# Patient Record
Sex: Female | Born: 1935 | Race: White | Hispanic: No | Smoking: Current every day smoker
Health system: Southern US, Community
[De-identification: ages and names within clinical notes are randomized; demographics above are authoritative.]

## PROBLEM LIST (undated history)

## (undated) DIAGNOSIS — H269 Unspecified cataract: Secondary | ICD-10-CM

## (undated) DIAGNOSIS — D649 Anemia, unspecified: Secondary | ICD-10-CM

## (undated) DIAGNOSIS — R768 Other specified abnormal immunological findings in serum: Secondary | ICD-10-CM

## (undated) DIAGNOSIS — K635 Polyp of colon: Secondary | ICD-10-CM

## (undated) DIAGNOSIS — M75101 Unspecified rotator cuff tear or rupture of right shoulder, not specified as traumatic: Secondary | ICD-10-CM

## (undated) DIAGNOSIS — J449 Chronic obstructive pulmonary disease, unspecified: Secondary | ICD-10-CM

## (undated) DIAGNOSIS — J479 Bronchiectasis, uncomplicated: Secondary | ICD-10-CM

## (undated) DIAGNOSIS — M4850XA Collapsed vertebra, not elsewhere classified, site unspecified, initial encounter for fracture: Secondary | ICD-10-CM

## (undated) DIAGNOSIS — Z8679 Personal history of other diseases of the circulatory system: Secondary | ICD-10-CM

## (undated) DIAGNOSIS — Z9889 Other specified postprocedural states: Secondary | ICD-10-CM

## (undated) DIAGNOSIS — R0902 Hypoxemia: Secondary | ICD-10-CM

## (undated) DIAGNOSIS — E7800 Pure hypercholesterolemia, unspecified: Secondary | ICD-10-CM

## (undated) DIAGNOSIS — E119 Type 2 diabetes mellitus without complications: Secondary | ICD-10-CM

## (undated) HISTORY — DX: Pure hypercholesterolemia, unspecified: E78.00

## (undated) HISTORY — DX: Unspecified rotator cuff tear or rupture of right shoulder, not specified as traumatic: M75.101

## (undated) HISTORY — DX: Polyp of colon: K63.5

## (undated) HISTORY — DX: Hypoxemia: R09.02

## (undated) HISTORY — PX: ROTATOR CUFF REPAIR: SHX139

## (undated) HISTORY — DX: Bronchiectasis, uncomplicated: J47.9

## (undated) HISTORY — PX: APPENDECTOMY: SHX54

## (undated) HISTORY — DX: Other specified postprocedural states: Z86.79

## (undated) HISTORY — DX: Other specified abnormal immunological findings in serum: R76.8

## (undated) HISTORY — DX: Other specified postprocedural states: Z98.890

## (undated) HISTORY — PX: ABDOMINAL HYSTERECTOMY: SHX81

## (undated) HISTORY — DX: Type 2 diabetes mellitus without complications: E11.9

## (undated) HISTORY — DX: Collapsed vertebra, not elsewhere classified, site unspecified, initial encounter for fracture: M48.50XA

## (undated) HISTORY — DX: Anemia, unspecified: D64.9

## (undated) HISTORY — DX: Chronic obstructive pulmonary disease, unspecified: J44.9

## (undated) HISTORY — DX: Unspecified cataract: H26.9

## (undated) HISTORY — PX: ABDOMINAL AORTIC ANEURYSM REPAIR: SUR1152

---

## 2004-06-01 ENCOUNTER — Ambulatory Visit: Payer: Self-pay

## 2004-07-13 ENCOUNTER — Ambulatory Visit: Payer: Self-pay

## 2005-04-11 ENCOUNTER — Ambulatory Visit: Payer: Self-pay | Admitting: Internal Medicine

## 2005-06-14 ENCOUNTER — Ambulatory Visit: Payer: Self-pay | Admitting: Internal Medicine

## 2005-06-28 ENCOUNTER — Ambulatory Visit: Payer: Self-pay | Admitting: Internal Medicine

## 2005-07-16 ENCOUNTER — Ambulatory Visit: Payer: Self-pay | Admitting: Internal Medicine

## 2005-10-23 ENCOUNTER — Ambulatory Visit: Payer: Self-pay | Admitting: Internal Medicine

## 2005-12-04 ENCOUNTER — Ambulatory Visit: Payer: Self-pay | Admitting: Ophthalmology

## 2006-01-14 ENCOUNTER — Ambulatory Visit: Payer: Self-pay | Admitting: Ophthalmology

## 2006-01-30 ENCOUNTER — Ambulatory Visit: Payer: Self-pay | Admitting: Internal Medicine

## 2006-02-09 ENCOUNTER — Other Ambulatory Visit: Payer: Self-pay

## 2006-02-09 ENCOUNTER — Emergency Department: Payer: Self-pay | Admitting: Emergency Medicine

## 2006-02-12 ENCOUNTER — Ambulatory Visit: Payer: Self-pay | Admitting: Internal Medicine

## 2006-02-13 ENCOUNTER — Other Ambulatory Visit: Payer: Self-pay

## 2006-02-13 ENCOUNTER — Inpatient Hospital Stay: Payer: Self-pay | Admitting: Internal Medicine

## 2006-02-17 ENCOUNTER — Emergency Department: Payer: Self-pay | Admitting: Unknown Physician Specialty

## 2006-02-17 ENCOUNTER — Other Ambulatory Visit: Payer: Self-pay

## 2006-02-18 ENCOUNTER — Ambulatory Visit: Payer: Self-pay | Admitting: Unknown Physician Specialty

## 2006-10-06 ENCOUNTER — Other Ambulatory Visit: Payer: Self-pay

## 2006-10-07 ENCOUNTER — Inpatient Hospital Stay: Payer: Self-pay | Admitting: Internal Medicine

## 2007-01-21 ENCOUNTER — Ambulatory Visit: Payer: Self-pay | Admitting: Internal Medicine

## 2007-01-26 ENCOUNTER — Ambulatory Visit: Payer: Self-pay | Admitting: Internal Medicine

## 2007-03-18 ENCOUNTER — Ambulatory Visit: Payer: Self-pay | Admitting: Specialist

## 2007-12-30 ENCOUNTER — Other Ambulatory Visit: Payer: Self-pay

## 2007-12-30 ENCOUNTER — Inpatient Hospital Stay: Payer: Self-pay | Admitting: Specialist

## 2008-01-22 ENCOUNTER — Ambulatory Visit: Payer: Self-pay | Admitting: Internal Medicine

## 2008-01-27 ENCOUNTER — Ambulatory Visit: Payer: Self-pay | Admitting: Internal Medicine

## 2008-06-22 ENCOUNTER — Inpatient Hospital Stay: Payer: Self-pay | Admitting: Specialist

## 2008-07-21 ENCOUNTER — Ambulatory Visit: Payer: Self-pay | Admitting: Specialist

## 2008-07-27 ENCOUNTER — Ambulatory Visit: Payer: Self-pay | Admitting: Specialist

## 2008-11-08 ENCOUNTER — Ambulatory Visit: Payer: Self-pay | Admitting: Internal Medicine

## 2009-02-08 ENCOUNTER — Ambulatory Visit: Payer: Self-pay | Admitting: Internal Medicine

## 2009-04-03 ENCOUNTER — Ambulatory Visit: Payer: Self-pay | Admitting: Urology

## 2009-12-21 ENCOUNTER — Inpatient Hospital Stay: Payer: Self-pay | Admitting: Internal Medicine

## 2010-04-04 ENCOUNTER — Ambulatory Visit: Payer: Self-pay | Admitting: Internal Medicine

## 2010-09-23 ENCOUNTER — Ambulatory Visit: Payer: Self-pay

## 2011-04-17 ENCOUNTER — Ambulatory Visit: Payer: Self-pay | Admitting: Internal Medicine

## 2011-07-02 LAB — HM MAMMOGRAPHY

## 2011-07-31 ENCOUNTER — Ambulatory Visit: Payer: Self-pay | Admitting: Internal Medicine

## 2012-05-18 ENCOUNTER — Ambulatory Visit: Payer: Self-pay | Admitting: Internal Medicine

## 2012-06-10 ENCOUNTER — Ambulatory Visit: Payer: Self-pay | Admitting: Internal Medicine

## 2012-06-30 ENCOUNTER — Encounter: Payer: Self-pay | Admitting: *Deleted

## 2012-06-30 ENCOUNTER — Other Ambulatory Visit: Payer: Self-pay | Admitting: *Deleted

## 2012-06-30 DIAGNOSIS — E119 Type 2 diabetes mellitus without complications: Secondary | ICD-10-CM

## 2012-06-30 DIAGNOSIS — J449 Chronic obstructive pulmonary disease, unspecified: Secondary | ICD-10-CM

## 2012-06-30 DIAGNOSIS — J479 Bronchiectasis, uncomplicated: Secondary | ICD-10-CM

## 2012-06-30 DIAGNOSIS — M810 Age-related osteoporosis without current pathological fracture: Secondary | ICD-10-CM

## 2012-06-30 DIAGNOSIS — Z86010 Personal history of colonic polyps: Secondary | ICD-10-CM

## 2012-06-30 DIAGNOSIS — E7800 Pure hypercholesterolemia, unspecified: Secondary | ICD-10-CM

## 2012-06-30 DIAGNOSIS — D649 Anemia, unspecified: Secondary | ICD-10-CM

## 2012-07-01 ENCOUNTER — Ambulatory Visit (INDEPENDENT_AMBULATORY_CARE_PROVIDER_SITE_OTHER): Payer: Medicare Other | Admitting: Internal Medicine

## 2012-07-01 ENCOUNTER — Inpatient Hospital Stay: Payer: Self-pay | Admitting: Internal Medicine

## 2012-07-01 ENCOUNTER — Encounter: Payer: Self-pay | Admitting: Internal Medicine

## 2012-07-01 ENCOUNTER — Encounter: Payer: Self-pay | Admitting: *Deleted

## 2012-07-01 VITALS — BP 118/68 | HR 92 | Temp 98.1°F | Ht 61.0 in | Wt 120.2 lb

## 2012-07-01 DIAGNOSIS — J449 Chronic obstructive pulmonary disease, unspecified: Secondary | ICD-10-CM | POA: Insufficient documentation

## 2012-07-01 DIAGNOSIS — J479 Bronchiectasis, uncomplicated: Secondary | ICD-10-CM

## 2012-07-01 DIAGNOSIS — D649 Anemia, unspecified: Secondary | ICD-10-CM

## 2012-07-01 DIAGNOSIS — E7800 Pure hypercholesterolemia, unspecified: Secondary | ICD-10-CM

## 2012-07-01 DIAGNOSIS — E119 Type 2 diabetes mellitus without complications: Secondary | ICD-10-CM | POA: Insufficient documentation

## 2012-07-01 DIAGNOSIS — Z860100 Personal history of colon polyps, unspecified: Secondary | ICD-10-CM

## 2012-07-01 DIAGNOSIS — R0989 Other specified symptoms and signs involving the circulatory and respiratory systems: Secondary | ICD-10-CM

## 2012-07-01 DIAGNOSIS — M810 Age-related osteoporosis without current pathological fracture: Secondary | ICD-10-CM

## 2012-07-01 LAB — CBC WITH DIFFERENTIAL/PLATELET
Eosinophil #: 0 10*3/uL (ref 0.0–0.7)
Eosinophil %: 0.5 %
HCT: 40.9 % (ref 35.0–47.0)
Lymphocyte #: 0.7 10*3/uL — ABNORMAL LOW (ref 1.0–3.6)
MCV: 91 fL (ref 80–100)
Monocyte %: 5 %
Neutrophil #: 7.7 10*3/uL — ABNORMAL HIGH (ref 1.4–6.5)
RBC: 4.49 10*6/uL (ref 3.80–5.20)
WBC: 9 10*3/uL (ref 3.6–11.0)

## 2012-07-01 LAB — BASIC METABOLIC PANEL
Anion Gap: 4 — ABNORMAL LOW (ref 7–16)
Calcium, Total: 9.2 mg/dL (ref 8.5–10.1)
Co2: 29 mmol/L (ref 21–32)
EGFR (African American): >60
Osmolality: 284 (ref 275–301)
Potassium: 3.7 mmol/L (ref 3.5–5.1)
Sodium: 140 mmol/L (ref 136–145)

## 2012-07-01 MED ORDER — ALBUTEROL SULFATE (2.5 MG/3ML) 0.083% IN NEBU
2.5000 mg | INHALATION_SOLUTION | Freq: Once | RESPIRATORY_TRACT | Status: AC
  Administered 2012-07-01: 2.5 mg via RESPIRATORY_TRACT

## 2012-07-02 LAB — CBC WITH DIFFERENTIAL/PLATELET
Basophil #: 0 10*3/uL (ref 0.0–0.1)
Basophil %: 0.2 %
Eosinophil #: 0 10*3/uL (ref 0.0–0.7)
Eosinophil %: 0 %
Lymphocyte #: 0.5 10*3/uL — ABNORMAL LOW (ref 1.0–3.6)
MCH: 32.8 pg (ref 26.0–34.0)
Monocyte #: 0.2 x10 3/mm (ref 0.2–0.9)
Monocyte %: 1.8 %
Neutrophil %: 92.1 %
Platelet: 191 10*3/uL (ref 150–440)
RBC: 4.26 10*6/uL (ref 3.80–5.20)
WBC: 8.4 10*3/uL (ref 3.6–11.0)

## 2012-07-02 LAB — BASIC METABOLIC PANEL
BUN: 11 mg/dL (ref 7–18)
Chloride: 106 mmol/L (ref 98–107)
Co2: 23 mmol/L (ref 21–32)
Creatinine: 0.69 mg/dL (ref 0.60–1.30)
EGFR (African American): >60
EGFR (Non-African Amer.): >60
Glucose: 183 mg/dL — ABNORMAL HIGH (ref 65–99)
Osmolality: 278 (ref 275–301)
Potassium: 4.1 mmol/L (ref 3.5–5.1)
Sodium: 137 mmol/L (ref 136–145)

## 2012-07-02 LAB — PRO B NATRIURETIC PEPTIDE: B-Type Natriuretic Peptide: 120 pg/mL (ref 0–450)

## 2012-07-02 LAB — HEMOGLOBIN A1C: Hemoglobin A1C: 6.7 % — ABNORMAL HIGH (ref 4.2–6.3)

## 2012-07-10 ENCOUNTER — Encounter: Payer: Self-pay | Admitting: Adult Health

## 2012-07-10 ENCOUNTER — Encounter: Payer: Self-pay | Admitting: Internal Medicine

## 2012-07-10 ENCOUNTER — Ambulatory Visit (INDEPENDENT_AMBULATORY_CARE_PROVIDER_SITE_OTHER): Payer: Medicare Other | Admitting: Adult Health

## 2012-07-10 VITALS — BP 143/80 | HR 101 | Temp 98.0°F | Ht 62.0 in | Wt 123.0 lb

## 2012-07-10 DIAGNOSIS — J449 Chronic obstructive pulmonary disease, unspecified: Secondary | ICD-10-CM

## 2012-07-10 DIAGNOSIS — B370 Candidal stomatitis: Secondary | ICD-10-CM

## 2012-07-10 MED ORDER — NYSTATIN 100000 UNIT/ML MT SUSP: 500000.0000 [IU] | Freq: Four times a day (QID) | OROMUCOSAL | Status: DC

## 2012-07-10 NOTE — Assessment & Plan Note (Signed)
Pt presents with increased cough, congestion, wheezing and sob.  COPD exacerbation and concern over possible overlying infection.  Not responding to outpatient abx, prednisone and current inhaler/neb regimen.  I do feel she warrants hospitalization.  Discussed with the patient and her daughter.  Also discussed with the hospitalist and they agreed to accept the patient for a direct admission.  Pt subsequently send over for direct admission.

## 2012-07-10 NOTE — Progress Notes (Signed)
Subjective:    Patient ID: Carla Harris, female    DOB: 1928-11-04, 78 y.o.   MRN: [id number]  HPI 78 year old female with past history of COPD/bronchiectasis, anemia and hypercholesterolemia who comes in today for a scheduled follow up.  She states that starting 10 days ago, she noticed worsening respiratory symptoms.  She sees Dr Meredeth Ide regularly.  She has recently been on abx.  Also started Daliresp.  Is no Prednisone 20mg  daily now.  She is due to follow up with Dr Meredeth Ide on 07/13/12.  She is still having increased cough and wheezing.  Increased sob.  Feels tight, especially in the am.  Has already used her nebulizer twice this am.  Has been using this several times per day.  She states she is tired.  Some increased anxiety with the increased sob.  No nausea or vomiting.   Past Medical History  Diagnosis Date  . Anemia   . Diabetes mellitus without complication   . Bronchiectasis   . Colon polyps   . Hypercholesteremia   . S/P AAA repair   . Right rotator cuff tear   . COPD (chronic obstructive pulmonary disease)   . Low serum IgG1 and IgM levels     Current Outpatient Prescriptions on File Prior to Visit  Medication Sig Dispense Refill  . acetaminophen (TYLENOL) 500 MG tablet Take 500 mg by mouth every 6 (six) hours as needed.      Marland Kitchen albuterol (PROVENTIL HFA;VENTOLIN HFA) 108 (90 BASE) MCG/ACT inhaler Inhale 1 puff into the lungs every 6 (six) hours as needed.      Marland Kitchen albuterol-ipratropium (COMBIVENT) 18-103 MCG/ACT inhaler Inhale 2 puffs into the lungs every 6 (six) hours as needed.      Marland Kitchen aspirin EC 81 MG tablet Take 81 mg by mouth daily.      . cetirizine (ZYRTEC) 10 MG tablet Take 10 mg by mouth daily.      . Cholecalciferol (VITAMIN D3) 2000 UNITS capsule Take 2,000 Units by mouth daily.      Marland Kitchen esomeprazole (NEXIUM) 40 MG capsule Take 40 mg by mouth daily before breakfast. Take 1 capsule twice a day      . Fluticasone-Salmeterol (ADVAIR) 250-50 MCG/DOSE AEPB Inhale 1 puff into  the lungs every 12 (twelve) hours.      . metFORMIN (GLUCOPHAGE) 500 MG tablet Take 500 mg by mouth 2 (two) times daily with a meal. Two times a day with meal.      . montelukast (SINGULAIR) 10 MG tablet Take 10 mg by mouth at bedtime.      . pravastatin (PRAVACHOL) 20 MG tablet Take 20 mg by mouth daily.      . theophylline (THEODUR) 200 MG 12 hr tablet Take 200 mg by mouth 2 (two) times daily.        Review of Systems Patient denies any headache, lightheadedness or dizziness.  No significant sinus or allergy symptoms.  No chest pain.  She does report the increased chest tightness and sob.  Increased wheezing.  Increased cough and congestion.  Symptoms worse in the morning. Has already been on abx and is taking prednisone daily.  No nausea or vomiting.  No abdominal pain or cramping.  No bowel change, such as diarrhea, constipation, BRBPR or melana.  No urine change.  Some anxiety especially notices when she is having difficulty breathing.      Objective:   Physical Exam Filed Vitals:   07/01/12 1116  BP: 118/68  Pulse: 92  Temp: 98.1 F (18.28 C)   78 year old female in mild respiratory distress.   HEENT:  Nares - clear.  OP- without lesions or erythema.  NECK:  Supple, nontender.     HEART:  Appears to be regular. LUNGS:  Respirations slightly labored.  Increased congestion.  Increased cough with expiration and especially with forced expiration.  Increased wheezing.    RADIAL PULSE:  Equal bilaterally.  ABDOMEN:  Soft, nontender.     EXTREMITIES:  No increased edema to be present.                     Assessment & Plan:  SMOKING CESSATION.  Again discussed with her today the need to quit.  She desires not to quit at this time.    PREVIOUS WEIGHT LOSS.  Weight last visit 124.  Today 120.  Treat the current flare.  Follow.  Has declined further w/up.   CARDIOVASCULAR.  Stable.    INCREASED PSYCHOSOCIAL STRESSORS.  Feels she is handling things relatively well.  Follow.    HISTORY  OF AAA REPAIR.  Last ultrasound 2010.  Dr Maye Hides reviewed.  He felt no further w/up for several years - then follow up with ultrasound.  (phone Dr Maye Hides - [phone]).    HEALTH MAINTENANCE.  Physical 12/05/11.  She declined GU and rectal exam.  Declined GI evaluation.  Will notify me when agreeable for bone density.  Mammogram 04/17/11 - BiRads II.

## 2012-07-10 NOTE — Progress Notes (Signed)
Subjective:    Patient ID: Carla Harris, female    DOB: 1929-01-11, 78 y.o.   MRN: [id number]  HPI  Patient is a pleasant 78 y/o patient with hx of COPD, bronchiectasis, HLD, DM type II, who presents to clinic today for f/u of recent hospitalization at [facility] from 06/29/12 - 07/03/12 for acute on chronic respiratory failure 2/2 COPD exacerbation. She reports feeling well. She has a chronic cough. She reports continued tobacco abuse and has weaned herself down to 6 cigarettes daily. She is currently on O2 nasal cannula and reports mainly using it during the night and when she goes out. She experience shortness of breath with exertion. She denies CP, fever, chills.   Current Outpatient Prescriptions on File Prior to Visit  Medication Sig Dispense Refill  . acetaminophen (TYLENOL) 500 MG tablet Take 500 mg by mouth every 6 (six) hours as needed.      Marland Kitchen albuterol (PROVENTIL HFA;VENTOLIN HFA) 108 (90 BASE) MCG/ACT inhaler Inhale 1 puff into the lungs every 6 (six) hours as needed.      Marland Kitchen albuterol-ipratropium (COMBIVENT) 18-103 MCG/ACT inhaler Inhale 2 puffs into the lungs every 6 (six) hours as needed.      Marland Kitchen aspirin EC 81 MG tablet Take 81 mg by mouth daily.      . cetirizine (ZYRTEC) 10 MG tablet Take 10 mg by mouth daily.      . Cholecalciferol (VITAMIN D3) 2000 UNITS capsule Take 2,000 Units by mouth daily.      Marland Kitchen esomeprazole (NEXIUM) 40 MG capsule Take 40 mg by mouth daily before breakfast. Take 1 capsule twice a day      . Fluticasone-Salmeterol (ADVAIR) 250-50 MCG/DOSE AEPB Inhale 1 puff into the lungs every 12 (twelve) hours.      . metFORMIN (GLUCOPHAGE) 500 MG tablet Take 500 mg by mouth 2 (two) times daily with a meal. Two times a day with meal.      . montelukast (SINGULAIR) 10 MG tablet Take 10 mg by mouth at bedtime.      . pravastatin (PRAVACHOL) 20 MG tablet Take 20 mg by mouth daily.      . predniSONE (DELTASONE) 20 MG tablet Take 20 mg by mouth daily. Take 3 tablets daily      .  theophylline (THEODUR) 200 MG 12 hr tablet Take 200 mg by mouth 2 (two) times daily.          Review of Systems  Constitutional: Negative for fever, chills, appetite change and fatigue.  HENT: Positive for sore throat.   Eyes: Negative.   Respiratory: Positive for cough, shortness of breath and wheezing.   Cardiovascular: Negative for chest pain.  Gastrointestinal: Negative for nausea, vomiting and diarrhea.  Genitourinary: Negative for dysuria and flank pain.  Skin: Negative for rash.  Neurological: Positive for headaches. Negative for dizziness, weakness and light-headedness.  Psychiatric/Behavioral: Negative.      BP 143/80  Pulse 101  Temp 98 F (36.7 C) (Oral)  Ht 5\' 2"  (1.575 m)  Wt 123 lb (55.792 kg)  BMI 22.50 kg/m2  SpO2 98%  LMP 07/01/1969     Objective:   Physical Exam  Constitutional: She is oriented to person, place, and time. She appears well-developed and well-nourished. No distress.  HENT:  Head: Normocephalic.       Oropharyngeal thrush  Neck: No tracheal deviation present.  Cardiovascular: Normal rate, regular rhythm and normal heart sounds.   No murmur heard. Pulmonary/Chest: No respiratory distress. She has wheezes.  Rhonchi posterior bilateral upper fields  Abdominal: Bowel sounds are normal. She exhibits no distension. There is no tenderness.  Lymphadenopathy:    She has no cervical adenopathy.  Neurological: She is alert and oriented to person, place, and time. Coordination normal.  Skin: Skin is warm and dry. No rash noted.  Psychiatric: She has a normal mood and affect. Her behavior is normal. Thought content normal.       Assessment & Plan:

## 2012-07-10 NOTE — Assessment & Plan Note (Addendum)
Low cholesterol diet and exercise.  Check lipid profile.   

## 2012-07-10 NOTE — Assessment & Plan Note (Signed)
Declines GI evaluation.   

## 2012-07-10 NOTE — Assessment & Plan Note (Signed)
Recent hospitalization for acute on chronic respiratory failure secondary to COPD exacerbation. Discharge medications reviewed and updated. Oxygen currently in use. Patient is still smoking. Advised to not smoke with oxygen in use. Patient is currently taking Prednisone 60 mg daily. Instructed not to stop this medication abruptly. She will be following with her pulmonologist, Dr. Meredeth Ide, on Monday.

## 2012-07-10 NOTE — Assessment & Plan Note (Signed)
Sugars have been well controlled.  Will need follow up met b and a1c.   

## 2012-07-10 NOTE — Assessment & Plan Note (Signed)
Declines follow up bone density.  Continue calcium and vitamin D.   

## 2012-07-10 NOTE — Assessment & Plan Note (Signed)
Is followed by Dr Meredeth Ide.  Treat current COPD flare as outlined.

## 2012-07-10 NOTE — Assessment & Plan Note (Signed)
Suspect this is from oral steroid inhaler. Patient has not been rinsing mouth. Instructed importance of rinsing thoroughly after each use. Start Nystatin swish and swallow.

## 2012-07-10 NOTE — Patient Instructions (Signed)
  Follow your new medication list you were sent from the hospital with.   Take the prednisone 20mg  tablet (3 tablets daily). Take this medication in the morning so that it does not affect your sleep. You have 10 mg tablets so you will need to take 6 tablets. This medication cannot be stopped abruptly.  Rinse your mouth thoroughly after using Advair. You have some thrush from the inhaler. I am ordering some nystatin swish and swallow to help with the thrush.

## 2012-07-10 NOTE — Assessment & Plan Note (Signed)
Follow cbc.  

## 2012-07-13 ENCOUNTER — Other Ambulatory Visit: Payer: Self-pay | Admitting: Internal Medicine

## 2012-07-13 NOTE — Telephone Encounter (Signed)
Alprazolam 0.25 mg tab   Take 1 tablet twice daily as needed  # 40

## 2012-07-14 MED ORDER — ALPRAZOLAM 0.25 MG PO TABS: 0.2500 mg | ORAL_TABLET | Freq: Two times a day (BID) | ORAL | Status: DC | PRN

## 2012-07-14 NOTE — Telephone Encounter (Signed)
Prescription called in to pharmacy

## 2012-09-14 ENCOUNTER — Encounter: Payer: Self-pay | Admitting: Internal Medicine

## 2012-09-14 ENCOUNTER — Ambulatory Visit (INDEPENDENT_AMBULATORY_CARE_PROVIDER_SITE_OTHER): Payer: Medicare Other | Admitting: Internal Medicine

## 2012-09-14 VITALS — BP 128/68 | HR 97 | Temp 98.6°F | Wt 123.0 lb

## 2012-09-14 DIAGNOSIS — R5381 Other malaise: Secondary | ICD-10-CM

## 2012-09-14 DIAGNOSIS — Z860100 Personal history of colon polyps, unspecified: Secondary | ICD-10-CM

## 2012-09-14 DIAGNOSIS — J449 Chronic obstructive pulmonary disease, unspecified: Secondary | ICD-10-CM

## 2012-09-14 DIAGNOSIS — E7800 Pure hypercholesterolemia, unspecified: Secondary | ICD-10-CM

## 2012-09-14 DIAGNOSIS — J479 Bronchiectasis, uncomplicated: Secondary | ICD-10-CM

## 2012-09-14 DIAGNOSIS — E119 Type 2 diabetes mellitus without complications: Secondary | ICD-10-CM

## 2012-09-14 DIAGNOSIS — D649 Anemia, unspecified: Secondary | ICD-10-CM

## 2012-09-14 DIAGNOSIS — M810 Age-related osteoporosis without current pathological fracture: Secondary | ICD-10-CM

## 2012-09-14 MED ORDER — AZITHROMYCIN 250 MG PO TABS: ORAL_TABLET | ORAL | Status: DC

## 2012-09-14 MED ORDER — PREDNISONE 10 MG PO TABS: ORAL_TABLET | ORAL | Status: DC

## 2012-09-15 ENCOUNTER — Encounter: Payer: Self-pay | Admitting: Internal Medicine

## 2012-09-15 NOTE — Assessment & Plan Note (Signed)
Low cholesterol diet and exercise.  Check lipid profile.

## 2012-09-15 NOTE — Assessment & Plan Note (Signed)
Is followed by Dr Meredeth Ide.  Treat current infection with Zpak as directed.  Prednisone taper as directed.

## 2012-09-15 NOTE — Assessment & Plan Note (Signed)
Treat current infection with a Zpak as directed. Prednisone taper as directed.  Continue nebs and inhalers as she is doing.  Continue follow up with Dr Meredeth Ide.  She knows to continue her daily prednisone after she completes the taper.

## 2012-09-15 NOTE — Progress Notes (Signed)
Subjective:    Patient ID: Carla Harris, female    DOB: August 28, 1928, 79 y.o.   MRN: [id number]  Cough  79 year old female with past history of COPD/bronchiectasis, anemia and hypercholesterolemia who comes in today for a scheduled follow up.  She reports that over the last two weeks, she has noticed increased cough and congestion.  Hoarseness.  Ears roaring at times.  Some increased chest congestion.  Not as tight.  Has been using her nebulizer at home.  Saw Dr Meredeth Ide last week.  Started on Performist.  Still with symptoms despite frequent neb treatments.  States she is eating and drinking relatively well.  No nausea or vomiting.  No bowel change.  Sugars have done well.     Past Medical History  Diagnosis Date  . Anemia   . Diabetes mellitus without complication   . Bronchiectasis   . Colon polyps   . Hypercholesteremia   . S/P AAA repair   . Right rotator cuff tear   . COPD (chronic obstructive pulmonary disease)   . Low serum IgG1 and IgM levels     Current Outpatient Prescriptions on File Prior to Visit  Medication Sig Dispense Refill  . albuterol-ipratropium (COMBIVENT) 18-103 MCG/ACT inhaler Inhale 2 puffs into the lungs every 6 (six) hours as needed.      . ALPRAZolam (XANAX) 0.25 MG tablet Take 1 tablet (0.25 mg total) by mouth 2 (two) times daily as needed.  40 tablet  0  . aspirin EC 81 MG tablet Take 81 mg by mouth daily.      . cetirizine (ZYRTEC) 10 MG tablet Take 10 mg by mouth daily.      . Cholecalciferol (VITAMIN D3) 2000 UNITS capsule Take 2,000 Units by mouth daily.      Marland Kitchen esomeprazole (NEXIUM) 40 MG capsule Take 40 mg by mouth daily before breakfast. Take 1 capsule twice a day      . ipratropium-albuterol (DUONEB) 0.5-2.5 (3) MG/3ML SOLN Take 3 mLs by nebulization every 6 (six) hours as needed.      . metFORMIN (GLUCOPHAGE) 500 MG tablet Take 500 mg by mouth 2 (two) times daily with a meal. Two times a day with meal.      . montelukast (SINGULAIR) 10 MG tablet Take  10 mg by mouth at bedtime.      Marland Kitchen nystatin (MYCOSTATIN) 100000 UNIT/ML suspension Take 5 mLs (500,000 Units total) by mouth 4 (four) times daily.  60 mL  0  . pravastatin (PRAVACHOL) 20 MG tablet Take 20 mg by mouth daily.      . predniSONE (DELTASONE) 20 MG tablet Take 20 mg by mouth daily. Take 3 tablets daily      . theophylline (THEODUR) 200 MG 12 hr tablet Take 200 mg by mouth 2 (two) times daily.      Marland Kitchen acetaminophen (TYLENOL) 500 MG tablet Take 500 mg by mouth every 6 (six) hours as needed.      . Fluticasone-Salmeterol (ADVAIR) 250-50 MCG/DOSE AEPB Inhale 1 puff into the lungs every 12 (twelve) hours.       No current facility-administered medications on file prior to visit.    Review of Systems  Respiratory: Positive for cough.   Patient denies any headache, lightheadedness or dizziness.  Reports some nasal congestion and roaring in her ears.  No chest pain.  She does report the increased chest congestion and cough.  Some wheezing.   Using her nebs regularly.  No nausea or vomiting.  No  abdominal pain or cramping.  No bowel change, such as diarrhea, constipation, BRBPR or melana.  No urine change.       Objective:   Physical Exam  Filed Vitals:   09/14/12 0901  BP: 128/68  Pulse: 97  Temp: 98.6 F (4 C)   79 year old female in no acute distress.    HEENT:  Nares - clear - slightly erythematous turbinates.  OP- without lesions or erythema.  TMs visualized - without erythema.  NECK:  Supple, nontender.     HEART:  Appears to be regular. LUNGS:  Increased air movement.  Some increased congestion - cleared mostly with coughing. Some increased cough with forced expiration.      RADIAL PULSE:  Equal bilaterally.  ABDOMEN:  Soft, nontender.     EXTREMITIES:  No increased edema to be present.                     Assessment & Plan:  SMOKING CESSATION.  Have discussed with her today the need to quit.  She desires not to quit at this time.    PREVIOUS WEIGHT LOSS.  Weight last  visit 124.  Today 123.  Follow.  Has declined further w/up.   CARDIOVASCULAR.  Stable.    INCREASED PSYCHOSOCIAL STRESSORS.  Feels she is handling things relatively well.  Follow.    HISTORY OF AAA REPAIR.  Last ultrasound 2010.  Dr Maye Hides reviewed.  He felt no further w/up for several years - then follow up with ultrasound.  (phone Dr Maye Hides - [phone]).    HEALTH MAINTENANCE.  Physical 12/05/11.  She declined GU and rectal exam.  Declined GI evaluation.  Will notify me when agreeable for bone density.  Mammogram 04/17/11 - BiRads II.

## 2012-09-15 NOTE — Assessment & Plan Note (Signed)
Follow cbc.  

## 2012-09-15 NOTE — Assessment & Plan Note (Signed)
Declines GI evaluation.   

## 2012-09-15 NOTE — Assessment & Plan Note (Signed)
Sugars have been well controlled.  Will need follow up met b and a1c.   

## 2012-09-15 NOTE — Assessment & Plan Note (Signed)
Declines follow up bone density.  Continue calcium and vitamin D.   

## 2012-09-17 ENCOUNTER — Telehealth: Payer: Self-pay | Admitting: Internal Medicine

## 2012-09-17 NOTE — Telephone Encounter (Signed)
Patient Information:  Caller Name: Shawnette  Phone: [phone]  Patient: Carla Harris,   Gender: Female  DOB: 07-Jul-1929  Age: 79 Years  PCP: Dale Thebes  Office Follow Up:  Does the office need to follow up with this patient?: Yes  Instructions For The Office: Requesting Doxyclline/ Allergic to Zpack  RN Note:  Requesting Doxyclline. Advised to stop Zpack.  Symptoms  Reason For Call & Symptoms: Seen in office on 09/14/12-Prednisone Increased and started Zpack. She is having trouble with ankle feeling itchy and swelling- onset 09/16/12.  Last year at end of December she had to be taken off of  Zpack due to ankles swelling and was switched to Doxyclline. She thinks she is having similar reaction. She speaks in phrazes, has wheezing. Reports that breathing is not much better but coughing up phlegm. Afebrile. Due to use Nebulizer, which she reports helps with breathing.  Reviewed Health History In EMR: Yes  Reviewed Medications In EMR: Yes  Reviewed Allergies In EMR: Yes  Reviewed Surgeries / Procedures: Yes  Date of Onset of Symptoms: 09/16/2012  Guideline(s) Used:  Rash or Redness - Localized  Breathing Difficulty  Asthma Attack  Disposition Per Guideline:   See Today or Tomorrow in Office  Reason For Disposition Reached:   Mild asthma attack (e.g., no SOB at rest, mild SOB with walking, speaks normally in sentences, mild wheezing) and persists > 24 hours on appropriate treatment  Advice Given:  Hydrocortisone Cream for Itching:   Keep the cream in the refrigerator (Reason: it feels better if applied cold).  Available over-the-counter in [place] as 0.5% and 1% cream.  Available over-the-counter in [place] as 0.5% cream.  CAUTION: Do not use hydrocortisone cream on suspected athlete's foot, jock itch, ringworm, or impetigo.  Avoid Scratching:  Try not to scratch. Cut your fingernails short.  Contagiousness:  Adults with localized rashes do not need to miss any work or school.  Expected Course:  Most of these rashes pass in 2 to 3 days.  Call Back If:  Rash spreads or becomes worse  Rash lasts longer than 1 week  You become worse.  Quick-Relief Asthma Medicine:   Start your quick-relief medicine (e.g., albuterol, salbutamol) at the first sign of any coughing or shortness of breath (don't wait for wheezing). Use your inhaler (2 puffs each time) or nebulizer every 4 hours. Continue the quick-relief medicine until you have not wheezed or coughed for 48 hours.  The best "cough medicine" for an adult with asthma is always the asthma medicine (Note: Don't use cough suppressants, but cough drops may help a tickly cough).  Long-Term-Control Asthma Medicine:  If you are using a controller medicine (e.g., inhaled steroids or cromolyn), continue to take it as directed.  Drinking Liquids:  Try to drink normal amount of liquids (e.g., water). Being adequately hydrated makes it easier to cough up the sticky lung mucus.  Humidifier:   If the air is dry, use a cool mist humidifier to prevent drying of the upper airway.  Avoid Triggers:  Avoid known triggers of asthma attacks (e.g., tobacco smoke, cats, other pets, feather pillows, exercise).  Expected Course:  If treatment is started early, most asthma attacks are quickly brought under control. All wheezing should be gone by 5 days.  Call Back If:  Inhaled asthma medicine (nebulizer or inhaler) is needed more often than every 4 hours  Wheezing has not completely cleared after 5 days  You become worse.  Patient Refused Recommendation:  Patient  Requests Prescription  Allergic to Zpack- Requesting Doxyclline be called to Pharmacy

## 2012-09-17 NOTE — Telephone Encounter (Signed)
Called pt and she reported that her breathing is stable.  She was questioning a possible allergic reaction to zpak.  Has tolerated doxycycline.  Will change to doxycycline 100mg  bid x 10 days.  Called in to [facility].  Will deliver to pt. Pt will call or be reevaluated if symptoms change or progress.

## 2012-10-01 ENCOUNTER — Encounter: Payer: Self-pay | Admitting: Internal Medicine

## 2012-12-15 ENCOUNTER — Ambulatory Visit (INDEPENDENT_AMBULATORY_CARE_PROVIDER_SITE_OTHER): Payer: Medicare Other | Admitting: Internal Medicine

## 2012-12-15 ENCOUNTER — Encounter: Payer: Self-pay | Admitting: Internal Medicine

## 2012-12-15 VITALS — BP 120/70 | HR 101 | Temp 98.6°F | Ht 62.0 in | Wt 121.5 lb

## 2012-12-15 DIAGNOSIS — E7800 Pure hypercholesterolemia, unspecified: Secondary | ICD-10-CM

## 2012-12-15 DIAGNOSIS — M810 Age-related osteoporosis without current pathological fracture: Secondary | ICD-10-CM

## 2012-12-15 DIAGNOSIS — J4489 Other specified chronic obstructive pulmonary disease: Secondary | ICD-10-CM

## 2012-12-15 DIAGNOSIS — D649 Anemia, unspecified: Secondary | ICD-10-CM

## 2012-12-15 DIAGNOSIS — Z860100 Personal history of colon polyps, unspecified: Secondary | ICD-10-CM

## 2012-12-15 DIAGNOSIS — E119 Type 2 diabetes mellitus without complications: Secondary | ICD-10-CM

## 2012-12-15 DIAGNOSIS — J449 Chronic obstructive pulmonary disease, unspecified: Secondary | ICD-10-CM

## 2012-12-15 DIAGNOSIS — J479 Bronchiectasis, uncomplicated: Secondary | ICD-10-CM

## 2012-12-15 NOTE — Progress Notes (Signed)
Subjective:    Patient ID: Carla Harris, female    DOB: [birth year], 79 y.o.   MRN: [id number]  Cough  79 year old female with past history of COPD/bronchiectasis, anemia and hypercholesterolemia who comes in today for a scheduled follow up.  States since she has been on Performist, she feels more "jittery".  If she does not use performist - symptoms improve.  Since being prescribed performist, she hs not noticed any change in her breathing.  She is still smoking.  We discussed the need to quit today.   States she is eating and drinking relatively well.  No nausea or vomiting.  No bowel change.  Sugars have done well.     Past Medical History  Diagnosis Date  . Anemia   . Diabetes mellitus without complication   . Bronchiectasis   . Colon polyps   . Hypercholesteremia   . S/P AAA repair   . Right rotator cuff tear   . COPD (chronic obstructive pulmonary disease)   . Low serum IgG1 and IgM levels     Current Outpatient Prescriptions on File Prior to Visit  Medication Sig Dispense Refill  . acetaminophen (TYLENOL) 500 MG tablet Take 500 mg by mouth every 6 (six) hours as needed.      Marland Kitchen albuterol-ipratropium (COMBIVENT) 18-103 MCG/ACT inhaler Inhale 2 puffs into the lungs every 6 (six) hours as needed.      . ALPRAZolam (XANAX) 0.25 MG tablet Take 1 tablet (0.25 mg total) by mouth 2 (two) times daily as needed.  40 tablet  0  . aspirin EC 81 MG tablet Take 81 mg by mouth daily.      . cetirizine (ZYRTEC) 10 MG tablet Take 10 mg by mouth daily.      . Cholecalciferol (VITAMIN D3) 2000 UNITS capsule Take 2,000 Units by mouth daily.      Marland Kitchen esomeprazole (NEXIUM) 40 MG capsule Take 40 mg by mouth daily before breakfast. Take 1 capsule twice a day      . Fluticasone-Salmeterol (ADVAIR) 250-50 MCG/DOSE AEPB Inhale 1 puff into the lungs every 12 (twelve) hours.      . formoterol (PERFOROMIST) 20 MCG/2ML nebulizer solution Take 20 mcg by nebulization 2 (two) times daily.      Marland Kitchen  ipratropium-albuterol (DUONEB) 0.5-2.5 (3) MG/3ML SOLN Take 3 mLs by nebulization every 6 (six) hours as needed.      . metFORMIN (GLUCOPHAGE) 500 MG tablet Take 500 mg by mouth 2 (two) times daily with a meal. Two times a day with meal.      . montelukast (SINGULAIR) 10 MG tablet Take 10 mg by mouth at bedtime.      Marland Kitchen nystatin (MYCOSTATIN) 100000 UNIT/ML suspension Take 5 mLs (500,000 Units total) by mouth 4 (four) times daily.  60 mL  0  . pravastatin (PRAVACHOL) 20 MG tablet Take 20 mg by mouth daily.      . predniSONE (DELTASONE) 20 MG tablet Take 20 mg by mouth daily. Take 3 tablets daily      . theophylline (THEODUR) 200 MG 12 hr tablet Take 200 mg by mouth 2 (two) times daily.       No current facility-administered medications on file prior to visit.    Review of Systems  Respiratory: Positive for cough.   Patient denies any headache, lightheadedness or dizziness.  No significant sinus or allergy symptoms.  No chest pain.  Still some cough.  Using her nebs regularly.  No nausea or vomiting.  No  abdominal pain or cramping.  No bowel change, such as diarrhea, constipation, BRBPR or melana.  No urine change.  Feels more jittery since using the performist.       Objective:   Physical Exam  Filed Vitals:   12/15/12 0858  BP: 120/70  Pulse: 101  Temp: 98.6 F (37 C)   Pulse recheck 58  80 year old female in no acute distress.    HEENT:  Nares - clear.  OP- without lesions or erythema.   NECK:  Supple, nontender.     HEART:  Appears to be regular. LUNGS:  Increased air movement.  Some cough.  No significant wheezing.       RADIAL PULSE:  Equal bilaterally.  ABDOMEN:  Soft, nontender.     EXTREMITIES:  No increased edema to be present.                     Assessment & Plan:  SMOKING CESSATION.  Have discussed with her today the need to quit.  She desires not to quit at this time.    PREVIOUS WEIGHT LOSS.  Weight last visit 123.  Today 121.  Follow.  Has declined further w/up.    CARDIOVASCULAR.  Stable.    INCREASED PSYCHOSOCIAL STRESSORS.  Feels she is handling things relatively well.  Follow.    HISTORY OF AAA REPAIR.  Last ultrasound 2010.  Dr Maye Hides reviewed.  He felt no further w/up for several years - then follow up with ultrasound.  (phone Dr Maye Hides - [phone]).    HEALTH MAINTENANCE.  Physical 12/05/11.  She declined GU and rectal exam.  Declined GI evaluation.  Will notify me when agreeable for bone density.  Mammogram 04/17/11 - BiRads II.  Declines to be scheduled for another mammogram.

## 2012-12-17 ENCOUNTER — Telehealth: Payer: Self-pay | Admitting: *Deleted

## 2012-12-17 NOTE — Telephone Encounter (Signed)
Pt forgot to mention at her last visit on 6/3, that she saw a audioligist and was dx with tinnitus (can not be treated). She also spoke with the pulminologist & she is okay to go back on old meds

## 2012-12-21 ENCOUNTER — Encounter: Payer: Self-pay | Admitting: Internal Medicine

## 2012-12-21 NOTE — Assessment & Plan Note (Signed)
Is followed by Dr Meredeth Ide.

## 2012-12-21 NOTE — Assessment & Plan Note (Signed)
Declines GI evaluation.   

## 2012-12-21 NOTE — Assessment & Plan Note (Signed)
Sugars have been well controlled.  Will need follow up met b and a1c.   

## 2012-12-21 NOTE — Assessment & Plan Note (Signed)
Declines follow up bone density.  Continue calcium and vitamin D.   

## 2012-12-21 NOTE — Assessment & Plan Note (Signed)
Follow cbc.  

## 2012-12-21 NOTE — Assessment & Plan Note (Signed)
Continue follow up with Dr Meredeth Ide.  She feels she is not tolerating Performist.  Plans to discuss with Dr Meredeth Ide.  Has a f/u with him next week.  She plans to call him today.  Breathing stable.

## 2012-12-21 NOTE — Assessment & Plan Note (Signed)
Low cholesterol diet and exercise.  Follow lipid profile.    

## 2012-12-23 ENCOUNTER — Other Ambulatory Visit (INDEPENDENT_AMBULATORY_CARE_PROVIDER_SITE_OTHER): Payer: Medicare Other

## 2012-12-23 DIAGNOSIS — D649 Anemia, unspecified: Secondary | ICD-10-CM

## 2012-12-23 DIAGNOSIS — R5383 Other fatigue: Secondary | ICD-10-CM

## 2012-12-23 DIAGNOSIS — E119 Type 2 diabetes mellitus without complications: Secondary | ICD-10-CM

## 2012-12-23 DIAGNOSIS — E7800 Pure hypercholesterolemia, unspecified: Secondary | ICD-10-CM

## 2012-12-23 LAB — MICROALBUMIN / CREATININE URINE RATIO
Creatinine,U: 27 mg/dL
Microalb, Ur: 0.3 mg/dL (ref 0.0–1.9)

## 2012-12-23 LAB — COMPREHENSIVE METABOLIC PANEL
Albumin: 3.8 g/dL (ref 3.5–5.2)
Alkaline Phosphatase: 44 U/L (ref 39–117)
CO2: 28 mEq/L (ref 19–32)
Glucose, Bld: 99 mg/dL (ref 70–99)
Potassium: 4.6 mEq/L (ref 3.5–5.1)
Sodium: 141 mEq/L (ref 135–145)
Total Protein: 6.9 g/dL (ref 6.0–8.3)

## 2012-12-23 LAB — CBC WITH DIFFERENTIAL/PLATELET
Basophils Relative: 0.5 % (ref 0.0–3.0)
Eosinophils Relative: 2.3 % (ref 0.0–5.0)
Hemoglobin: 15.2 g/dL — ABNORMAL HIGH (ref 12.0–15.0)
Lymphocytes Relative: 18 % (ref 12.0–46.0)
Neutrophils Relative %: 71.5 % (ref 43.0–77.0)
RBC: 4.84 Mil/uL (ref 3.87–5.11)
WBC: 9.2 10*3/uL (ref 4.5–10.5)

## 2012-12-23 LAB — LIPID PANEL: VLDL: 27 mg/dL (ref 0.0–40.0)

## 2012-12-23 LAB — TSH: TSH: 1.87 u[IU]/mL (ref 0.35–5.50)

## 2012-12-23 LAB — LDL CHOLESTEROL, DIRECT: Direct LDL: 139.1 mg/dL

## 2012-12-24 ENCOUNTER — Encounter: Payer: Self-pay | Admitting: *Deleted

## 2013-02-11 ENCOUNTER — Inpatient Hospital Stay: Payer: Self-pay | Admitting: Internal Medicine

## 2013-02-11 LAB — COMPREHENSIVE METABOLIC PANEL
Albumin: 3.3 g/dL — ABNORMAL LOW (ref 3.4–5.0)
BUN: 8 mg/dL (ref 7–18)
Bilirubin,Total: 0.4 mg/dL (ref 0.2–1.0)
Calcium, Total: 8.5 mg/dL (ref 8.5–10.1)
Co2: 27 mmol/L (ref 21–32)
Creatinine: 0.81 mg/dL (ref 0.60–1.30)
EGFR (African American): >60
Glucose: 181 mg/dL — ABNORMAL HIGH (ref 65–99)
Osmolality: 280 (ref 275–301)
SGOT(AST): 10 U/L — ABNORMAL LOW (ref 15–37)
SGPT (ALT): 17 U/L (ref 12–78)
Sodium: 139 mmol/L (ref 136–145)
Total Protein: 6.2 g/dL — ABNORMAL LOW (ref 6.4–8.2)

## 2013-02-11 LAB — CBC
MCH: 31.2 pg (ref 26.0–34.0)
MCV: 90 fL (ref 80–100)
RBC: 4.76 10*6/uL (ref 3.80–5.20)

## 2013-02-11 LAB — PROTIME-INR: INR: 0.9

## 2013-02-11 LAB — PRO B NATRIURETIC PEPTIDE: B-Type Natriuretic Peptide: 243 pg/mL (ref 0–450)

## 2013-02-12 LAB — CBC WITH DIFFERENTIAL/PLATELET
Basophil #: 0 10*3/uL (ref 0.0–0.1)
Basophil %: 0.2 %
Eosinophil %: 0.1 %
HCT: 39.6 % (ref 35.0–47.0)
MCH: 31.3 pg (ref 26.0–34.0)
MCV: 88 fL (ref 80–100)
Monocyte %: 1 %
Neutrophil #: 11.9 10*3/uL — ABNORMAL HIGH (ref 1.4–6.5)
Neutrophil %: 95.3 %
RBC: 4.49 10*6/uL (ref 3.80–5.20)
RDW: 13.7 % (ref 11.5–14.5)
WBC: 12.5 10*3/uL — ABNORMAL HIGH (ref 3.6–11.0)

## 2013-02-12 LAB — BASIC METABOLIC PANEL
Anion Gap: 6 — ABNORMAL LOW (ref 7–16)
Chloride: 102 mmol/L (ref 98–107)
Co2: 28 mmol/L (ref 21–32)
Creatinine: 0.68 mg/dL (ref 0.60–1.30)
EGFR (Non-African Amer.): >60
Potassium: 4 mmol/L (ref 3.5–5.1)
Sodium: 136 mmol/L (ref 136–145)

## 2013-02-12 LAB — HEMOGLOBIN A1C: Hemoglobin A1C: 7.5 % — ABNORMAL HIGH (ref 4.2–6.3)

## 2013-02-12 LAB — TSH: Thyroid Stimulating Horm: 0.358 u[IU]/mL — ABNORMAL LOW

## 2013-02-12 LAB — MAGNESIUM: Magnesium: 2.1 mg/dL

## 2013-02-15 ENCOUNTER — Emergency Department: Payer: Self-pay | Admitting: Emergency Medicine

## 2013-02-15 LAB — URINALYSIS, COMPLETE
Bilirubin,UR: NEGATIVE
Glucose,UR: >=500 mg/dL (ref 0–75)
Leukocyte Esterase: NEGATIVE
Nitrite: NEGATIVE
Ph: 6 (ref 4.5–8.0)
RBC,UR: 1 /HPF (ref 0–5)
Squamous Epithelial: NONE SEEN

## 2013-02-15 LAB — TROPONIN I: Troponin-I: <0.02 ng/mL

## 2013-02-15 LAB — CBC
HCT: 43.2 % (ref 35.0–47.0)
HGB: 15.1 g/dL (ref 12.0–16.0)
MCH: 31.4 pg (ref 26.0–34.0)
RBC: 4.8 10*6/uL (ref 3.80–5.20)
RDW: 13.5 % (ref 11.5–14.5)
WBC: 12.3 10*3/uL — ABNORMAL HIGH (ref 3.6–11.0)

## 2013-02-15 LAB — BASIC METABOLIC PANEL
Anion Gap: 12 (ref 7–16)
BUN: 21 mg/dL — ABNORMAL HIGH (ref 7–18)
Calcium, Total: 8.8 mg/dL (ref 8.5–10.1)
Chloride: 104 mmol/L (ref 98–107)
Creatinine: 1.03 mg/dL (ref 0.60–1.30)
Glucose: 312 mg/dL — ABNORMAL HIGH (ref 65–99)
Potassium: 4.5 mmol/L (ref 3.5–5.1)
Sodium: 138 mmol/L (ref 136–145)

## 2013-02-15 LAB — CK TOTAL AND CKMB (NOT AT ARMC)
CK, Total: 61 U/L (ref 21–215)
CK-MB: 2.1 ng/mL (ref 0.5–3.6)

## 2013-02-18 LAB — CULTURE, BLOOD (SINGLE)

## 2013-02-25 ENCOUNTER — Telehealth: Payer: Self-pay | Admitting: *Deleted

## 2013-02-25 NOTE — Telephone Encounter (Signed)
Called to check on patient- She states that she is about the same. She saw Dr. Meredeth Ide & nothing was changed or added to her medications. Pt also metioned that she went back to the hospital the next day due to feet swelling. I have requested ER records & records from Dr. Mayo Ao

## 2013-02-25 NOTE — Telephone Encounter (Signed)
noted 

## 2013-03-08 ENCOUNTER — Telehealth: Payer: Self-pay | Admitting: Internal Medicine

## 2013-03-08 NOTE — Telephone Encounter (Signed)
Please advise 

## 2013-03-08 NOTE — Telephone Encounter (Signed)
Patient Information:  Caller Name: Dennie Bible  Phone: [phone]  Patient: Carla Harris, Carla Harris  Gender: Female  DOB: 15-Sep-1928  Age: 79 Years  PCP: Dale Lakeshire  Office Follow Up:  Does the office need to follow up with this patient?: Yes  Instructions For The Office: PLS SEE RN NOTE  RN Note:  Pt being treated for Pneumonia, discharge on 8-2, finished antibiotic 1 week. Pt has has Feet Edema for 3 weeks, Pt was seen at ED for Edema on 8-3 due to Edema was moving up Legs, Pt didn't receive any meds while in ED, Pt was advised to monitor and come back if swelling didn't improve after elevating. Pt continues to take Prednisone for breathing issues related to Pneumonia and history of COPD.  Pt has audible wheezing after inhaler and nebulizer.  Left Ankle is more swollen than the Right, unable to put shoes on.  Discussed ED dispo w/ Dr Roby Lofts nurse, RN will send note to MD to decide if Pt should be seen in ED or office.  Symptoms  Reason For Call & Symptoms: Feet Edema  Reviewed Health History In EMR: Yes  Reviewed Medications In EMR: Yes  Reviewed Allergies In EMR: Yes  Reviewed Surgeries / Procedures: Yes  Date of Onset of Symptoms: 02/14/2013  Treatments Tried: Elevated legs  Treatments Tried Worked: No  Guideline(s) Used:  Leg Swelling and Edema  Disposition Per Guideline:   Go to ED Now (or to Office with PCP Approval)  Reason For Disposition Reached:   Thigh, calf, or ankle swelling in both legs, but one side is definitely more swollen  Advice Given:  N/A  Patient Will Follow Care Advice:  YES

## 2013-03-08 NOTE — Telephone Encounter (Signed)
Pt notifed to go to ER or Acute Care-pt stated that she would go to Acute Care today

## 2013-03-08 NOTE — Telephone Encounter (Signed)
If unilateral swelling and more sob, would recommend ER eval (or at least acute care - where can be transferred to ER if needed).  Needs eval now.

## 2013-03-09 ENCOUNTER — Telehealth: Payer: Self-pay | Admitting: *Deleted

## 2013-03-10 ENCOUNTER — Encounter: Payer: Self-pay | Admitting: Internal Medicine

## 2013-03-10 ENCOUNTER — Ambulatory Visit (INDEPENDENT_AMBULATORY_CARE_PROVIDER_SITE_OTHER): Payer: Medicare Other | Admitting: Internal Medicine

## 2013-03-10 VITALS — BP 120/70 | HR 95 | Temp 98.2°F | Ht 62.0 in | Wt 117.5 lb

## 2013-03-10 DIAGNOSIS — R609 Edema, unspecified: Secondary | ICD-10-CM

## 2013-03-10 DIAGNOSIS — J479 Bronchiectasis, uncomplicated: Secondary | ICD-10-CM

## 2013-03-10 DIAGNOSIS — J449 Chronic obstructive pulmonary disease, unspecified: Secondary | ICD-10-CM

## 2013-03-10 DIAGNOSIS — D649 Anemia, unspecified: Secondary | ICD-10-CM

## 2013-03-10 DIAGNOSIS — E119 Type 2 diabetes mellitus without complications: Secondary | ICD-10-CM

## 2013-03-10 DIAGNOSIS — J4489 Other specified chronic obstructive pulmonary disease: Secondary | ICD-10-CM

## 2013-03-10 MED ORDER — CEFUROXIME AXETIL 250 MG PO TABS: 250.0000 mg | ORAL_TABLET | Freq: Two times a day (BID) | ORAL | Status: DC

## 2013-03-10 MED ORDER — DOXYCYCLINE HYCLATE 100 MG PO TABS: 100.0000 mg | ORAL_TABLET | Freq: Two times a day (BID) | ORAL | Status: DC

## 2013-03-10 MED ORDER — PREDNISONE 10 MG PO TABS: ORAL_TABLET | ORAL | Status: DC

## 2013-03-14 ENCOUNTER — Encounter: Payer: Self-pay | Admitting: Internal Medicine

## 2013-03-14 DIAGNOSIS — R609 Edema, unspecified: Secondary | ICD-10-CM | POA: Insufficient documentation

## 2013-03-14 NOTE — Assessment & Plan Note (Signed)
Sugars have been well controlled.  Will need follow up met b and a1c.  Sugar elevated in ER.  Follow.

## 2013-03-14 NOTE — Assessment & Plan Note (Signed)
Is followed by Dr Meredeth Ide.  Treat current flare as outlined.  Follow.

## 2013-03-14 NOTE — Assessment & Plan Note (Signed)
Ankle swelling.  Treat current infection.  Elevate legs.  Support hose.  Get her back in soon to reassess.   Follow closely.  No swelling extending up the leg.  No increased erythema.

## 2013-03-14 NOTE — Progress Notes (Signed)
Subjective:    Patient ID: Carla Harris, female    DOB: 07-17-1928, 79 y.o.   MRN: [id number]  HPI 79 year old female with past history of COPD/bronchiectasis, anemia and hypercholesterolemia who comes in today for a ER/hospital follow up.  Was hospitalized 02/10/13-02/13/13 with pneumonia.  Was given abx and steroids.  Uses her nebulizer and her inhalers.  She then noticed some increased andle swelling and black around her ankles.  Went to the ER.  EKG and w/up unrevealing.  She states that her ankle is some better, but with some increased swelling.  The black color has resolved.  No known injury or trauma.  She reports she was feeling better with the abx and steroid taper,but now the cough and congestion are returning.  Some wheezing and sob.  Not as severs as when she went to the hospital, but starting to worsen.  She is eating and drinking well.     Past Medical History  Diagnosis Date  . Anemia   . Diabetes mellitus without complication   . Bronchiectasis   . Colon polyps   . Hypercholesteremia   . S/P AAA repair   . Right rotator cuff tear   . COPD (chronic obstructive pulmonary disease)   . Low serum IgG1 and IgM levels     Current Outpatient Prescriptions on File Prior to Visit  Medication Sig Dispense Refill  . acetaminophen (TYLENOL) 500 MG tablet Take 500 mg by mouth every 6 (six) hours as needed.      Marland Kitchen albuterol-ipratropium (COMBIVENT) 18-103 MCG/ACT inhaler Inhale 2 puffs into the lungs every 6 (six) hours as needed.      . ALPRAZolam (XANAX) 0.25 MG tablet Take 1 tablet (0.25 mg total) by mouth 2 (two) times daily as needed.  40 tablet  0  . aspirin EC 81 MG tablet Take 81 mg by mouth daily.      . cetirizine (ZYRTEC) 10 MG tablet Take 10 mg by mouth daily.      . Cholecalciferol (VITAMIN D3) 2000 UNITS capsule Take 2,000 Units by mouth daily.      Marland Kitchen esomeprazole (NEXIUM) 40 MG capsule Take 40 mg by mouth daily before breakfast. Take 1 capsule twice a day      .  Fluticasone-Salmeterol (ADVAIR) 250-50 MCG/DOSE AEPB Inhale 1 puff into the lungs every 12 (twelve) hours.      . formoterol (PERFOROMIST) 20 MCG/2ML nebulizer solution Take 20 mcg by nebulization 2 (two) times daily.      Marland Kitchen ipratropium-albuterol (DUONEB) 0.5-2.5 (3) MG/3ML SOLN Take 3 mLs by nebulization every 6 (six) hours as needed.      . metFORMIN (GLUCOPHAGE) 500 MG tablet Take 500 mg by mouth 2 (two) times daily with a meal. Two times a day with meal.      . montelukast (SINGULAIR) 10 MG tablet Take 10 mg by mouth at bedtime.      . pravastatin (PRAVACHOL) 20 MG tablet Take 20 mg by mouth daily.      . predniSONE (DELTASONE) 20 MG tablet Take 20 mg by mouth daily. Take 3 tablets daily      . theophylline (THEODUR) 200 MG 12 hr tablet Take 200 mg by mouth 2 (two) times daily.       No current facility-administered medications on file prior to visit.    Review of Systems Patient denies any headache, lightheadedness or dizziness.  No significant sinus or allergy symptoms.  No chest pain.  She does report the increased  chest congestion, cough and sob.   Increased wheezing.  No nausea or vomiting.  No abdominal pain or cramping.  No bowel change, such as diarrhea, constipation, BRBPR or melana.  No urine change.  Lower extremity swelling as outlined.       Objective:   Physical Exam  Filed Vitals:   03/10/13 1134  BP: 120/70  Pulse: 95  Temp: 98.2 F (98.49 C)   79 year old female in no acute distress.    HEENT:  Nares - clear.  OP- without lesions or erythema.  NECK:  Supple, nontender.     HEART:  Appears to be regular. LUNGS:   Increased congestion.  Increased cough with expiration and especially with forced expiration.  Clears some with coughing.  Some wheezing, but increased air movwmwnr.    RADIAL PULSE:  Equal bilaterally.  ABDOMEN:  Soft, nontender.     EXTREMITIES:  Some ankle edema present.  No increased redness or discoloration.                      Assessment & Plan:   SMOKING CESSATION.  Have discussed with her today the need to quit.  She desires not to quit at this time.    PREVIOUS WEIGHT LOSS.  Weight trending down.   Treat the current flare.  Follow.  Has declined further w/up.   CARDIOVASCULAR.  Stable.    INCREASED PSYCHOSOCIAL STRESSORS.  Feels she is handling things relatively well.  Follow.    HISTORY OF AAA REPAIR.  Last ultrasound 2010.  Dr Maye Hides reviewed.  He felt no further w/up for several years - then follow up with ultrasound.  (phone Dr Maye Hides - [phone]).    HEALTH MAINTENANCE.  Physical 12/05/11.  She declined GU and rectal exam.  Declined GI evaluation.  Will notify me when agreeable for bone density.  Mammogram 04/17/11 - BiRads II.  Will see if agreeable for f/u mammogram.

## 2013-03-14 NOTE — Assessment & Plan Note (Signed)
Follow cbc.  

## 2013-03-14 NOTE — Assessment & Plan Note (Signed)
Continue follow up with Dr Meredeth Ide.  Just recently admitted and treated for pneumonia.  CXR with a small left pleural effusion.  Now with increased cough and congestion.  Will treat with doxycycline and ceftin.  Prednisone taper as directed.  Will get her back in soon to reassess.  Nebs and inhalers as directed.  Follow.

## 2013-03-17 NOTE — Telephone Encounter (Signed)
Opened in error

## 2013-03-26 ENCOUNTER — Ambulatory Visit (INDEPENDENT_AMBULATORY_CARE_PROVIDER_SITE_OTHER): Payer: Medicare Other | Admitting: Internal Medicine

## 2013-03-26 ENCOUNTER — Encounter: Payer: Self-pay | Admitting: Internal Medicine

## 2013-03-26 VITALS — BP 130/70 | HR 150 | Temp 98.0°F | Ht 62.0 in | Wt 117.2 lb

## 2013-03-26 DIAGNOSIS — E119 Type 2 diabetes mellitus without complications: Secondary | ICD-10-CM

## 2013-03-26 DIAGNOSIS — J449 Chronic obstructive pulmonary disease, unspecified: Secondary | ICD-10-CM

## 2013-03-26 DIAGNOSIS — D649 Anemia, unspecified: Secondary | ICD-10-CM

## 2013-03-26 DIAGNOSIS — J479 Bronchiectasis, uncomplicated: Secondary | ICD-10-CM

## 2013-03-26 DIAGNOSIS — R609 Edema, unspecified: Secondary | ICD-10-CM

## 2013-03-26 DIAGNOSIS — E7800 Pure hypercholesterolemia, unspecified: Secondary | ICD-10-CM

## 2013-03-26 MED ORDER — METFORMIN HCL 500 MG PO TABS: 500.0000 mg | ORAL_TABLET | Freq: Two times a day (BID) | ORAL | Status: DC

## 2013-03-26 MED ORDER — CLOTRIMAZOLE-BETAMETHASONE 1-0.05 % EX CREA: TOPICAL_CREAM | Freq: Two times a day (BID) | CUTANEOUS | Status: DC

## 2013-03-28 ENCOUNTER — Encounter: Payer: Self-pay | Admitting: Internal Medicine

## 2013-03-28 NOTE — Assessment & Plan Note (Signed)
Follow cbc.  

## 2013-03-28 NOTE — Assessment & Plan Note (Signed)
Continue follow up with Dr Meredeth Ide.  Just recently admitted and treated for pneumonia.  CXR with a small left pleural effusion.  Will need follow xray.  Has f/u scheduled with Dr Meredeth Ide.  Breathing back to her baseline.

## 2013-03-28 NOTE — Assessment & Plan Note (Signed)
Improved

## 2013-03-28 NOTE — Assessment & Plan Note (Signed)
Sugars have been well controlled.  Will need follow up met b and a1c.   

## 2013-03-28 NOTE — Assessment & Plan Note (Signed)
Is followed by Dr Meredeth Ide.

## 2013-03-28 NOTE — Progress Notes (Signed)
Subjective:    Patient ID: Carla Harris, female    DOB: 1929-04-12, 79 y.o.   MRN: [id number]  HPI 79 year old female with past history of COPD/bronchiectasis, anemia and hypercholesterolemia who comes in today for a scheduled follow up.   Was hospitalized 02/10/13-02/13/13 with pneumonia.  Was given abx and steroids.  Uses her nebulizer and her inhalers.  She then noticed some increased andle swelling and black around her ankles.  Went to the ER.  EKG and w/up unrevealing.  No known injury or trauma.  I saw her in 8/14 for hospital follow up.  Extended out her abx.  She comes in today stating that her breathing is at her baseline.  Still has increased cough and congestion - worse in the am.  Stable.   She is eating and drinking well.  Uses her oxygen.  Denies any increased heart rate or palpitations.  No chest pain or tightness.  Bowels stable.     Past Medical History  Diagnosis Date  . Anemia   . Diabetes mellitus without complication   . Bronchiectasis   . Colon polyps   . Hypercholesteremia   . S/P AAA repair   . Right rotator cuff tear   . COPD (chronic obstructive pulmonary disease)   . Low serum IgG1 and IgM levels     Current Outpatient Prescriptions on File Prior to Visit  Medication Sig Dispense Refill  . acetaminophen (TYLENOL) 500 MG tablet Take 500 mg by mouth every 6 (six) hours as needed.      Marland Kitchen albuterol-ipratropium (COMBIVENT) 18-103 MCG/ACT inhaler Inhale 2 puffs into the lungs every 6 (six) hours as needed.      . ALPRAZolam (XANAX) 0.25 MG tablet Take 1 tablet (0.25 mg total) by mouth 2 (two) times daily as needed.  40 tablet  0  . aspirin EC 81 MG tablet Take 81 mg by mouth daily.      . cetirizine (ZYRTEC) 10 MG tablet Take 10 mg by mouth daily.      . Cholecalciferol (VITAMIN D3) 2000 UNITS capsule Take 2,000 Units by mouth daily.      Marland Kitchen esomeprazole (NEXIUM) 40 MG capsule Take 40 mg by mouth daily before breakfast. Take 1 capsule twice a day      .  Fluticasone-Salmeterol (ADVAIR) 250-50 MCG/DOSE AEPB Inhale 1 puff into the lungs every 12 (twelve) hours.      . formoterol (PERFOROMIST) 20 MCG/2ML nebulizer solution Take 20 mcg by nebulization 2 (two) times daily.      Marland Kitchen ipratropium-albuterol (DUONEB) 0.5-2.5 (3) MG/3ML SOLN Take 3 mLs by nebulization every 6 (six) hours as needed.      . montelukast (SINGULAIR) 10 MG tablet Take 10 mg by mouth at bedtime.      . pravastatin (PRAVACHOL) 20 MG tablet Take 20 mg by mouth daily.      . predniSONE (DELTASONE) 20 MG tablet Take 20 mg by mouth daily. Take 3 tablets daily      . theophylline (THEODUR) 200 MG 12 hr tablet Take 200 mg by mouth 2 (two) times daily.       No current facility-administered medications on file prior to visit.    Review of Systems Patient denies any headache, lightheadedness or dizziness.  No significant sinus or allergy symptoms.  No chest pain.  She does report the increased chest congestion, cough as outlined.  Worse in the am.   Breathing at her baseline.  No nausea or vomiting.  No abdominal  pain or cramping.  No bowel change, such as diarrhea, constipation, BRBPR or melana.  No urine change.  Lower extremity swelling improved.  No increased heart rate or palpitations.       Objective:   Physical Exam  Filed Vitals:   03/26/13 1105  BP: 130/70  Pulse: 150  Temp: 98 F (36.7 C)   Pulse recheck 96-100.  Walked her - pulse 12-55.   79 year old female in no acute distress.    HEENT:  Nares - clear.  OP- without lesions or erythema.  NECK:  Supple, nontender.     HEART:  Appears to be regular. LUNGS:   Increased air movement.  Some congestion which clears some with coughing.  Some expiratory wheezing.     RADIAL PULSE:  Equal bilaterally.  ABDOMEN:  Soft, nontender.     EXTREMITIES:  Swelling improved.   No increased redness or discoloration.                      Assessment & Plan:  SMOKING CESSATION.  Have discussed with her today the need to quit.  She  desires not to quit at this time.     CARDIOVASCULAR.  Stable.    INCREASED PSYCHOSOCIAL STRESSORS.  Feels she is handling things relatively well.  Follow.    HISTORY OF AAA REPAIR.  Last ultrasound 2010.  Dr Maye Hides reviewed.  He felt no further w/up for several years - then follow up with ultrasound.  (phone Dr Maye Hides - [phone]).    HEALTH MAINTENANCE.  Physical 12/05/11.  She declined GU and rectal exam.  Declined GI evaluation.  Will notify me when agreeable for bone density.  Mammogram 04/17/11 - BiRads II.  Will see if agreeable for f/u mammogram.

## 2013-03-28 NOTE — Assessment & Plan Note (Signed)
Low cholesterol diet and exercise.  Follow lipid profile.    

## 2013-04-16 ENCOUNTER — Encounter: Payer: Medicare Other | Admitting: Internal Medicine

## 2013-06-01 ENCOUNTER — Encounter: Payer: Self-pay | Admitting: Internal Medicine

## 2013-06-01 ENCOUNTER — Ambulatory Visit (INDEPENDENT_AMBULATORY_CARE_PROVIDER_SITE_OTHER): Payer: Medicare Other | Admitting: Internal Medicine

## 2013-06-01 VITALS — BP 122/70 | HR 108 | Temp 98.2°F | Ht 62.0 in | Wt 116.0 lb

## 2013-06-01 DIAGNOSIS — D649 Anemia, unspecified: Secondary | ICD-10-CM

## 2013-06-01 DIAGNOSIS — E7800 Pure hypercholesterolemia, unspecified: Secondary | ICD-10-CM

## 2013-06-01 DIAGNOSIS — R609 Edema, unspecified: Secondary | ICD-10-CM

## 2013-06-01 DIAGNOSIS — Z86010 Personal history of colonic polyps: Secondary | ICD-10-CM

## 2013-06-01 DIAGNOSIS — E119 Type 2 diabetes mellitus without complications: Secondary | ICD-10-CM

## 2013-06-01 DIAGNOSIS — J479 Bronchiectasis, uncomplicated: Secondary | ICD-10-CM

## 2013-06-01 DIAGNOSIS — J449 Chronic obstructive pulmonary disease, unspecified: Secondary | ICD-10-CM

## 2013-06-01 MED ORDER — AZITHROMYCIN 250 MG PO TABS: ORAL_TABLET | ORAL | Status: DC

## 2013-06-01 MED ORDER — PREDNISONE 10 MG PO TABS: ORAL_TABLET | ORAL | Status: DC

## 2013-06-01 NOTE — Progress Notes (Signed)
Pre-visit discussion using our clinic review tool. No additional management support is needed unless otherwise documented below in the visit note.  

## 2013-06-06 ENCOUNTER — Encounter: Payer: Self-pay | Admitting: Internal Medicine

## 2013-06-06 NOTE — Assessment & Plan Note (Signed)
Resolved

## 2013-06-06 NOTE — Progress Notes (Signed)
Subjective:    Patient ID: Carla Harris, female    DOB: 1929/05/27, 79 y.o.   MRN: [id number]  HPI 79 year old female with past history of COPD/bronchiectasis, anemia and hypercholesterolemia who comes in today for a scheduled follow up.   Was hospitalized 02/10/13-02/13/13 with pneumonia.  Was given abx and steroids.  Uses her nebulizer and her inhalers.  She then noticed some increased ankle swelling and black around her ankles.  Went to the ER.  EKG and w/up unrevealing.  No known injury or trauma.  I saw her in 8/14 for hospital follow up.  Extended out her abx.  She comes in today for a scheduled follow up.  Reports increased cough and congestion - worse in the am.  States she recently saw Dr Meredeth Ide.  He did cxr and labs.  cxr revealed COPD and a small left pleural effusion with trace right pleural effusion.  No further w/up or treatment - per pt.  She has a f/u appt on 07/01/13.  She is using her nebulizer 2-3x/day and using her advair regularly.  She is eating and drinking well.  Uses her oxygen.  Denies any increased heart rate or palpitations.  No chest pain.  Bowels stable.  Still smoking.  Have again discussed the need to quit.     Past Medical History  Diagnosis Date  . Anemia   . Diabetes mellitus without complication   . Bronchiectasis   . Colon polyps   . Hypercholesteremia   . S/P AAA repair   . Right rotator cuff tear   . COPD (chronic obstructive pulmonary disease)   . Low serum IgG1 and IgM levels     Current Outpatient Prescriptions on File Prior to Visit  Medication Sig Dispense Refill  . albuterol-ipratropium (COMBIVENT) 18-103 MCG/ACT inhaler Inhale 2 puffs into the lungs every 6 (six) hours as needed.      Marland Kitchen aspirin EC 81 MG tablet Take 81 mg by mouth daily.      . cetirizine (ZYRTEC) 10 MG tablet Take 10 mg by mouth daily.      . Cholecalciferol (VITAMIN D3) 2000 UNITS capsule Take 2,000 Units by mouth daily.      Marland Kitchen esomeprazole (NEXIUM) 40 MG capsule Take 40 mg by  mouth daily before breakfast. Take 1 capsule twice a day      . Fluticasone-Salmeterol (ADVAIR) 250-50 MCG/DOSE AEPB Inhale 1 puff into the lungs every 12 (twelve) hours.      . formoterol (PERFOROMIST) 20 MCG/2ML nebulizer solution Take 20 mcg by nebulization 2 (two) times daily.      Marland Kitchen ipratropium-albuterol (DUONEB) 0.5-2.5 (3) MG/3ML SOLN Take 3 mLs by nebulization every 6 (six) hours as needed.      . metFORMIN (GLUCOPHAGE) 500 MG tablet Take 1 tablet (500 mg total) by mouth 2 (two) times daily with a meal. Two times a day with meal.  180 tablet  3  . montelukast (SINGULAIR) 10 MG tablet Take 10 mg by mouth at bedtime.      . theophylline (THEODUR) 200 MG 12 hr tablet Take 200 mg by mouth 2 (two) times daily.      Marland Kitchen acetaminophen (TYLENOL) 500 MG tablet Take 500 mg by mouth every 6 (six) hours as needed.      . pravastatin (PRAVACHOL) 20 MG tablet Take 20 mg by mouth daily.       No current facility-administered medications on file prior to visit.    Review of Systems Patient denies any  headache, lightheadedness or dizziness.  No significant sinus or allergy symptoms.  No chest pain.  She does report the increased chest congestion, cough as outlined.  Worse in the am.  Some sob with exertion.  No acute sob.   No nausea or vomiting.  No abdominal pain or cramping.  No bowel change, such as diarrhea, constipation, BRBPR or melana.  No urine change.  Lower extremity swelling improved.  No increased heart rate or palpitations.       Objective:   Physical Exam  Filed Vitals:   06/01/13 1058  BP: 122/70  Pulse: 108  Temp: 98.2 F (36.8 C)   Pulse recheck 14-65.    79 year old female in no acute distress.    HEENT:  Nares - clear.  OP- without lesions or erythema.  NECK:  Supple, nontender.     HEART:  Appears to be regular. LUNGS:   Increased air movement.  Some congestion which clears some with coughing.  Some minimal expiratory wheezing.     RADIAL PULSE:  Equal bilaterally.   ABDOMEN:  Soft, nontender.     EXTREMITIES:  Swelling improved - resolved.   No increased redness or discoloration.                      Assessment & Plan:  SMOKING CESSATION.  Have discussed with her today the need to quit.  She desires not to quit at this time.     CARDIOVASCULAR.  Stable.    INCREASED PSYCHOSOCIAL STRESSORS.  Feels she is handling things relatively well.  Follow.    HISTORY OF AAA REPAIR.  Last ultrasound 2010.  Dr Maye Hides reviewed.  He felt no further w/up for several years - then follow up with ultrasound.  (phone Dr Maye Hides - [phone]).    HEALTH MAINTENANCE.  Physical 12/05/11.  She declined GU and rectal exam.  Declined GI evaluation.  Will notify me when agreeable for bone density.  Mammogram 04/17/11 - BiRads II.  Will notify me when agreeable for f/u mammogram.

## 2013-06-06 NOTE — Assessment & Plan Note (Signed)
Continue follow up with Dr Meredeth Ide.  CXR with a small left pleural effusion.  Will need follow xray.  Has f/u scheduled with Dr Meredeth Ide.  See w/up and treatment plan as outlined.  Treat current flare.

## 2013-06-06 NOTE — Assessment & Plan Note (Signed)
Sugars have been well controlled.  Will need follow up met b and a1c.   

## 2013-06-06 NOTE — Assessment & Plan Note (Signed)
Declines GI evaluation.   

## 2013-06-06 NOTE — Assessment & Plan Note (Signed)
Low cholesterol diet and exercise.  Follow lipid profile.    

## 2013-06-06 NOTE — Assessment & Plan Note (Signed)
Follow cbc.  

## 2013-06-06 NOTE — Assessment & Plan Note (Signed)
Is followed by Dr Meredeth Ide.   Just recently evaluated.  CXR as outlined.  Has f/u with Dr Meredeth Ide 07/01/13.  Discussed with her regarding the need for further f/u and work up regarding the pleural effusion.  Discussed f/u cxr, CT scanning and cardiac w/up (including ECHO).  Treat current flare.  Will give her a prednisone taper starting at 60mg  and decreasing by 5mg  until off.  zpak as directed.  (allergic to other abx).  Use her nebulizer and inhalers as directed.  Continue O2.  Get her back in soon to reassess.  She wants to hold on further scanning or w/up right now.  Wants to treat the current infection and then recheck cxr.  Further w/up pending results of f/u cxr.

## 2013-07-13 ENCOUNTER — Telehealth: Payer: Self-pay | Admitting: Internal Medicine

## 2013-07-13 ENCOUNTER — Ambulatory Visit: Payer: Medicare Other | Admitting: Internal Medicine

## 2013-07-13 ENCOUNTER — Emergency Department: Payer: Self-pay | Admitting: Emergency Medicine

## 2013-07-13 LAB — CBC
HCT: 44.7 % (ref 35.0–47.0)
HGB: 15.1 g/dL (ref 12.0–16.0)
MCHC: 33.6 g/dL (ref 32.0–36.0)
MCV: 90 fL (ref 80–100)
Platelet: 199 10*3/uL (ref 150–440)
RDW: 13.7 % (ref 11.5–14.5)
WBC: 9.8 10*3/uL (ref 3.6–11.0)

## 2013-07-13 LAB — BASIC METABOLIC PANEL
Calcium, Total: 9.3 mg/dL (ref 8.5–10.1)
Co2: 26 mmol/L (ref 21–32)
Glucose: 142 mg/dL — ABNORMAL HIGH (ref 65–99)
Osmolality: 280 (ref 275–301)
Potassium: 3.9 mmol/L (ref 3.5–5.1)

## 2013-07-13 LAB — TROPONIN I: Troponin-I: <0.02 ng/mL

## 2013-07-13 NOTE — Telephone Encounter (Signed)
Daughter called to let you know that Carla Harris with to the er this morning by ambulance this morning for sob

## 2013-07-13 NOTE — Telephone Encounter (Signed)
FYI-pt was scheduled for a follow-up appt this morning (appt was cancelled)-see below

## 2013-07-13 NOTE — Telephone Encounter (Signed)
Noted. Keep us posted

## 2013-07-14 ENCOUNTER — Inpatient Hospital Stay: Payer: Self-pay | Admitting: Family Medicine

## 2013-07-14 LAB — CBC
HCT: 42.7 % (ref 35.0–47.0)
HGB: 14.3 g/dL (ref 12.0–16.0)
MCH: 30.3 pg (ref 26.0–34.0)
RBC: 4.73 10*6/uL (ref 3.80–5.20)
RDW: 13.9 % (ref 11.5–14.5)
WBC: 14.9 10*3/uL — ABNORMAL HIGH (ref 3.6–11.0)

## 2013-07-14 LAB — BASIC METABOLIC PANEL
Anion Gap: 12 (ref 7–16)
BUN: 10 mg/dL (ref 7–18)
Calcium, Total: 9 mg/dL (ref 8.5–10.1)
Chloride: 105 mmol/L (ref 98–107)
Creatinine: 0.98 mg/dL (ref 0.60–1.30)
EGFR (African American): >60
EGFR (Non-African Amer.): 54 — ABNORMAL LOW
Glucose: 243 mg/dL — ABNORMAL HIGH (ref 65–99)
Osmolality: 283 (ref 275–301)
Potassium: 4.2 mmol/L (ref 3.5–5.1)
Sodium: 138 mmol/L (ref 136–145)

## 2013-07-14 LAB — RAPID INFLUENZA A&B ANTIGENS

## 2013-07-14 LAB — TROPONIN I: Troponin-I: <0.02 ng/mL

## 2013-07-15 LAB — MAGNESIUM: Magnesium: 2 mg/dL

## 2013-07-15 LAB — BASIC METABOLIC PANEL
Anion Gap: 9 (ref 7–16)
BUN: 16 mg/dL (ref 7–18)
Calcium, Total: 8.7 mg/dL (ref 8.5–10.1)
Chloride: 107 mmol/L (ref 98–107)
Co2: 25 mmol/L (ref 21–32)
Creatinine: 0.88 mg/dL (ref 0.60–1.30)
EGFR (African American): >60
EGFR (Non-African Amer.): >60
Glucose: 167 mg/dL — ABNORMAL HIGH (ref 65–99)
Osmolality: 286 (ref 275–301)
Potassium: 4.1 mmol/L (ref 3.5–5.1)
Sodium: 141 mmol/L (ref 136–145)

## 2013-07-15 LAB — CBC WITH DIFFERENTIAL/PLATELET
Basophil #: 0 10*3/uL (ref 0.0–0.1)
Basophil %: 0.1 %
Eosinophil #: 0 10*3/uL (ref 0.0–0.7)
Eosinophil %: 0 %
HCT: 38.6 % (ref 35.0–47.0)
HGB: 13.4 g/dL (ref 12.0–16.0)
Lymphocyte #: 0.3 10*3/uL — ABNORMAL LOW (ref 1.0–3.6)
Lymphocyte %: 3.1 %
MCH: 31 pg (ref 26.0–34.0)
MCHC: 34.7 g/dL (ref 32.0–36.0)
MCV: 89 fL (ref 80–100)
Monocyte #: 0.2 10*3/uL (ref 0.2–0.9)
Monocyte %: 2.1 %
Neutrophil #: 9.4 10*3/uL — ABNORMAL HIGH (ref 1.4–6.5)
Neutrophil %: 94.7 %
Platelet: 188 10*3/uL (ref 150–440)
RBC: 4.31 10*6/uL (ref 3.80–5.20)
RDW: 13.6 % (ref 11.5–14.5)
WBC: 9.9 10*3/uL (ref 3.6–11.0)

## 2013-07-17 DIAGNOSIS — R079 Chest pain, unspecified: Secondary | ICD-10-CM

## 2013-07-17 LAB — MAGNESIUM: Magnesium: 2 mg/dL

## 2013-07-17 LAB — CK-MB
CK-MB: 0.8 ng/mL (ref 0.5–3.6)
CK-MB: 1 ng/mL (ref 0.5–3.6)
CK-MB: 1.1 ng/mL (ref 0.5–3.6)

## 2013-07-17 LAB — TROPONIN I
Troponin-I: <0.02 ng/mL
Troponin-I: <0.02 ng/mL

## 2013-07-18 LAB — CBC WITH DIFFERENTIAL/PLATELET
BASOS ABS: 0 10*3/uL (ref 0.0–0.1)
BASOS PCT: 0.1 %
Eosinophil #: 0 10*3/uL (ref 0.0–0.7)
Eosinophil %: 0 %
HCT: 39.6 % (ref 35.0–47.0)
HGB: 13.5 g/dL (ref 12.0–16.0)
LYMPHS ABS: 0.3 10*3/uL — AB (ref 1.0–3.6)
Lymphocyte %: 2.1 %
MCH: 30.7 pg (ref 26.0–34.0)
MCHC: 34.1 g/dL (ref 32.0–36.0)
MCV: 90 fL (ref 80–100)
MONO ABS: 0.3 x10 3/mm (ref 0.2–0.9)
Monocyte %: 2.1 %
Neutrophil #: 12.9 10*3/uL — ABNORMAL HIGH (ref 1.4–6.5)
Neutrophil %: 95.7 %
Platelet: 169 10*3/uL (ref 150–440)
RBC: 4.41 10*6/uL (ref 3.80–5.20)
RDW: 13.5 % (ref 11.5–14.5)
WBC: 13.5 10*3/uL — AB (ref 3.6–11.0)

## 2013-07-18 LAB — BASIC METABOLIC PANEL
Anion Gap: 9 (ref 7–16)
BUN: 24 mg/dL — ABNORMAL HIGH (ref 7–18)
CHLORIDE: 101 mmol/L (ref 98–107)
Calcium, Total: 9 mg/dL (ref 8.5–10.1)
Co2: 25 mmol/L (ref 21–32)
Creatinine: 0.82 mg/dL (ref 0.60–1.30)
GLUCOSE: 229 mg/dL — AB (ref 65–99)
Osmolality: 281 (ref 275–301)
POTASSIUM: 4.6 mmol/L (ref 3.5–5.1)
Sodium: 135 mmol/L — ABNORMAL LOW (ref 136–145)

## 2013-07-19 ENCOUNTER — Telehealth: Payer: Self-pay | Admitting: Internal Medicine

## 2013-07-19 NOTE — Telephone Encounter (Signed)
Pt needs 1-2 wk hospital follow up.  Discharge date 07/19/2013.  No available 30 min slot until February.  Please advise.

## 2013-07-19 NOTE — Telephone Encounter (Signed)
She can come in on 07/27/13 at 9:00 - for hospital follow up.  Please request hospital records.

## 2013-07-27 ENCOUNTER — Encounter: Payer: Self-pay | Admitting: Internal Medicine

## 2013-07-27 ENCOUNTER — Telehealth: Payer: Self-pay | Admitting: Internal Medicine

## 2013-07-27 ENCOUNTER — Ambulatory Visit (INDEPENDENT_AMBULATORY_CARE_PROVIDER_SITE_OTHER): Payer: Medicare Other | Admitting: Internal Medicine

## 2013-07-27 VITALS — BP 120/68 | HR 87 | Temp 98.0°F | Ht 62.0 in | Wt 110.8 lb

## 2013-07-27 DIAGNOSIS — M810 Age-related osteoporosis without current pathological fracture: Secondary | ICD-10-CM

## 2013-07-27 DIAGNOSIS — Z86010 Personal history of colonic polyps: Secondary | ICD-10-CM

## 2013-07-27 DIAGNOSIS — J449 Chronic obstructive pulmonary disease, unspecified: Secondary | ICD-10-CM

## 2013-07-27 DIAGNOSIS — E7800 Pure hypercholesterolemia, unspecified: Secondary | ICD-10-CM

## 2013-07-27 DIAGNOSIS — R609 Edema, unspecified: Secondary | ICD-10-CM

## 2013-07-27 DIAGNOSIS — D649 Anemia, unspecified: Secondary | ICD-10-CM

## 2013-07-27 DIAGNOSIS — E119 Type 2 diabetes mellitus without complications: Secondary | ICD-10-CM

## 2013-07-27 DIAGNOSIS — J479 Bronchiectasis, uncomplicated: Secondary | ICD-10-CM

## 2013-07-27 LAB — HM DIABETES FOOT EXAM

## 2013-07-27 NOTE — Telephone Encounter (Signed)
See below

## 2013-07-27 NOTE — Telephone Encounter (Signed)
Pt states she was to let Dr. Lorin PicketScott know when she has an appt with Dr. Meredeth IdeFleming:  2/25 @ 10:00 a.m.

## 2013-07-27 NOTE — Progress Notes (Signed)
Pre-visit discussion using our clinic review tool. No additional management support is needed unless otherwise documented below in the visit note.  

## 2013-07-27 NOTE — Telephone Encounter (Signed)
Noted  

## 2013-07-31 ENCOUNTER — Encounter: Payer: Self-pay | Admitting: Internal Medicine

## 2013-07-31 NOTE — Assessment & Plan Note (Signed)
Resolved

## 2013-07-31 NOTE — Assessment & Plan Note (Signed)
Sugars have been well controlled.  Will need follow up met b and a1c.

## 2013-07-31 NOTE — Assessment & Plan Note (Signed)
Follow cbc.  

## 2013-07-31 NOTE — Assessment & Plan Note (Signed)
Declines GI evaluation.

## 2013-07-31 NOTE — Assessment & Plan Note (Signed)
Continue follow up with Dr Meredeth IdeFleming.  CXR with trace bilateral pleural effusion.  Will need follow xray.  Has f/u scheduled with Dr Meredeth IdeFleming.  Currently stable.  Continue nebs and inhalers.

## 2013-07-31 NOTE — Progress Notes (Signed)
Subjective:    Patient ID: Carla Harris, female    DOB: 05/26/1929, 80 y.o.   MRN: [id number]  HPI 80 year old female with past history of COPD/bronchiectasis, anemia and hypercholesterolemia who comes in today for a scheduled follow up.   Was hospitalized 07/14/13-07/19/13 for COPD exacerbation/SIRS.   Was given abx and steroids.  Uses her nebulizer and her inhalers.  CXR revealed COPD and trace bilateral pleural effusions.  She is following up with Dr Meredeth IdeFleming for the cxr.   She is eating and drinking well.  Uses her oxygen.  Denies any increased heart rate or palpitations.  No chest pain.  Bowels stable.  Still smoking.  Have again discussed the need to quit.  Breathing currently stable.     Past Medical History  Diagnosis Date  . Anemia   . Diabetes mellitus without complication   . Bronchiectasis   . Colon polyps   . Hypercholesteremia   . S/P AAA repair   . Right rotator cuff tear   . COPD (chronic obstructive pulmonary disease)   . Low serum IgG1 and IgM levels     Current Outpatient Prescriptions on File Prior to Visit  Medication Sig Dispense Refill  . acetaminophen (TYLENOL) 500 MG tablet Take 500 mg by mouth every 6 (six) hours as needed.      Marland Kitchen. albuterol-ipratropium (COMBIVENT) 18-103 MCG/ACT inhaler Inhale 2 puffs into the lungs every 6 (six) hours as needed.      . ALPRAZolam (XANAX) 0.25 MG tablet Take 0.25 mg by mouth every 8 (eight) hours as needed.      Marland Kitchen. aspirin EC 81 MG tablet Take 81 mg by mouth daily.      . cetirizine (ZYRTEC) 10 MG tablet Take 10 mg by mouth daily.      . Cholecalciferol (VITAMIN D3) 2000 UNITS capsule Take 2,000 Units by mouth daily.      Marland Kitchen. esomeprazole (NEXIUM) 40 MG capsule Take 40 mg by mouth daily before breakfast. Take 1 capsule twice a day      . Fluticasone-Salmeterol (ADVAIR) 250-50 MCG/DOSE AEPB Inhale 1 puff into the lungs every 12 (twelve) hours.      . formoterol (PERFOROMIST) 20 MCG/2ML nebulizer solution Take 20 mcg by nebulization 2  (two) times daily.      Marland Kitchen. ipratropium-albuterol (DUONEB) 0.5-2.5 (3) MG/3ML SOLN Take 3 mLs by nebulization every 6 (six) hours as needed.      . metFORMIN (GLUCOPHAGE) 500 MG tablet Take 1 tablet (500 mg total) by mouth 2 (two) times daily with a meal. Two times a day with meal.  180 tablet  3  . montelukast (SINGULAIR) 10 MG tablet Take 10 mg by mouth at bedtime.      . pravastatin (PRAVACHOL) 20 MG tablet Take 20 mg by mouth daily.      . theophylline (THEODUR) 200 MG 12 hr tablet Take 200 mg by mouth 2 (two) times daily.       No current facility-administered medications on file prior to visit.    Review of Systems Patient denies any headache, lightheadedness or dizziness.  No significant sinus or allergy symptoms.  No chest pain.  Breathing stable.   No acute sob.  Using her nebulizers and inhalers.  No increased cough.   No nausea or vomiting.  No abdominal pain or cramping.  No bowel change, such as diarrhea, constipation, BRBPR or melana.  No urine change.  Lower extremity swelling improved.  No increased heart rate or palpitations.  Objective:   Physical Exam  Filed Vitals:   07/27/13 0859  BP: 120/68  Pulse: 87  Temp: 98 F (74.44 C)   80 year old female in no acute distress.    HEENT:  Nares - clear.  OP- without lesions or erythema.  NECK:  Supple, nontender.     HEART:  Appears to be regular. LUNGS:   Increased air movement.  No increased congestion.  No increased wheezing.      RADIAL PULSE:  Equal bilaterally.  ABDOMEN:  Soft, nontender.     EXTREMITIES:  Swelling improved - resolved.   No increased redness or discoloration.                      Assessment & Plan:  SMOKING CESSATION.  Have discussed with her today the need to quit.  She desires not to quit at this time.     CARDIOVASCULAR.  Stable.    INCREASED PSYCHOSOCIAL STRESSORS.  Feels she is handling things relatively well.  Follow.    HISTORY OF AAA REPAIR.  Last ultrasound 2010.  Dr Maye Hides  reviewed.  He felt no further w/up for several years - then follow up with ultrasound.  (phone Dr Maye Hides - [phone]).    HEALTH MAINTENANCE.  Physical 12/05/11.  She declined GU and rectal exam.  Declined GI evaluation.  Will notify me when agreeable for bone density.  Mammogram 04/17/11 - BiRads II.  Will notify me when agreeable for f/u mammogram.

## 2013-07-31 NOTE — Assessment & Plan Note (Signed)
Declines follow up bone density.  Continue calcium and vitamin D.

## 2013-07-31 NOTE — Assessment & Plan Note (Signed)
Low cholesterol diet and exercise.  Follow lipid profile.    

## 2013-07-31 NOTE — Assessment & Plan Note (Signed)
Is followed by Dr Meredeth IdeFleming.   Just recently evaluated.  CXR as outlined.  Has f/u with Dr Meredeth IdeFleming.   Discussed with her regarding the need for further f/u and work up regarding the pleural effusion.  Discussed f/u cxr, CT scanning and cardiac w/up (including ECHO).   Continue O2.

## 2013-08-01 ENCOUNTER — Encounter: Payer: Self-pay | Admitting: Internal Medicine

## 2013-08-12 ENCOUNTER — Ambulatory Visit: Payer: Medicare Other | Admitting: Internal Medicine

## 2013-09-13 ENCOUNTER — Inpatient Hospital Stay: Payer: Self-pay | Admitting: Internal Medicine

## 2013-09-13 LAB — URINALYSIS, COMPLETE
BACTERIA: NONE SEEN
BILIRUBIN, UR: NEGATIVE
BLOOD: NEGATIVE
GLUCOSE, UR: NEGATIVE mg/dL (ref 0–75)
KETONE: NEGATIVE
Nitrite: NEGATIVE
Ph: 7 (ref 4.5–8.0)
Protein: NEGATIVE
RBC, UR: NONE SEEN /HPF (ref 0–5)
Specific Gravity: 1.004 (ref 1.003–1.030)
Squamous Epithelial: NONE SEEN

## 2013-09-13 LAB — THEOPHYLLINE LEVEL: Theophylline: 5.3 ug/mL — ABNORMAL LOW (ref 10.0–20.0)

## 2013-09-13 LAB — CBC
HCT: 40.8 % (ref 35.0–47.0)
HGB: 13.9 g/dL (ref 12.0–16.0)
MCH: 31.3 pg (ref 26.0–34.0)
MCHC: 34.1 g/dL (ref 32.0–36.0)
MCV: 92 fL (ref 80–100)
Platelet: 186 10*3/uL (ref 150–440)
RBC: 4.44 10*6/uL (ref 3.80–5.20)
RDW: 14.5 % (ref 11.5–14.5)
WBC: 14.2 10*3/uL — ABNORMAL HIGH (ref 3.6–11.0)

## 2013-09-13 LAB — BASIC METABOLIC PANEL
Anion Gap: 7 (ref 7–16)
BUN: 8 mg/dL (ref 7–18)
CHLORIDE: 107 mmol/L (ref 98–107)
CO2: 24 mmol/L (ref 21–32)
Calcium, Total: 9.1 mg/dL (ref 8.5–10.1)
Creatinine: 0.71 mg/dL (ref 0.60–1.30)
EGFR (African American): >60
EGFR (Non-African Amer.): >60
GLUCOSE: 191 mg/dL — AB (ref 65–99)
Osmolality: 279 (ref 275–301)
Potassium: 3.7 mmol/L (ref 3.5–5.1)
Sodium: 138 mmol/L (ref 136–145)

## 2013-09-13 LAB — TROPONIN I: Troponin-I: <0.02 ng/mL

## 2013-09-13 LAB — PRO B NATRIURETIC PEPTIDE: B-Type Natriuretic Peptide: 125 pg/mL (ref 0–450)

## 2013-09-14 LAB — BASIC METABOLIC PANEL
Anion Gap: 5 — ABNORMAL LOW (ref 7–16)
BUN: 16 mg/dL (ref 7–18)
CHLORIDE: 107 mmol/L (ref 98–107)
CO2: 24 mmol/L (ref 21–32)
CREATININE: 0.85 mg/dL (ref 0.60–1.30)
Calcium, Total: 9.2 mg/dL (ref 8.5–10.1)
EGFR (African American): >60
EGFR (Non-African Amer.): >60
GLUCOSE: 163 mg/dL — AB (ref 65–99)
Osmolality: 277 (ref 275–301)
Potassium: 4.4 mmol/L (ref 3.5–5.1)
SODIUM: 136 mmol/L (ref 136–145)

## 2013-09-14 LAB — CBC WITH DIFFERENTIAL/PLATELET
BASOS ABS: 0 10*3/uL (ref 0.0–0.1)
BASOS PCT: 0.4 %
EOS ABS: 0 10*3/uL (ref 0.0–0.7)
Eosinophil %: 0 %
HCT: 37 % (ref 35.0–47.0)
HGB: 13 g/dL (ref 12.0–16.0)
Lymphocyte #: 0.4 10*3/uL — ABNORMAL LOW (ref 1.0–3.6)
Lymphocyte %: 3.8 %
MCH: 32 pg (ref 26.0–34.0)
MCHC: 35 g/dL (ref 32.0–36.0)
MCV: 91 fL (ref 80–100)
Monocyte #: 0.5 x10 3/mm (ref 0.2–0.9)
Monocyte %: 4.1 %
NEUTROS ABS: 10.5 10*3/uL — AB (ref 1.4–6.5)
Neutrophil %: 91.7 %
Platelet: 196 10*3/uL (ref 150–440)
RBC: 4.05 10*6/uL (ref 3.80–5.20)
RDW: 14.7 % — AB (ref 11.5–14.5)
WBC: 11.4 10*3/uL — ABNORMAL HIGH (ref 3.6–11.0)

## 2013-09-17 LAB — CBC WITH DIFFERENTIAL/PLATELET
Comment - H1-Com1: NORMAL
HCT: 38.8 % (ref 35.0–47.0)
HGB: 13.6 g/dL (ref 12.0–16.0)
Lymphocytes: 6 %
MCH: 31.9 pg (ref 26.0–34.0)
MCHC: 35.2 g/dL (ref 32.0–36.0)
MCV: 91 fL (ref 80–100)
Monocytes: 1 %
Platelet: 211 10*3/uL (ref 150–440)
RBC: 4.28 10*6/uL (ref 3.80–5.20)
RDW: 13.9 % (ref 11.5–14.5)
Segmented Neutrophils: 93 %
WBC: 14.5 10*3/uL — ABNORMAL HIGH (ref 3.6–11.0)

## 2013-09-17 LAB — PROTIME-INR
INR: 1
Prothrombin Time: 12.8 secs (ref 11.5–14.7)

## 2013-09-17 LAB — BASIC METABOLIC PANEL
Anion Gap: 8 (ref 7–16)
BUN: 28 mg/dL — AB (ref 7–18)
CALCIUM: 8.9 mg/dL (ref 8.5–10.1)
CHLORIDE: 101 mmol/L (ref 98–107)
Co2: 24 mmol/L (ref 21–32)
Creatinine: 1.03 mg/dL (ref 0.60–1.30)
EGFR (Non-African Amer.): 51 — ABNORMAL LOW
GFR CALC AF AMER: 59 — AB
Glucose: 201 mg/dL — ABNORMAL HIGH (ref 65–99)
OSMOLALITY: 278 (ref 275–301)
POTASSIUM: 4.4 mmol/L (ref 3.5–5.1)
Sodium: 133 mmol/L — ABNORMAL LOW (ref 136–145)

## 2013-09-17 LAB — APTT: Activated PTT: 24.1 secs (ref 23.6–35.9)

## 2013-09-17 LAB — MAGNESIUM
MAGNESIUM: 2 mg/dL
Magnesium: 1.9 mg/dL

## 2013-09-17 LAB — PRO B NATRIURETIC PEPTIDE: B-Type Natriuretic Peptide: 273 pg/mL (ref 0–450)

## 2013-09-17 LAB — CK TOTAL AND CKMB (NOT AT ARMC)
CK, TOTAL: 39 U/L
CK, Total: 32 U/L
CK, Total: 34 U/L
CK-MB: 1 ng/mL (ref 0.5–3.6)
CK-MB: 1.5 ng/mL (ref 0.5–3.6)
CK-MB: 1.9 ng/mL (ref 0.5–3.6)

## 2013-09-17 LAB — THEOPHYLLINE LEVEL: Theophylline: 6 ug/mL — ABNORMAL LOW (ref 10.0–20.0)

## 2013-09-18 LAB — TROPONIN I
Troponin-I: 0.04 ng/mL
Troponin-I: 0.04 ng/mL

## 2013-09-18 LAB — CREATININE, SERUM
Creatinine: 1.03 mg/dL (ref 0.60–1.30)
EGFR (African American): 59 — ABNORMAL LOW
GFR CALC NON AF AMER: 51 — AB

## 2013-09-18 LAB — CULTURE, BLOOD (SINGLE)

## 2013-09-19 LAB — MAGNESIUM: Magnesium: 2.1 mg/dL

## 2013-09-19 LAB — POTASSIUM: POTASSIUM: 4.1 mmol/L (ref 3.5–5.1)

## 2013-09-19 LAB — SODIUM: Sodium: 135 mmol/L — ABNORMAL LOW (ref 136–145)

## 2013-09-20 ENCOUNTER — Telehealth: Payer: Self-pay | Admitting: Internal Medicine

## 2013-09-20 LAB — EXPECTORATED SPUTUM ASSESSMENT W GRAM STAIN, RFLX TO RESP C

## 2013-09-20 NOTE — Telephone Encounter (Signed)
Thanks

## 2013-09-20 NOTE — Telephone Encounter (Signed)
Spoke with pt, was admitted 09/13/13 from Dr. Reita ClicheFleming's office for COPD exacerbation, wheezing, shortness of breath. [facility]charged 09/19/13 from [facility]. Sent for records. She has been started on 1/2 tab Cipro with Benadryl due to pt's allergy and prednisone. Pt tolerating medication without difficulty. States she has improved during hospitalization, but continues to have chest congestion, cough and occasional wheezing. Denies fever. No new symptoms since discharge. Has scheduled followup appt with Dr. Welton FlakesKhan 09/27/13, still waiting to hear from Dr. Reita ClicheFleming's office regarding appt.

## 2013-09-20 NOTE — Telephone Encounter (Signed)
See if she can come in tomorrow at 11:30.  Thanks. (hospital f/u).

## 2013-09-20 NOTE — Telephone Encounter (Signed)
Please call and find out when she was discharged, what admitted for and how doing.  Thanks.  Need records.

## 2013-09-20 NOTE — Telephone Encounter (Signed)
Follow up scheduled tomorrow at 11.30 FYI

## 2013-09-20 NOTE — Telephone Encounter (Signed)
Pt left vm.  States she was discharged from hospital and was told to f/u with PCP in 2-3 days.  No appt available.  Please advise when pt can be seen.

## 2013-09-21 ENCOUNTER — Ambulatory Visit (INDEPENDENT_AMBULATORY_CARE_PROVIDER_SITE_OTHER): Payer: Medicare Other | Admitting: Internal Medicine

## 2013-09-21 ENCOUNTER — Encounter: Payer: Self-pay | Admitting: Internal Medicine

## 2013-09-21 VITALS — BP 110/70 | HR 111 | Temp 98.0°F | Ht 62.0 in | Wt 114.2 lb

## 2013-09-21 DIAGNOSIS — I4891 Unspecified atrial fibrillation: Secondary | ICD-10-CM

## 2013-09-21 DIAGNOSIS — J449 Chronic obstructive pulmonary disease, unspecified: Secondary | ICD-10-CM

## 2013-09-21 NOTE — Progress Notes (Signed)
Pre-visit discussion using our clinic review tool. No additional management support is needed unless otherwise documented below in the visit note.  

## 2013-09-21 NOTE — Patient Instructions (Signed)
Appointment with Dr Park BreedKahn today at 3:15.

## 2013-09-25 ENCOUNTER — Encounter: Payer: Self-pay | Admitting: Internal Medicine

## 2013-09-25 DIAGNOSIS — I4891 Unspecified atrial fibrillation: Secondary | ICD-10-CM | POA: Insufficient documentation

## 2013-09-25 NOTE — Assessment & Plan Note (Signed)
Had atrial fib in the hospital.  Converted on Tikosyn.  Was supposed to continue Tikosyn after discharge.  She never started.  Will hold on f/u EKG since not on the Tikosyn.  Discussed with Dr Park BreedKahn.  He is going to see her today and follow.  He will discuss Tikosyn therapy.  Follow.  Will need close monitoring on Tikosyn (especially while taking cipro).

## 2013-09-25 NOTE — Progress Notes (Signed)
Subjective:    Patient ID: Carla Harris, female    DOB: Oct 16, 1928, 80 y.o.   MRN: [id number]  HPI 80 year old female with past history of COPD/bronchiectasis, anemia and hypercholesterolemia who comes in today for a hospital follow up.   Was hospitalized 09/13/13 -09/19/13 for COPD exacerbation.   Was given abx and steroids.  Cough is better.  Breathing is better.  Uses her nebulizer and her inhalers.   She is eating and drinking well.  Uses her oxygen.  Denies any increased heart rate or palpitations since she has been discharged.  Had an episode of atrial fib with RVR while in the hospital.  Was evaluated by cardiology - Dr Park Breed.  Only converted with Tikosyn.  Was supposed to have continued Tikosyn at discharge.  Has never started.  No chest pain.  Bowels stable.  Still smoking.  Have again discussed the need to quit.  On cipro.  Being treated for pseudomonas bacterial infection.  Tolerating with benadryl.     Past Medical History  Diagnosis Date  . Anemia   . Diabetes mellitus without complication   . Bronchiectasis   . Colon polyps   . Hypercholesteremia   . S/P AAA repair   . Right rotator cuff tear   . COPD (chronic obstructive pulmonary disease)   . Low serum IgG1 and IgM levels     Current Outpatient Prescriptions on File Prior to Visit  Medication Sig Dispense Refill  . acetaminophen (TYLENOL) 500 MG tablet Take 500 mg by mouth every 6 (six) hours as needed.      Marland Kitchen albuterol (PROVENTIL HFA;VENTOLIN HFA) 108 (90 BASE) MCG/ACT inhaler Inhale 1 puff into the lungs every 6 (six) hours as needed for wheezing or shortness of breath.      Marland Kitchen albuterol-ipratropium (COMBIVENT) 18-103 MCG/ACT inhaler Inhale 2 puffs into the lungs every 6 (six) hours as needed.      . ALPRAZolam (XANAX) 0.25 MG tablet Take 0.25 mg by mouth every 8 (eight) hours as needed.      Marland Kitchen aspirin EC 81 MG tablet Take 81 mg by mouth daily.      . cetirizine (ZYRTEC) 10 MG tablet Take 10 mg by mouth daily.      .  Cholecalciferol (VITAMIN D3) 2000 UNITS capsule Take 2,000 Units by mouth daily.      Marland Kitchen esomeprazole (NEXIUM) 40 MG capsule Take 40 mg by mouth daily before breakfast. Take 1 capsule twice a day      . Fluticasone-Salmeterol (ADVAIR) 250-50 MCG/DOSE AEPB Inhale 1 puff into the lungs every 12 (twelve) hours.      . formoterol (PERFOROMIST) 20 MCG/2ML nebulizer solution Take 20 mcg by nebulization 2 (two) times daily.      Marland Kitchen ipratropium-albuterol (DUONEB) 0.5-2.5 (3) MG/3ML SOLN Take 3 mLs by nebulization every 6 (six) hours as needed.      . metFORMIN (GLUCOPHAGE) 500 MG tablet Take 1 tablet (500 mg total) by mouth 2 (two) times daily with a meal. Two times a day with meal.  180 tablet  3  . montelukast (SINGULAIR) 10 MG tablet Take 10 mg by mouth at bedtime.      . pravastatin (PRAVACHOL) 20 MG tablet Take 20 mg by mouth daily.      . predniSONE (DELTASONE) 10 MG tablet Take 10 mg by mouth daily with breakfast.      . theophylline (THEODUR) 200 MG 12 hr tablet Take 200 mg by mouth 2 (two) times daily.      Marland Kitchen  tiotropium (SPIRIVA) 18 MCG inhalation capsule Place 18 mcg into inhaler and inhale daily.       No current facility-administered medications on file prior to visit.    Review of Systems Patient denies any headache, lightheadedness or dizziness.  No significant sinus or allergy symptoms.  No chest pain. Using her nebulizers and inhalers.  Cough and congestion better.  Still with increased congestion.  No nausea or vomiting. No abdominal pain or cramping.  No bowel change, such as diarrhea, constipation, BRBPR or melana.  No urine change. No increased heart rate or palpitations now.  Had atrial fib in the hospital.  See above.        Objective:   Physical Exam  Filed Vitals:   09/21/13 1119  BP: 110/70  Pulse: 111  Temp: 98 F (7636.777 C)   80 year old female in no acute distress.    HEENT:  Nares - clear.  OP- without lesions or erythema.  NECK:  Supple, nontender.     HEART:   Appears to be regular. LUNGS:   Increased air movement.  No increased congestion.  No increased wheezing.       RADIAL PULSE:  Equal bilaterally.  ABDOMEN:  Soft, nontender.     EXTREMITIES:  Swelling improved - resolved.                      Assessment & Plan:  SMOKING CESSATION.  Have discussed with her today the need to quit.  She desires not to quit at this time.   Has cut down.   INCREASED PSYCHOSOCIAL STRESSORS.  Feels she is handling things relatively well.  Follow.    HISTORY OF AAA REPAIR.  Last ultrasound 2010.  Dr Maye HidesMcCann reviewed.  He felt no further w/up for several years - then follow up with ultrasound.  (phone Dr Maye HidesMcCann - [phone]).    HEALTH MAINTENANCE.  Physical 12/05/11.  She declined GU and rectal exam.  Declined GI evaluation.  Will notify me when agreeable for bone density.  Mammogram 04/17/11 - BiRads II.  Will notify me when agreeable for f/u mammogram.    I spent 40 minutes with the patient and more than 50% of the time was spent in consultation regarding the above.

## 2013-09-25 NOTE — Assessment & Plan Note (Signed)
Continue follow up with Dr Meredeth IdeFleming.  Complete cipro and prednisone taper.  Continue nebs and inhalers.  Cough and congestion better.  Continues on home O2.

## 2013-11-23 ENCOUNTER — Emergency Department: Payer: Self-pay | Admitting: Emergency Medicine

## 2013-12-01 ENCOUNTER — Ambulatory Visit: Payer: Self-pay | Admitting: Nurse Practitioner

## 2013-12-13 ENCOUNTER — Ambulatory Visit: Payer: Self-pay | Admitting: Internal Medicine

## 2013-12-17 ENCOUNTER — Telehealth: Payer: Self-pay | Admitting: Internal Medicine

## 2013-12-17 NOTE — Telephone Encounter (Signed)
Noted  

## 2013-12-17 NOTE — Telephone Encounter (Signed)
Spoke with pt, she fell on 11/23/13 trying to catch her puppy outside, fell onto sidewalk and broke left shoulder. Was seen in ED and has been following up with April Berndt, at [facility] No surgery needed. Next follow up in 4-6 weeks. She is doing well and improving. Nothing needed from our office just an [place]

## 2013-12-17 NOTE — Telephone Encounter (Signed)
Patient called and stated that she fell and broke her shoulder. She advised that she just wanted Dr. Lorin Picket to know/msn

## 2013-12-29 ENCOUNTER — Telehealth: Payer: Self-pay | Admitting: Internal Medicine

## 2013-12-29 NOTE — Telephone Encounter (Signed)
Pt left vm.  States she needs rx for 30 day supply of aspirin.  States she also needs to speak with Dr. Lorin PicketScott.  No further details given.

## 2013-12-29 NOTE — Telephone Encounter (Signed)
Pt states that she thought her daughter had an appt with Dr. Lorin PicketScott today & she was going to ask us to send her printed ASA Rx by her for a 90 day supply [facility]. Pt realized that Dr. Lorin PicketScott was not in office & said to disregard message because she doesn't have a way to pick it up before Saturday.

## 2014-01-03 ENCOUNTER — Inpatient Hospital Stay: Payer: Self-pay | Admitting: Internal Medicine

## 2014-01-03 LAB — COMPREHENSIVE METABOLIC PANEL
ALBUMIN: 3.4 g/dL (ref 3.4–5.0)
ANION GAP: 11 (ref 7–16)
Alkaline Phosphatase: 70 U/L
BUN: 7 mg/dL (ref 7–18)
Bilirubin,Total: 0.6 mg/dL (ref 0.2–1.0)
CO2: 23 mmol/L (ref 21–32)
Calcium, Total: 9.3 mg/dL (ref 8.5–10.1)
Chloride: 103 mmol/L (ref 98–107)
Creatinine: 0.85 mg/dL (ref 0.60–1.30)
EGFR (Non-African Amer.): >60
GLUCOSE: 202 mg/dL — AB (ref 65–99)
Osmolality: 278 (ref 275–301)
Potassium: 3.8 mmol/L (ref 3.5–5.1)
SGOT(AST): 8 U/L — ABNORMAL LOW (ref 15–37)
SGPT (ALT): 14 U/L (ref 12–78)
Sodium: 137 mmol/L (ref 136–145)
Total Protein: 6.2 g/dL — ABNORMAL LOW (ref 6.4–8.2)

## 2014-01-03 LAB — CBC
HCT: 42.4 % (ref 35.0–47.0)
HGB: 13.6 g/dL (ref 12.0–16.0)
MCH: 29.5 pg (ref 26.0–34.0)
MCHC: 32.1 g/dL (ref 32.0–36.0)
MCV: 92 fL (ref 80–100)
Platelet: 240 10*3/uL (ref 150–440)
RBC: 4.62 10*6/uL (ref 3.80–5.20)
RDW: 14 % (ref 11.5–14.5)
WBC: 12.5 10*3/uL — ABNORMAL HIGH (ref 3.6–11.0)

## 2014-01-03 LAB — MAGNESIUM: MAGNESIUM: 1.8 mg/dL

## 2014-01-03 LAB — PRO B NATRIURETIC PEPTIDE: B-TYPE NATIURETIC PEPTID: 92 pg/mL (ref 0–450)

## 2014-01-03 LAB — TROPONIN I: Troponin-I: <0.02 ng/mL

## 2014-01-04 DIAGNOSIS — J9600 Acute respiratory failure, unspecified whether with hypoxia or hypercapnia: Secondary | ICD-10-CM

## 2014-01-04 LAB — CBC WITH DIFFERENTIAL/PLATELET
BASOS ABS: 0 10*3/uL (ref 0.0–0.1)
Basophil %: 0.2 %
EOS ABS: 0 10*3/uL (ref 0.0–0.7)
Eosinophil %: 0 %
HCT: 38.9 % (ref 35.0–47.0)
HGB: 13.3 g/dL (ref 12.0–16.0)
LYMPHS ABS: 0.3 10*3/uL — AB (ref 1.0–3.6)
Lymphocyte %: 3.2 %
MCH: 31.1 pg (ref 26.0–34.0)
MCHC: 34.3 g/dL (ref 32.0–36.0)
MCV: 91 fL (ref 80–100)
MONO ABS: 0.1 x10 3/mm — AB (ref 0.2–0.9)
Monocyte %: 1 %
Neutrophil #: 9.6 10*3/uL — ABNORMAL HIGH (ref 1.4–6.5)
Neutrophil %: 95.6 %
PLATELETS: 218 10*3/uL (ref 150–440)
RBC: 4.29 10*6/uL (ref 3.80–5.20)
RDW: 13.9 % (ref 11.5–14.5)
WBC: 10 10*3/uL (ref 3.6–11.0)

## 2014-01-04 LAB — BASIC METABOLIC PANEL
ANION GAP: 8 (ref 7–16)
BUN: 17 mg/dL (ref 7–18)
CALCIUM: 9.8 mg/dL (ref 8.5–10.1)
CHLORIDE: 103 mmol/L (ref 98–107)
CO2: 25 mmol/L (ref 21–32)
Creatinine: 0.78 mg/dL (ref 0.60–1.30)
EGFR (African American): >60
EGFR (Non-African Amer.): >60
Glucose: 202 mg/dL — ABNORMAL HIGH (ref 65–99)
OSMOLALITY: 279 (ref 275–301)
Potassium: 4 mmol/L (ref 3.5–5.1)
Sodium: 136 mmol/L (ref 136–145)

## 2014-01-06 LAB — THEOPHYLLINE LEVEL: Theophylline: 7.7 ug/mL — ABNORMAL LOW (ref 10.0–20.0)

## 2014-01-08 LAB — PLATELET COUNT: Platelet: 192 10*3/uL (ref 150–440)

## 2014-01-08 LAB — CULTURE, BLOOD (SINGLE)

## 2014-01-10 ENCOUNTER — Ambulatory Visit (HOSPITAL_COMMUNITY)
Admission: AD | Admit: 2014-01-10 | Discharge: 2014-01-10 | Disposition: A | Discharge status: Home / Self Care | Payer: Medicare Other | Source: Other Acute Inpatient Hospital | Attending: Internal Medicine | Admitting: Internal Medicine

## 2014-01-10 ENCOUNTER — Inpatient Hospital Stay
Admission: AD | Admit: 2014-01-10 | Discharge: 2014-01-31 | Disposition: A | Discharge status: Home / Self Care | Payer: Medicare Other | Source: Ambulatory Visit | Attending: Internal Medicine | Admitting: Internal Medicine

## 2014-01-10 DIAGNOSIS — E119 Type 2 diabetes mellitus without complications: Secondary | ICD-10-CM | POA: Diagnosis present

## 2014-01-10 DIAGNOSIS — J9600 Acute respiratory failure, unspecified whether with hypoxia or hypercapnia: Secondary | ICD-10-CM | POA: Insufficient documentation

## 2014-01-10 DIAGNOSIS — M810 Age-related osteoporosis without current pathological fracture: Secondary | ICD-10-CM | POA: Diagnosis present

## 2014-01-10 DIAGNOSIS — J189 Pneumonia, unspecified organism: Secondary | ICD-10-CM

## 2014-01-10 DIAGNOSIS — J479 Bronchiectasis, uncomplicated: Secondary | ICD-10-CM | POA: Diagnosis present

## 2014-01-10 DIAGNOSIS — J449 Chronic obstructive pulmonary disease, unspecified: Secondary | ICD-10-CM | POA: Diagnosis present

## 2014-01-10 DIAGNOSIS — J9601 Acute respiratory failure with hypoxia: Secondary | ICD-10-CM

## 2014-01-10 DIAGNOSIS — K219 Gastro-esophageal reflux disease without esophagitis: Secondary | ICD-10-CM

## 2014-01-10 DIAGNOSIS — I4891 Unspecified atrial fibrillation: Secondary | ICD-10-CM | POA: Diagnosis present

## 2014-01-10 DIAGNOSIS — J418 Mixed simple and mucopurulent chronic bronchitis: Secondary | ICD-10-CM

## 2014-01-10 DIAGNOSIS — J438 Other emphysema: Secondary | ICD-10-CM

## 2014-01-10 DIAGNOSIS — J9620 Acute and chronic respiratory failure, unspecified whether with hypoxia or hypercapnia: Secondary | ICD-10-CM

## 2014-01-11 ENCOUNTER — Other Ambulatory Visit (HOSPITAL_COMMUNITY): Payer: Self-pay

## 2014-01-11 LAB — COMPREHENSIVE METABOLIC PANEL
ALBUMIN: 2.7 g/dL — AB (ref 3.5–5.2)
ALT: 11 U/L (ref 0–35)
AST: 10 U/L (ref 0–37)
Alkaline Phosphatase: 44 U/L (ref 39–117)
BUN: 37 mg/dL — ABNORMAL HIGH (ref 6–23)
CO2: 27 meq/L (ref 19–32)
CREATININE: 0.84 mg/dL (ref 0.50–1.10)
Calcium: 8.4 mg/dL (ref 8.4–10.5)
Chloride: 100 mEq/L (ref 96–112)
GFR calc Af Amer: 73 mL/min — ABNORMAL LOW (ref >90)
GFR, EST NON AFRICAN AMERICAN: 63 mL/min — AB (ref >90)
Glucose, Bld: 102 mg/dL — ABNORMAL HIGH (ref 70–99)
Potassium: 4.9 mEq/L (ref 3.7–5.3)
SODIUM: 139 meq/L (ref 137–147)
Total Bilirubin: 0.4 mg/dL (ref 0.3–1.2)
Total Protein: 5.2 g/dL — ABNORMAL LOW (ref 6.0–8.3)

## 2014-01-11 LAB — PROCALCITONIN

## 2014-01-11 LAB — TSH: TSH: 2.88 u[IU]/mL (ref 0.350–4.500)

## 2014-01-11 LAB — PRO B NATRIURETIC PEPTIDE: Pro B Natriuretic peptide (BNP): 286.3 pg/mL (ref 0–450)

## 2014-01-11 LAB — CBC
HEMATOCRIT: 42.8 % (ref 36.0–46.0)
Hemoglobin: 14.5 g/dL (ref 12.0–15.0)
MCH: 30.7 pg (ref 26.0–34.0)
MCHC: 33.9 g/dL (ref 30.0–36.0)
MCV: 90.5 fL (ref 78.0–100.0)
Platelets: 175 10*3/uL (ref 150–400)
RBC: 4.73 MIL/uL (ref 3.87–5.11)
RDW: 13.4 % (ref 11.5–15.5)
WBC: 14.4 10*3/uL — ABNORMAL HIGH (ref 4.0–10.5)

## 2014-01-11 LAB — D-DIMER, QUANTITATIVE (NOT AT ARMC): D-Dimer, Quant: 0.51 ug/mL-FEU — ABNORMAL HIGH (ref 0.00–0.48)

## 2014-01-11 LAB — PREALBUMIN: Prealbumin: 26.2 mg/dL (ref 17.0–34.0)

## 2014-01-11 LAB — SEDIMENTATION RATE: Sed Rate: 0 mm/hr (ref 0–22)

## 2014-01-12 ENCOUNTER — Ambulatory Visit: Payer: Self-pay | Admitting: Internal Medicine

## 2014-01-12 ENCOUNTER — Encounter: Payer: Self-pay | Admitting: Radiology

## 2014-01-12 ENCOUNTER — Other Ambulatory Visit (HOSPITAL_COMMUNITY): Payer: Self-pay

## 2014-01-12 MED ORDER — IOHEXOL 350 MG/ML SOLN
80.0000 mL | Freq: Once | INTRAVENOUS | Status: AC | PRN
  Administered 2014-01-12: 80 mL via INTRAVENOUS

## 2014-01-13 DIAGNOSIS — J411 Mucopurulent chronic bronchitis: Secondary | ICD-10-CM

## 2014-01-13 DIAGNOSIS — J9600 Acute respiratory failure, unspecified whether with hypoxia or hypercapnia: Secondary | ICD-10-CM

## 2014-01-13 DIAGNOSIS — J9620 Acute and chronic respiratory failure, unspecified whether with hypoxia or hypercapnia: Secondary | ICD-10-CM

## 2014-01-13 DIAGNOSIS — I4891 Unspecified atrial fibrillation: Secondary | ICD-10-CM

## 2014-01-13 DIAGNOSIS — J438 Other emphysema: Secondary | ICD-10-CM

## 2014-01-13 DIAGNOSIS — J189 Pneumonia, unspecified organism: Secondary | ICD-10-CM

## 2014-01-13 DIAGNOSIS — K219 Gastro-esophageal reflux disease without esophagitis: Secondary | ICD-10-CM

## 2014-01-13 NOTE — Consult Note (Signed)
PULMONARY / CRITICAL CARE MEDICINE   Name: Carla Harris MRN: [id number] DOB: [birth year]    ADMISSION DATE:  01/10/2014 CONSULTATION DATE:  01/13/2014  PRIMARY SERVICE: [facility]  CHIEF COMPLAINT: Hypoxemic respiratory failure  HISTORY OF PRESENT ILLNESS:  80 yo active smoker since age 16 with oxygen- dependent COPD ( 2-3 l/min), DNR status admitted to [facility] 6/22 with AECOPD and was followed by pulmonary and palliative care. Transferred to [facility] on 6/29 and continues to require high flow oxygen with FiO2 of 65%. She is also treated for AF RVR.   PAST MEDICAL HISTORY :  Past Medical History  Diagnosis Date  . Anemia   . Diabetes mellitus without complication   . Bronchiectasis   . Colon polyps   . Hypercholesteremia   . S/P AAA repair   . Right rotator cuff tear   . COPD (chronic obstructive pulmonary disease)   . Low serum IgG1 and IgM levels    Past Surgical History  Procedure Laterality Date  . Appendectomy    . Abdominal hysterectomy    . Abdominal aortic aneurysm repair    . Rotator cuff repair     Prior to Admission medications   Medication Sig Start Date End Date Taking? Authorizing Provider  acetaminophen (TYLENOL) 500 MG tablet Take 500 mg by mouth every 6 (six) hours as needed.    Historical Provider, MD  albuterol (PROVENTIL HFA;VENTOLIN HFA) 108 (90 BASE) MCG/ACT inhaler Inhale 1 puff into the lungs every 6 (six) hours as needed for wheezing or shortness of breath.    Historical Provider, MD  albuterol-ipratropium (COMBIVENT) 18-103 MCG/ACT inhaler Inhale 2 puffs into the lungs every 6 (six) hours as needed.    Historical Provider, MD  ALPRAZolam Prudy Feeler) 0.25 MG tablet Take 0.25 mg by mouth every 8 (eight) hours as needed. 07/14/12   Charm Barges, MD  aspirin EC 81 MG tablet Take 81 mg by mouth daily.    Historical Provider, MD  cetirizine (ZYRTEC) 10 MG tablet Take 10 mg by mouth daily.    Historical Provider, MD  Cholecalciferol (VITAMIN D3) 2000 UNITS capsule Take  2,000 Units by mouth daily.    Historical Provider, MD  esomeprazole (NEXIUM) 40 MG capsule Take 40 mg by mouth daily before breakfast. Take 1 capsule twice a day    Historical Provider, MD  Fluticasone-Salmeterol (ADVAIR) 250-50 MCG/DOSE AEPB Inhale 1 puff into the lungs every 12 (twelve) hours.    Historical Provider, MD  formoterol (PERFOROMIST) 20 MCG/2ML nebulizer solution Take 20 mcg by nebulization 2 (two) times daily.    Historical Provider, MD  ipratropium-albuterol (DUONEB) 0.5-2.5 (3) MG/3ML SOLN Take 3 mLs by nebulization every 6 (six) hours as needed.    Historical Provider, MD  metFORMIN (GLUCOPHAGE) 500 MG tablet Take 1 tablet (500 mg total) by mouth 2 (two) times daily with a meal. Two times a day with meal. 03/26/13   Charm Barges, MD  montelukast (SINGULAIR) 10 MG tablet Take 10 mg by mouth at bedtime.    Historical Provider, MD  pravastatin (PRAVACHOL) 20 MG tablet Take 20 mg by mouth daily.    Historical Provider, MD  predniSONE (DELTASONE) 10 MG tablet Take 10 mg by mouth daily with breakfast.    Historical Provider, MD  theophylline (THEODUR) 200 MG 12 hr tablet Take 200 mg by mouth 2 (two) times daily.    Historical Provider, MD  tiotropium (SPIRIVA) 18 MCG inhalation capsule Place 18 mcg into inhaler and inhale daily.  Historical Provider, MD   Allergies  Allergen Reactions  . Advil [Ibuprofen] Swelling  . Ciprofloxacin   . Levaquin [Levofloxacin In D5w]   . Penicillins   . Septra [Sulfamethoxazole-Trimethoprim] Other (See Comments)    Itching and swelling of the face and hands  . Sulfa Antibiotics   . Doxycycline Itching   FAMILY HISTORY:  Family History  Problem Relation Age of Onset  . COPD Mother   . Liver cancer Sister   . Colon cancer Maternal Grandmother    SOCIAL HISTORY:  reports that she has been smoking Cigarettes.  She has been smoking about 0.00 packs per day. She has never used smokeless tobacco. She reports that she does not drink alcohol or  use illicit drugs.  REVIEW OF SYSTEMS:  10 point review of system taken, please see HPI for positives and negatives.  INTERVAL HISTORY:   VITAL SIGNS:  Reviewed.  PHYSICAL EXAMINATION: General:  No distress, wearing high flow oxygen cannula Neuro:  Awake, alert HEENT:  No JVD Cardiovascular:  Irregular, no murmurs Lungs:  Bilateral air entry, no wheezes / rales Abdomen:  Soft, non tender Musculoskeletal:  Moves all extremities, no edema Skin:  Intact  LABS:  CBC  Recent Labs Lab 01/11/14 0700  WBC 14.4*  HGB 14.5  HCT 42.8  PLT 175   Coag's No results found for this basename: APTT, INR,  in the last 168 hours  BMET  Recent Labs Lab 01/11/14 0700  NA 139  K 4.9  CL 100  CO2 27  BUN 37*  CREATININE 0.84  GLUCOSE 102*   Electrolytes  Recent Labs Lab 01/11/14 0700  CALCIUM 8.4   Sepsis Markers  Recent Labs Lab 01/11/14 0700  PROCALCITON <0.10   ABG No results found for this basename: PHART, PCO2ART, PO2ART,  in the last 168 hours  Liver Enzymes  Recent Labs Lab 01/11/14 0700  AST 10  ALT 11  ALKPHOS 44  BILITOT 0.4  ALBUMIN 2.7*   Cardiac Enzymes  Recent Labs Lab 01/11/14 0700  PROBNP 286.3   Glucose No results found for this basename: GLUCAP,  in the last 168 hours  IMAGING:  Ct Angio Chest Pe W/cm &/or Wo Cm  01/12/2014   CLINICAL DATA:  Shortness of breath. Evaluate for pulmonary embolus.  EXAM: CT ANGIOGRAPHY CHEST WITH CONTRAST  TECHNIQUE: Multidetector CT imaging of the chest was performed using the standard protocol during bolus administration of intravenous contrast. Multiplanar CT image reconstructions and MIPs were obtained to evaluate the vascular anatomy.  CONTRAST:  80mL OMNIPAQUE IOHEXOL 350 MG/ML SOLN  COMPARISON:  None.  FINDINGS: The heart size appears normal. No pericardial effusion identified. There is atherosclerotic disease involving the thoracic aorta as well as the left main, LAD, left circumflex and RCA coronary  arteries. The main pulmonary artery appears patent. No saddle embolus. There is no lobar or segmental pulmonary artery filling defects identified to suggest an acute pulmonary embolus.  There is airspace consolidation and atelectasis involving both lower lobes. There are filling defects within the posterior basal segments of both lower lobe bronchi. Moderate to advanced changes of centrilobular emphysema noted.  No axillary or supraclavicular adenopathy. Incidental imaging through the upper abdomen is on unremarkable.  Review of the visualized osseous structures is significant for osteopenia and multilevel degenerative disc disease.  Review of the MIP images confirms the above findings.  IMPRESSION: 1. No acute stress set no evidence for acute pulmonary embolus. 2. Atherosclerotic disease including 3 vessel coronary artery  calcification. 3. Bilateral lower lobe airspace consolidation and atelectasis. Abnormal filling defects within the posterior basal spot portions of the lower lobe bronchi noted which may represent aspiration or mucous impaction. Recommend followup imaging after aggressive pulmonary toilet and appropriate antibiotic therapy to ensure resolution. If there is no improved aeration to the airways then further assessment with bronchoscopy should be considered. 4. Emphysema.   Electronically Signed   By: Signa Kellaylor  Stroud M.D.   On: 01/12/2014 01:14   ASSESSMENT AND PLAN:  Acute on chronic hypoxemic respiratory failure COPD (chronic obstructive pulmonary disease) Bilateral lower lobe consolidation, likely atelectasis, possibly pneumonia DNR No evidence of copd exacerbation Pulmonary embolism ruled out   Supplemental oxygen  Goal SpO2>92  Bronchodilators  No indication for systemic steroids  Mucomyst, would schedule with bronchodilators  Chest physical therapy with vest  Repeat PCXR  Check PCT  Antibiotics per primary team  Do not think she is a good candidate for bronchoscopy as high  oxygen requirements and likely needs to be intubated for that  May consider TTE to evaluate PAP but this is unlikely to change management even if pulmonary hypertension is documented  Brett CanalesSteve Minor ACNP [facility] Pager [phone] till 3 pm If no answer page [phone] 01/13/2014, 10:07 AM  I have personally obtained history, examined patient, evaluated and interpreted laboratory and imaging results, reviewed medical records, formulated assessment / plan and placed orders.  Lonia FarberZUBELEVITSKIY, Zenab Gronewold, MD Pulmonary and Critical Care Medicine [facility] Pager: [phone]  01/13/2014, 10:31 AM

## 2014-01-14 DIAGNOSIS — I517 Cardiomegaly: Secondary | ICD-10-CM

## 2014-01-14 LAB — BASIC METABOLIC PANEL
Anion gap: 11 (ref 5–15)
BUN: 17 mg/dL (ref 6–23)
CHLORIDE: 101 meq/L (ref 96–112)
CO2: 27 mEq/L (ref 19–32)
Calcium: 8.5 mg/dL (ref 8.4–10.5)
Creatinine, Ser: 0.55 mg/dL (ref 0.50–1.10)
GFR calc non Af Amer: 85 mL/min — ABNORMAL LOW (ref >90)
Glucose, Bld: 68 mg/dL — ABNORMAL LOW (ref 70–99)
POTASSIUM: 4 meq/L (ref 3.7–5.3)
Sodium: 139 mEq/L (ref 137–147)

## 2014-01-14 LAB — CBC
HCT: 39 % (ref 36.0–46.0)
Hemoglobin: 13.1 g/dL (ref 12.0–15.0)
MCH: 30 pg (ref 26.0–34.0)
MCHC: 33.6 g/dL (ref 30.0–36.0)
MCV: 89.2 fL (ref 78.0–100.0)
PLATELETS: 192 10*3/uL (ref 150–400)
RBC: 4.37 MIL/uL (ref 3.87–5.11)
RDW: 13.4 % (ref 11.5–15.5)
WBC: 21.2 10*3/uL — AB (ref 4.0–10.5)

## 2014-01-16 ENCOUNTER — Other Ambulatory Visit (HOSPITAL_COMMUNITY): Payer: Self-pay

## 2014-01-16 LAB — CBC WITH DIFFERENTIAL/PLATELET
Basophils Absolute: 0 10*3/uL (ref 0.0–0.1)
Basophils Relative: 0 % (ref 0–1)
EOS ABS: 0 10*3/uL (ref 0.0–0.7)
Eosinophils Relative: 0 % (ref 0–5)
HCT: 37.6 % (ref 36.0–46.0)
HEMOGLOBIN: 12.9 g/dL (ref 12.0–15.0)
LYMPHS ABS: 1.2 10*3/uL (ref 0.7–4.0)
LYMPHS PCT: 6 % — AB (ref 12–46)
MCH: 30.6 pg (ref 26.0–34.0)
MCHC: 34.3 g/dL (ref 30.0–36.0)
MCV: 89.3 fL (ref 78.0–100.0)
Monocytes Absolute: 1.3 10*3/uL — ABNORMAL HIGH (ref 0.1–1.0)
Monocytes Relative: 6 % (ref 3–12)
NEUTROS ABS: 18.2 10*3/uL — AB (ref 1.7–7.7)
NEUTROS PCT: 88 % — AB (ref 43–77)
PLATELETS: 212 10*3/uL (ref 150–400)
RBC: 4.21 MIL/uL (ref 3.87–5.11)
RDW: 13.5 % (ref 11.5–15.5)
WBC: 20.8 10*3/uL — AB (ref 4.0–10.5)

## 2014-01-17 ENCOUNTER — Other Ambulatory Visit (HOSPITAL_COMMUNITY): Payer: Self-pay

## 2014-01-17 DIAGNOSIS — J9620 Acute and chronic respiratory failure, unspecified whether with hypoxia or hypercapnia: Secondary | ICD-10-CM

## 2014-01-17 NOTE — Progress Notes (Addendum)
[facility]                                                                                              Progress note     Patient Demographics  Carla Harris, is a 80 y.o. female  ZOX:[id number]  WJX:[id number]  DOB - 08/26/1928  Admit date - 01/10/2014  Admitting Physician Carron CurieAli Naveen Clardy, MD  Outpatient Primary MD for the patient is [name],[name], MD  LOS - 7   CC   Respiratory failure   COPD   Pneumonia         Subjective:   Carla Harris reports shortness of breath with is chronic no chest pains no nausea vomiting or diarrhea . Wants to smoke a cigarette  Objective:   Vital signs  Temperature 97.4 Heart rate 59 Respiratory rate 18 Blood pressure 129/50 Pulse ox 97%    Exam Awake Alert,  [place].AT,PERRAL Supple Neck,No JVD, No cervical lymphadenopathy appriciated.  Symmetrical Chest wall movement, diminished, coarse rhonchi with wheezing RRR,No Gallops,Rubs or new Murmurs, No Parasternal Heave +ve B.Sounds, Abd Soft, Non tender, No organomegaly appreciated, No rebound - guarding or rigidity. No Cyanosis, Clubbing or edema, No new Rash or bruise     I&Os unknown     Data Review   CBC  Recent Labs Lab 01/11/14 0700 01/14/14 0555 01/16/14 0605  WBC 14.4* 21.2* 20.8*  HGB 14.5 13.1 12.9  HCT 42.8 39.0 37.6  PLT 175 192 212  MCV 90.5 89.2 89.3  MCH 30.7 30.0 30.6  MCHC 33.9 33.6 34.3  RDW 13.4 13.4 13.5  LYMPHSABS  --   --  1.2  MONOABS  --   --  1.3*  EOSABS  --   --  0.0  BASOSABS  --   --  0.0    Chemistries   Recent Labs Lab 01/11/14 0700 01/14/14 0555  NA 139 139  K 4.9 4.0  CL 100 101  CO2 27 27  GLUCOSE 102* 68*  BUN 37* 17  CREATININE 0.84 0.55  CALCIUM 8.4 8.5  AST 10  --   ALT 11  --   ALKPHOS 44  --   BILITOT 0.4  --     ------------------------------------------------------------------------------------------------------------------ CrCl is unknown because both a height and weight (above a minimum accepted value) are required for this calculation. ------------------------------------------------------------------------------------------------------------------ No results found for this basename: HGBA1C,  in the last 72 hours ------------------------------------------------------------------------------------------------------------------ No results found for this basename: CHOL, HDL, LDLCALC, TRIG, CHOLHDL, LDLDIRECT,  in the last 72 hours ------------------------------------------------------------------------------------------------------------------ No results found for this basename: TSH, T4TOTAL, FREET3, T3FREE, THYROIDAB,  in the last 72 hours ------------------------------------------------------------------------------------------------------------------ No results found for this basename: VITAMINB12, FOLATE, FERRITIN, TIBC, IRON, RETICCTPCT,  in the last 72 hours  Coagulation profile No results found for this basename: INR, PROTIME,  in the last 168 hours  No results found for this basename: DDIMER,  in the last 72 hours  Cardiac Enzymes No results found for this basename: CK, CKMB, TROPONINI, MYOGLOBIN,  in the last 168 hours ------------------------------------------------------------------------------------------------------------------ No components found with this basename: POCBNP,   Micro Results No results found  for this or any previous visit (from the past 240 hour(s)).   Chest x-ray shows left-sided pneumonia, multilobar  Assessment & Plan   Respiratory failure on the basis of advanced COPD on high flow oxygen  when necessary . continue chest PT COPD exacerbation Aspiration pneumonia left side on IV Abxs Protein malnutrition tolerating by mouth Generalized weakness continue  with PT OT Diabetes mellitus type 2 continue Levemir and NovoLog worse due to steroids GERD continue PPI Hypertension continue with Cardizem Lt Shoulder Fx; left humerus around 6 weeks ago  Plan  Consulted Dr. Lajoyce Cornersuda for left shoulder fracture Change high flow to 4 L Oxymizer  Code Status: DNR    DVT Prophylaxis  Leverne HumblesXarelto   Rheta Hemmelgarn M.D on 01/17/2014 at 11:27 AM

## 2014-01-17 NOTE — Progress Notes (Signed)
PULMONARY / CRITICAL CARE MEDICINE   Name: Carla Harris C [place] MRN: [id number] DOB: 07/07/1929    ADMISSION DATE:  01/10/2014 CONSULTATION DATE:  01/13/2014  PRIMARY SERVICE: [facility]  CHIEF COMPLAINT: Hypoxemic respiratory failure  HISTORY OF PRESENT ILLNESS:  80 yo active smoker since age 80 with oxygen- dependent COPD ( 2-3 l/min), [facility] with AECOPD and was followed by pulmonary and palliative care. [facility] and continues to require high flow oxygen with FiO2 of 65%. She is also treated for AF RVR.   INTERVAL HISTORY:  FIO2 down to 45 % VITAL SIGNS:  Vital signs reviewed. Abnormal values will appear under impression plan section.    PHYSICAL EXAMINATION: General:  No distress, wearing high flow oxygen cannula 45% Neuro:  Awake, alert HEENT:  No JVD Cardiovascular:  Irregular, no murmurs Lungs:  Bilateral air entry, no wheezes / rales/diminshed in bases Abdomen:  Soft, non tender Musculoskeletal:  Moves all extremities, no edema Skin:  Intact  LABS:  CBC  Recent Labs Lab 01/11/14 0700 01/14/14 0555 01/16/14 0605  WBC 14.4* 21.2* 20.8*  HGB 14.5 13.1 12.9  HCT 42.8 39.0 37.6  PLT 175 192 212   Coag's No results found for this basename: APTT, INR,  in the last 168 hours  BMET  Recent Labs Lab 01/11/14 0700 01/14/14 0555  NA 139 139  K 4.9 4.0  CL 100 101  CO2 27 27  BUN 37* 17  CREATININE 0.84 0.55  GLUCOSE 102* 68*   Electrolytes  Recent Labs Lab 01/11/14 0700 01/14/14 0555  CALCIUM 8.4 8.5   Sepsis Markers  Recent Labs Lab 01/11/14 0700  PROCALCITON <0.10   ABG No results found for this basename: PHART, PCO2ART, PO2ART,  in the last 168 hours  Liver Enzymes  Recent Labs Lab 01/11/14 0700  AST 10  ALT 11  ALKPHOS 44  BILITOT 0.4  ALBUMIN 2.7*   Cardiac Enzymes  Recent Labs Lab 01/11/14 0700  PROBNP 286.3   Glucose No results found for this basename: GLUCAP,  in the last 168  hours  IMAGING:  Dg Chest Port 1 View  01/16/2014   CLINICAL DATA:  Aspiration  EXAM: PORTABLE CHEST - 1 VIEW  COMPARISON:  CTA chest dated 01/12/2014  FINDINGS: Mild patchy left upper lobe opacity, some of which may reflect underlying scarring, although superimposed pneumonia is suspected.  Mild bibasilar opacities, likely atelectasis. Medial left lower lobe consolidation/atelectasis is unchanged from recent CT. Small bilateral pleural effusions. No pneumothorax.  The heart is normal in size.  Comminuted left humeral neck fracture.  IMPRESSION: Mild patchy left upper lobe opacity, suspicious for pneumonia.  Medial left lower lobe consolidation/atelectasis, unchanged from recent CT.  Small bilateral pleural effusions.   Electronically Signed   By: Charline BillsSriyesh  Krishnan M.D.   On: 01/16/2014 15:51   ASSESSMENT AND PLAN:  Acute on chronic hypoxemic respiratory failure COPD (chronic obstructive pulmonary disease) Bilateral lower lobe consolidation, likely atelectasis, possibly pneumonia DNR No evidence of copd exacerbation Pulmonary embolism ruled out Left humeral Fx   Supplemental oxygen  Goal SpO2>92  Bronchodilators  No indication for systemic steroids  Mucomyst, would schedule with bronchodilators  Chest physical therapy with vest  Repeat PCXR  Check PCT <.10  Antibiotics per primary team  Do not think she is a good candidate for bronchoscopy as high oxygen requirements and likely needs to be intubated for that  May consider TTE to evaluate PAP but this is unlikely  to change management even if pulmonary hypertension is documented.             PCCM will be available PRN.  Brett CanalesSteve Minor ACNP [facility] Pager [phone] till 3 pm If no answer page [phone] 01/17/2014, 9:13 AM  PCCM will sign off, please call back if needed.  Patient seen and examined, agree with above note.  I dictated the care and orders written for this patient under my direction.  Alyson ReedyWesam G Ty Oshima, MD [phone]

## 2014-01-18 NOTE — Progress Notes (Signed)
[facility]                                                                                              Progress note     Patient Demographics  Carla Harris, is a 80 y.o. female  [id number]  [id number]  DOB - 10/18/1928  Admit date - 01/10/2014  Admitting Physician Carron CurieAli Linde Wilensky, MD  Outpatient Primary MD for the patient is [name],[name], MD  LOS - 8   CC   Respiratory failure   COPD   Pneumonia         Subjective:   Carla Harris feels a lot better today. Denies chest pains. Still wants a cigarette  Objective:   Vital signs  Temperature 96 Heart rate 64 Respiratory rate 16 Blood pressure 118/52 Pulse ox 95%    Exam Awake Alert,  [place].AT,PERRAL Supple Neck,No JVD, No cervical lymphadenopathy appriciated.  Symmetrical Chest wall movement, diminished, coarse rhonchi with wheezing RRR,No Gallops,Rubs or new Murmurs, No Parasternal Heave +ve B.Sounds, Abd Soft, Non tender, No organomegaly appreciated, No rebound - guarding or rigidity. No Cyanosis, Clubbing or edema, No new Rash or bruise     I&Os unknown     Data Review   CBC  Recent Labs Lab 01/14/14 0555 01/16/14 0605  WBC 21.2* 20.8*  HGB 13.1 12.9  HCT 39.0 37.6  PLT 192 212  MCV 89.2 89.3  MCH 30.0 30.6  MCHC 33.6 34.3  RDW 13.4 13.5  LYMPHSABS  --  1.2  MONOABS  --  1.3*  EOSABS  --  0.0  BASOSABS  --  0.0    Chemistries   Recent Labs Lab 01/14/14 0555  NA 139  K 4.0  CL 101  CO2 27  GLUCOSE 68*  BUN 17  CREATININE 0.55  CALCIUM 8.5   ------------------------------------------------------------------------------------------------------------------ CrCl is unknown because both a height and weight (above a minimum accepted value) are required for this calculation. ------------------------------------------------------------------------------------------------------------------ No  results found for this basename: HGBA1C,  in the last 72 hours ------------------------------------------------------------------------------------------------------------------ No results found for this basename: CHOL, HDL, LDLCALC, TRIG, CHOLHDL, LDLDIRECT,  in the last 72 hours ------------------------------------------------------------------------------------------------------------------ No results found for this basename: TSH, T4TOTAL, FREET3, T3FREE, THYROIDAB,  in the last 72 hours ------------------------------------------------------------------------------------------------------------------ No results found for this basename: VITAMINB12, FOLATE, FERRITIN, TIBC, IRON, RETICCTPCT,  in the last 72 hours  Coagulation profile No results found for this basename: INR, PROTIME,  in the last 168 hours  No results found for this basename: DDIMER,  in the last 72 hours  Cardiac Enzymes No results found for this basename: CK, CKMB, TROPONINI, MYOGLOBIN,  in the last 168 hours ------------------------------------------------------------------------------------------------------------------ No components found with this basename: POCBNP,   Micro Results No results found for this or any previous visit (from the past 240 hour(s)).   Chest x-ray shows left-sided pneumonia, multilobar  Assessment & Plan   Respiratory failure on the basis of advanced COPD on high flow oxygen  when necessary . continue chest PT COPD exacerbation Aspiration pneumonia left side on IV Abxs Protein malnutrition tolerating by mouth Generalized weakness continue with PT OT Diabetes  mellitus type 2 continue Levemir and NovoLog worse due to steroids GERD continue PPI Hypertension continue with Cardizem Lt Shoulder Fx; left humerus around 6 weeks ago , seen by Dr. Lajoyce Cornersuda, no restrictions, DC sling, PT/OT  Plan  Continue IV antibiotics Continue same management  Code Status: DNR    DVT Prophylaxis   Leverne HumblesXarelto   Yanni Ruberg M.D on 01/18/2014 at 12:22 PM

## 2014-01-18 NOTE — Consult Note (Signed)
Reason for Consult: Left proximal humerus fracture Referring Physician: Dr Waldron LabsHijazi  Carla Harris is an 80 y.o. female.  HPI: Patient is a 80 year old woman who states that she fell when she was caring for a new dog on May 12. States she landed on her left shoulder denies any head trauma.  Past Medical History  Diagnosis Date  . Anemia   . Diabetes mellitus without complication   . Bronchiectasis   . Colon polyps   . Hypercholesteremia   . S/P AAA repair   . Right rotator cuff tear   . COPD (chronic obstructive pulmonary disease)   . Low serum IgG1 and IgM levels     Past Surgical History  Procedure Laterality Date  . Appendectomy    . Abdominal hysterectomy    . Abdominal aortic aneurysm repair    . Rotator cuff repair      Family History  Problem Relation Age of Onset  . COPD Mother   . Liver cancer Sister   . Colon cancer Maternal Grandmother     Social History:  reports that she has been smoking Cigarettes.  She has been smoking about 0.00 packs per day. She has never used smokeless tobacco. She reports that she does not drink alcohol or use illicit drugs.  Allergies:  Allergies  Allergen Reactions  . Advil [Ibuprofen] Swelling  . Ciprofloxacin   . Levaquin [Levofloxacin In D5w]   . Penicillins   . Septra [Sulfamethoxazole-Trimethoprim] Other (See Comments)    Itching and swelling of the face and hands  . Sulfa Antibiotics   . Doxycycline Itching    Medications: I have reviewed the patient's current medications.  No results found for this or any previous visit (from the past 48 hour(s)).  Dg Shoulder 1v Left  01/17/2014   CLINICAL DATA:  Fall.  EXAM: LEFT SHOULDER - 1 VIEW  COMPARISON:  CT 12/01/2013.  FINDINGS: There is a comminuted fracture of the subcapital region of the left proximal humerus. This was present on prior CT of 12/01/2013. A component of callus may be present.  IMPRESSION: Comminuted fracture of the subcapital region of the left proximal  humerus as noted on prior CT of 12/01/2013. A component of callus may be present. The fracture is nondisplaced on single AP view.   Electronically Signed   By: Maisie Fushomas  Register   On: 01/17/2014 16:07   Dg Chest Port 1 View  01/16/2014   CLINICAL DATA:  Aspiration  EXAM: PORTABLE CHEST - 1 VIEW  COMPARISON:  CTA chest dated 01/12/2014  FINDINGS: Mild patchy left upper lobe opacity, some of which may reflect underlying scarring, although superimposed pneumonia is suspected.  Mild bibasilar opacities, likely atelectasis. Medial left lower lobe consolidation/atelectasis is unchanged from recent CT. Small bilateral pleural effusions. No pneumothorax.  The heart is normal in size.  Comminuted left humeral neck fracture.  IMPRESSION: Mild patchy left upper lobe opacity, suspicious for pneumonia.  Medial left lower lobe consolidation/atelectasis, unchanged from recent CT.  Small bilateral pleural effusions.   Electronically Signed   By: Charline BillsSriyesh  Krishnan M.D.   On: 01/16/2014 15:51    Review of Systems  All other systems reviewed and are negative.  Last menstrual period 07/01/1969. Physical Exam On examination patient's left upper extremity is neurovascularly intact. She has decreased range of motion of the shoulder. Radiographs shows an impacted proximal humerus fracture on the left with a congruent joint of the shoulder. Assessment/Plan: Assessment: 2 months status post impacted left proximal humerus  fracture.  Plan: Will have the sling discontinued. Patient will start physical therapy and occupational therapy for range of motion and strengthening of the left shoulder. She has no restrictions. I will followup as needed.  DUDA,MARCUS V 01/18/2014, 6:23 AM

## 2014-01-19 LAB — THEOPHYLLINE LEVEL: Theophylline Lvl: 12.7 ug/mL (ref 10.0–20.0)

## 2014-01-19 NOTE — Progress Notes (Signed)
[facility]                                                                                              Progress note     Patient Demographics  Carla Harris, is a 80 y.o. female  ZOX:[id number]  WJX:[id number]  DOB - 08/17/1928  Admit date - 01/10/2014  Admitting Physician Carron CurieAli Yahmir Sokolov, MD  Outpatient Primary MD for the patient is [name],[name], MD  LOS - 9   CC   Respiratory failure   COPD   Pneumonia         Subjective:   Carla Harris feels a lot better today. Denies chest pains, shortness of breath, nausea, vomiting or diarrhea  Objective:   Vital signs  Temperature 96.5 Heart rate 107 Respiratory rate 18 Blood pressure 124/64 Pulse ox 95%    Exam Awake Alert,  [place].AT,PERRAL Supple Neck,No JVD, No cervical lymphadenopathy appriciated.  Symmetrical Chest wall movement, diminished, no wheezing or rhonchi RRR,No Gallops,Rubs or new Murmurs, No Parasternal Heave +ve B.Sounds, Abd Soft, Non tender, No organomegaly appreciated, No rebound - guarding or rigidity. No Cyanosis, Clubbing or edema, No new Rash or bruise     I&Os unknown     Data Review   CBC  Recent Labs Lab 01/14/14 0555 01/16/14 0605  WBC 21.2* 20.8*  HGB 13.1 12.9  HCT 39.0 37.6  PLT 192 212  MCV 89.2 89.3  MCH 30.0 30.6  MCHC 33.6 34.3  RDW 13.4 13.5  LYMPHSABS  --  1.2  MONOABS  --  1.3*  EOSABS  --  0.0  BASOSABS  --  0.0    Chemistries   Recent Labs Lab 01/14/14 0555  NA 139  K 4.0  CL 101  CO2 27  GLUCOSE 68*  BUN 17  CREATININE 0.55  CALCIUM 8.5   ------------------------------------------------------------------------------------------------------------------ CrCl is unknown because both a height and weight (above a minimum accepted value) are required for this  calculation. ------------------------------------------------------------------------------------------------------------------ No results found for this basename: HGBA1C,  in the last 72 hours ------------------------------------------------------------------------------------------------------------------ No results found for this basename: CHOL, HDL, LDLCALC, TRIG, CHOLHDL, LDLDIRECT,  in the last 72 hours ------------------------------------------------------------------------------------------------------------------ No results found for this basename: TSH, T4TOTAL, FREET3, T3FREE, THYROIDAB,  in the last 72 hours ------------------------------------------------------------------------------------------------------------------ No results found for this basename: VITAMINB12, FOLATE, FERRITIN, TIBC, IRON, RETICCTPCT,  in the last 72 hours  Coagulation profile No results found for this basename: INR, PROTIME,  in the last 168 hours  No results found for this basename: DDIMER,  in the last 72 hours  Cardiac Enzymes No results found for this basename: CK, CKMB, TROPONINI, MYOGLOBIN,  in the last 168 hours ------------------------------------------------------------------------------------------------------------------ No components found with this basename: POCBNP,   Micro Results No results found for this or any previous visit (from the past 240 hour(s)).   Chest x-ray shows left-sided pneumonia, multilobar  Assessment & Plan   Respiratory failure on the basis of advanced COPD on high flow oxygen  when necessary . continue chest PT COPD exacerbation Aspiration pneumonia left side on IV Abxs Protein malnutrition tolerating by mouth Generalized weakness continue with  PT OT Diabetes mellitus type 2 continue Levemir and NovoLog worse due to steroids GERD continue PPI Hypertension continue with Cardizem Lt Shoulder Fx; left humerus around 6 weeks ago , seen by Dr. Lajoyce Cornersuda, no  restrictions, DC sling, PT/OT  Plan  Check labs in a.m.  Code Status: DNR    DVT Prophylaxis  Leverne HumblesXarelto   [name] M.D on 01/19/2014 at 12:05 PM

## 2014-01-20 ENCOUNTER — Other Ambulatory Visit (HOSPITAL_COMMUNITY): Payer: Self-pay

## 2014-01-20 LAB — BASIC METABOLIC PANEL
Anion gap: 12 (ref 5–15)
BUN: 18 mg/dL (ref 6–23)
CHLORIDE: 99 meq/L (ref 96–112)
CO2: 26 mEq/L (ref 19–32)
Calcium: 8.2 mg/dL — ABNORMAL LOW (ref 8.4–10.5)
Creatinine, Ser: 0.52 mg/dL (ref 0.50–1.10)
GFR calc Af Amer: >90 mL/min (ref >90)
GFR, EST NON AFRICAN AMERICAN: 87 mL/min — AB (ref >90)
GLUCOSE: 142 mg/dL — AB (ref 70–99)
Potassium: 3.8 mEq/L (ref 3.7–5.3)
Sodium: 137 mEq/L (ref 137–147)

## 2014-01-20 LAB — CBC
HCT: 34.3 % — ABNORMAL LOW (ref 36.0–46.0)
HEMOGLOBIN: 11.7 g/dL — AB (ref 12.0–15.0)
MCH: 30.5 pg (ref 26.0–34.0)
MCHC: 34.1 g/dL (ref 30.0–36.0)
MCV: 89.6 fL (ref 78.0–100.0)
Platelets: 158 10*3/uL (ref 150–400)
RBC: 3.83 MIL/uL — ABNORMAL LOW (ref 3.87–5.11)
RDW: 13.8 % (ref 11.5–15.5)
WBC: 15.2 10*3/uL — ABNORMAL HIGH (ref 4.0–10.5)

## 2014-01-20 LAB — PROCALCITONIN: Procalcitonin: <0.1 ng/mL

## 2014-01-20 NOTE — Progress Notes (Signed)
[facility]                                                                                              Progress note     Patient Demographics  Carla Harris, is a 80 y.o. female  [id number]  [id number]  DOB - 04/07/1929  Admit date - 01/10/2014  Admitting Physician Carron CurieAli Yoni Lobos, MD  Outpatient Primary MD for the patient is Carla S, MD  LOS - 10   CC   Respiratory failure   COPD   Pneumonia         Subjective:   [name] feels a lot better today. Denies chest pains, shortness of breath, nausea, vomiting or diarrhea  Objective:   Vital signs  Temperature 97.9 Heart rate 65 Respiratory rate 18 Blood pressure 132/60 Pulse ox 99%     Exam Awake Alert,  [place].AT,PERRAL Supple Neck,No JVD, No cervical lymphadenopathy appriciated.  Symmetrical Chest wall movement, diminished, no wheezing or rhonchi RRR,No Gallops,Rubs or new Murmurs, No Parasternal Heave +ve B.Sounds, Abd Soft, Non tender, No organomegaly appreciated, No rebound - guarding or rigidity. No Cyanosis, Clubbing or edema, No new Rash or bruise     I&Os unknown     Data Review   CBC  Recent Labs Lab 01/14/14 0555 01/16/14 0605 01/20/14 0430  WBC 21.2* 20.8* 15.2*  HGB 13.1 12.9 11.7*  HCT 39.0 37.6 34.3*  PLT 192 212 158  MCV 89.2 89.3 89.6  MCH 30.0 30.6 30.5  MCHC 33.6 34.3 34.1  RDW 13.4 13.5 13.8  LYMPHSABS  --  1.2  --   MONOABS  --  1.3*  --   EOSABS  --  0.0  --   BASOSABS  --  0.0  --     Chemistries   Recent Labs Lab 01/14/14 0555 01/20/14 0430  NA 139 137  K 4.0 3.8  CL 101 99  CO2 27 26  GLUCOSE 68* 142*  BUN 17 18  CREATININE 0.55 0.52  CALCIUM 8.5 8.2*   ------------------------------------------------------------------------------------------------------------------ CrCl is unknown because both a height and weight (above a minimum accepted value) are  required for this calculation. ------------------------------------------------------------------------------------------------------------------ No results found for this basename: HGBA1C,  in the last 72 hours ------------------------------------------------------------------------------------------------------------------ No results found for this basename: CHOL, HDL, LDLCALC, TRIG, CHOLHDL, LDLDIRECT,  in the last 72 hours ------------------------------------------------------------------------------------------------------------------ No results found for this basename: TSH, T4TOTAL, FREET3, T3FREE, THYROIDAB,  in the last 72 hours ------------------------------------------------------------------------------------------------------------------ No results found for this basename: VITAMINB12, FOLATE, FERRITIN, TIBC, IRON, RETICCTPCT,  in the last 72 hours  Coagulation profile No results found for this basename: INR, PROTIME,  in the last 168 hours  No results found for this basename: DDIMER,  in the last 72 hours  Cardiac Enzymes No results found for this basename: CK, CKMB, TROPONINI, MYOGLOBIN,  in the last 168 hours ------------------------------------------------------------------------------------------------------------------ No components found with this basename: POCBNP,   Micro Results No results found for this or any previous visit (from the past 240 hour(Harris)).   Chest x-ray shows left-sided pneumonia, multilobar  Assessment & Plan   Respiratory failure on the  basis of advanced COPD on high flow oxygen  when necessary . continue chest PT COPD exacerbation Aspiration pneumonia left side on IV Abxs Protein malnutrition tolerating by mouth Generalized weakness continue with PT OT Diabetes mellitus type 2 continue Levemir and NovoLog worse due to steroids GERD continue PPI Hypertension continue with Cardizem Lt Shoulder Fx; left humerus around 6 weeks ago , seen by Dr.  Lajoyce Corners, no restrictions, DC sling, PT/OT Leukocytosis improving  Plan  Continue tapering prednisone Continue IV antibiotics  Code Status: DNR    DVT Prophylaxis  [name] M.D on 01/20/2014 at 11:05 AM

## 2014-01-21 ENCOUNTER — Other Ambulatory Visit (HOSPITAL_COMMUNITY): Payer: Self-pay

## 2014-01-21 NOTE — Progress Notes (Signed)
[facility]                                                                                              Progress note     Patient Demographics  Carla Harris, is a 80 y.o. female  ZHY:[id number]  [id number]  DOB - 07/28/1928  Admit date - 01/10/2014  Admitting Physician Carla Curie, MD  Outpatient Primary MD for the patient is Carla Barges, MD  LOS - 11   CC   Respiratory failure   COPD   Pneumonia         Subjective:   Carla Harris has no new complaints  Objective:   Vital signs  Temperature 96.8 Heart rate 67 Respiratory rate 14 Blood pressure 96/62 Pulse ox 97%     Exam Awake Alert,  .AT,PERRAL Supple Neck,No JVD, No cervical lymphadenopathy appriciated.  Symmetrical Chest wall movement, diminished, no wheezing or rhonchi RRR,No Gallops,Rubs or new Murmurs, No Parasternal Heave +ve B.Sounds, Abd Soft, Non tender, No organomegaly appreciated, No rebound - guarding or rigidity. No Cyanosis, Clubbing or edema, No new Rash or bruise     I&Os unknown     Data Review   CBC  Recent Labs Lab 01/16/14 0605 01/20/14 0430  WBC 20.8* 15.2*  HGB 12.9 11.7*  HCT 37.6 34.3*  PLT 212 158  MCV 89.3 89.6  MCH 30.6 30.5  MCHC 34.3 34.1  RDW 13.5 13.8  LYMPHSABS 1.2  --   MONOABS 1.3*  --   EOSABS 0.0  --   BASOSABS 0.0  --     Chemistries   Recent Labs Lab 01/20/14 0430  NA 137  K 3.8  CL 99  CO2 26  GLUCOSE 142*  BUN 18  CREATININE 0.52  CALCIUM 8.2*   ------------------------------------------------------------------------------------------------------------------ CrCl is unknown because both a height and weight (above a minimum accepted value) are required for this calculation. ------------------------------------------------------------------------------------------------------------------ No results found for this basename: HGBA1C,  in the  last 72 hours ------------------------------------------------------------------------------------------------------------------ No results found for this basename: CHOL, HDL, LDLCALC, TRIG, CHOLHDL, LDLDIRECT,  in the last 72 hours ------------------------------------------------------------------------------------------------------------------ No results found for this basename: TSH, T4TOTAL, FREET3, T3FREE, THYROIDAB,  in the last 72 hours ------------------------------------------------------------------------------------------------------------------ No results found for this basename: VITAMINB12, FOLATE, FERRITIN, TIBC, IRON, RETICCTPCT,  in the last 72 hours  Coagulation profile No results found for this basename: INR, PROTIME,  in the last 168 hours  No results found for this basename: DDIMER,  in the last 72 hours  Cardiac Enzymes No results found for this basename: CK, CKMB, TROPONINI, MYOGLOBIN,  in the last 168 hours ------------------------------------------------------------------------------------------------------------------ No components found with this basename: POCBNP,   Micro Results No results found for this or any previous visit (from the past 240 hour(s)).   Chest x-ray shows left-sided pneumonia, multilobar  Assessment & Plan   Respiratory failure on the basis of advanced COPD on high flow oxygen  when necessary . continue chest PT COPD exacerbation Aspiration pneumonia left side on IV Abxs Protein malnutrition tolerating by mouth Generalized weakness continue with PT OT Diabetes mellitus type 2 continue Levemir and NovoLog  worse due to steroids GERD continue PPI Hypertension continue with Cardizem Lt Shoulder Fx; left humerus around 6 weeks ago , seen by Dr. Lajoyce Harris, no restrictions, DC sling, PT/OT Leukocytosis improving  Plan  Continue tapering prednisone Continue IV antibiotics  Code Status: DNR    DVT Prophylaxis  Carla HumblesXarelto   Carla Harris M.D  on 01/21/2014 at 10:07 AM

## 2014-01-22 NOTE — Progress Notes (Signed)
[facility]                                                                                              Progress note     Patient Demographics  Carla Harris, is a 80 y.o. female  WJX:[id number]  [id number]:[id number]  DOB - 1929/05/08  Admit date - 01/10/2014  Admitting Physician Carron Curie, MD  Outpatient Primary MD for the patient is [name],[name], MD  LOS - 12   CC   Respiratory failure   COPD   Pneumonia         Subjective:   Carla Harris has no new complaints  Objective:   Vital signs  Temperature 97.9 Heart rate 55 Respiratory rate 16 Blood pressure 114/67 Pulse ox 96%     Exam Awake Alert,  [place].AT,PERRAL Supple Neck,No JVD, No cervical lymphadenopathy appriciated.  Symmetrical Chest wall movement, diminished, no wheezing or rhonchi RRR,No Gallops,Rubs or new Murmurs, No Parasternal Heave +ve B.Sounds, Abd Soft, Non tender, No organomegaly appreciated, No rebound - guarding or rigidity. No Cyanosis, Clubbing or edema, No new Rash or bruise     I&Os unknown     Data Review   CBC  Recent Labs Lab 01/16/14 0605 01/20/14 0430  WBC 20.8* 15.2*  HGB 12.9 11.7*  HCT 37.6 34.3*  PLT 212 158  MCV 89.3 89.6  MCH 30.6 30.5  MCHC 34.3 34.1  RDW 13.5 13.8  LYMPHSABS 1.2  --   MONOABS 1.3*  --   EOSABS 0.0  --   BASOSABS 0.0  --     Chemistries   Recent Labs Lab 01/20/14 0430  NA 137  K 3.8  CL 99  CO2 26  GLUCOSE 142*  BUN 18  CREATININE 0.52  CALCIUM 8.2*   ------------------------------------------------------------------------------------------------------------------ CrCl is unknown because both a height and weight (above a minimum accepted value) are required for this calculation. ------------------------------------------------------------------------------------------------------------------ No results found for this basename: HGBA1C,  in the  last 72 hours ------------------------------------------------------------------------------------------------------------------ No results found for this basename: CHOL, HDL, LDLCALC, TRIG, CHOLHDL, LDLDIRECT,  in the last 72 hours ------------------------------------------------------------------------------------------------------------------ No results found for this basename: TSH, T4TOTAL, FREET3, T3FREE, THYROIDAB,  in the last 72 hours ------------------------------------------------------------------------------------------------------------------ No results found for this basename: VITAMINB12, FOLATE, FERRITIN, TIBC, IRON, RETICCTPCT,  in the last 72 hours  Coagulation profile No results found for this basename: INR, PROTIME,  in the last 168 hours  No results found for this basename: DDIMER,  in the last 72 hours  Cardiac Enzymes No results found for this basename: CK, CKMB, TROPONINI, MYOGLOBIN,  in the last 168 hours ------------------------------------------------------------------------------------------------------------------ No components found with this basename: POCBNP,   Micro Results No results found for this or any previous visit (from the past 240 hour(s)).   Chest x-ray shows left-sided pneumonia, multilobar  Assessment & Plan   Respiratory failure on the basis of advanced COPD on high flow oxygen  when necessary .  COPD exacerbation Aspiration pneumonia left side on IV Abxs Protein malnutrition tolerating by mouth Generalized weakness continue with PT OT Diabetes mellitus type 2 continue Levemir and NovoLog worse due  to steroids GERD continue PPI Hypertension continue with Cardizem Lt Shoulder Fx; left humerus around 6 weeks ago , seen by Dr. Lajoyce Cornersuda, no restrictions, DC sling, PT/OT Leukocytosis improving  Plan  Continue tapering prednisone Continue IV antibiotics  Code Status: DNR    DVT Prophylaxis  Leverne HumblesXarelto   Abdalla Naramore M.D on 01/22/2014 at  11:31 AM

## 2014-01-23 NOTE — Progress Notes (Signed)
[facility]                                                                                              Progress note     Patient Demographics  Carla Harris, is a 80 y.o. female  ZOX:[id number]:[id number]  WJX:[id number]  DOB - 01/25/1929  Admit date - 01/10/2014  Admitting Physician Carron CurieAli Keiley Levey, MD  Outpatient Primary MD for the patient is [name],[name], MD  LOS - 13   CC   Respiratory failure   COPD   Pneumonia         Subjective:   Carla Harris complains of nasal drip and irritation due to oxygen  Objective:   Vital signs  Temperature 97.8 Heart rate 104 Respiratory rate 18 Blood pressure 118/69 Pulse ox 94%  Exam Awake Alert,  [place].AT,PERRAL Supple Neck,No JVD, No cervical lymphadenopathy appriciated.  Symmetrical Chest wall movement, diminished, no wheezing or rhonchi RRR,No Gallops,Rubs or new Murmurs, No Parasternal Heave +ve B.Sounds, Abd Soft, Non tender, No organomegaly appreciated, No rebound - guarding or rigidity. No Cyanosis, Clubbing or edema, No new Rash or bruise     I&Os unknown     Data Review   CBC  Recent Labs Lab 01/20/14 0430  WBC 15.2*  HGB 11.7*  HCT 34.3*  PLT 158  MCV 89.6  MCH 30.5  MCHC 34.1  RDW 13.8    Chemistries   Recent Labs Lab 01/20/14 0430  NA 137  K 3.8  CL 99  CO2 26  GLUCOSE 142*  BUN 18  CREATININE 0.52  CALCIUM 8.2*   ------------------------------------------------------------------------------------------------------------------ CrCl is unknown because both a height and weight (above a minimum accepted value) are required for this calculation. ------------------------------------------------------------------------------------------------------------------ No results found for this basename: HGBA1C,  in the last 72  hours ------------------------------------------------------------------------------------------------------------------ No results found for this basename: CHOL, HDL, LDLCALC, TRIG, CHOLHDL, LDLDIRECT,  in the last 72 hours ------------------------------------------------------------------------------------------------------------------ No results found for this basename: TSH, T4TOTAL, FREET3, T3FREE, THYROIDAB,  in the last 72 hours ------------------------------------------------------------------------------------------------------------------ No results found for this basename: VITAMINB12, FOLATE, FERRITIN, TIBC, IRON, RETICCTPCT,  in the last 72 hours  Coagulation profile No results found for this basename: INR, PROTIME,  in the last 168 hours  No results found for this basename: DDIMER,  in the last 72 hours  Cardiac Enzymes No results found for this basename: CK, CKMB, TROPONINI, MYOGLOBIN,  in the last 168 hours ------------------------------------------------------------------------------------------------------------------ No components found with this basename: POCBNP,   Micro Results No results found for this or any previous visit (from the past 240 hour(s)).   Chest x-ray shows left-sided pneumonia, multilobar  Assessment & Plan   Respiratory failure on the basis of advanced COPD on nasal cannula and high flow oxygen  when necessary .  COPD exacerbation continue with nebulizer treatments and steroids Aspiration pneumonia left side on IV Abxs Protein malnutrition tolerating by mouth Generalized weakness continue with PT OT Diabetes mellitus type 2 continue Levemir and NovoLog worse due to steroids GERD continue PPI Hypertension continue with Cardizem Lt Shoulder Fx; left humerus around 6 weeks ago , seen by Dr. Lajoyce Cornersuda,  no restrictions, DC sling, PT/OT Leukocytosis improving  Plan  Continue tapering prednisone Continue IV antibiotics  Start nasal saline spray Check  labs and chest x-ray in a.m. Code Status: DNR    DVT Prophylaxis  [name] M.D on 01/23/2014 at 1:02 PM

## 2014-01-24 ENCOUNTER — Other Ambulatory Visit (HOSPITAL_COMMUNITY): Payer: Self-pay

## 2014-01-24 LAB — BASIC METABOLIC PANEL WITH GFR
Anion gap: 8 (ref 5–15)
BUN: 22 mg/dL (ref 6–23)
CO2: 30 meq/L (ref 19–32)
Calcium: 8.7 mg/dL (ref 8.4–10.5)
Chloride: 103 meq/L (ref 96–112)
Creatinine, Ser: 0.61 mg/dL (ref 0.50–1.10)
GFR calc Af Amer: >90 mL/min
GFR calc non Af Amer: 82 mL/min — ABNORMAL LOW
Glucose, Bld: 94 mg/dL (ref 70–99)
Potassium: 5.1 meq/L (ref 3.7–5.3)
Sodium: 141 meq/L (ref 137–147)

## 2014-01-24 LAB — CBC
HCT: 33.6 % — ABNORMAL LOW (ref 36.0–46.0)
Hemoglobin: 11.3 g/dL — ABNORMAL LOW (ref 12.0–15.0)
MCH: 30.4 pg (ref 26.0–34.0)
MCHC: 33.6 g/dL (ref 30.0–36.0)
MCV: 90.3 fL (ref 78.0–100.0)
Platelets: 172 10*3/uL (ref 150–400)
RBC: 3.72 MIL/uL — ABNORMAL LOW (ref 3.87–5.11)
RDW: 14.2 % (ref 11.5–15.5)
WBC: 18 10*3/uL — ABNORMAL HIGH (ref 4.0–10.5)

## 2014-01-26 NOTE — Progress Notes (Signed)
[facility]                                                                                              Progress note     Patient Demographics  Angelique Holmatsy Tang, is a 80 y.o. female  MVH:[id number]:[id number]  WUX:[id number]  DOB - 03/29/1929  Admit date - 01/10/2014  Admitting Physician Carron CurieAli Etheline Geppert, MD  Outpatient Primary MD for the patient is [name],[name], MD  LOS - 16   CC   Respiratory failure   COPD   Pneumonia         Subjective:   Demyah Burback has no new complaints  Objective:   Vital signs  Temperature 97 Heart rate 66 Respiratory rate 16 Blood pressure 108/68 Pulse ox 98%  Exam Awake Alert,  [place].AT,PERRAL Supple Neck,No JVD, No cervical lymphadenopathy appriciated.  Symmetrical Chest wall movement, diminished, no wheezing or rhonchi RRR,No Gallops,Rubs or new Murmurs, No Parasternal Heave +ve B.Sounds, Abd Soft, Non tender, No organomegaly appreciated, No rebound - guarding or rigidity. No Cyanosis, Clubbing or edema, No new Rash or bruise     I&Os unknown     Data Review   CBC  Recent Labs Lab 01/20/14 0430 01/24/14 0635  WBC 15.2* 18.0*  HGB 11.7* 11.3*  HCT 34.3* 33.6*  PLT 158 172  MCV 89.6 90.3  MCH 30.5 30.4  MCHC 34.1 33.6  RDW 13.8 14.2    Chemistries   Recent Labs Lab 01/20/14 0430 01/24/14 0635  NA 137 141  K 3.8 5.1  CL 99 103  CO2 26 30  GLUCOSE 142* 94  BUN 18 22  CREATININE 0.52 0.61  CALCIUM 8.2* 8.7   ------------------------------------------------------------------------------------------------------------------ CrCl is unknown because both a height and weight (above a minimum accepted value) are required for this calculation. ------------------------------------------------------------------------------------------------------------------ No results found for this basename: HGBA1C,  in the last 72  hours ------------------------------------------------------------------------------------------------------------------ No results found for this basename: CHOL, HDL, LDLCALC, TRIG, CHOLHDL, LDLDIRECT,  in the last 72 hours ------------------------------------------------------------------------------------------------------------------ No results found for this basename: TSH, T4TOTAL, FREET3, T3FREE, THYROIDAB,  in the last 72 hours ------------------------------------------------------------------------------------------------------------------ No results found for this basename: VITAMINB12, FOLATE, FERRITIN, TIBC, IRON, RETICCTPCT,  in the last 72 hours  Coagulation profile No results found for this basename: INR, PROTIME,  in the last 168 hours  No results found for this basename: DDIMER,  in the last 72 hours  Cardiac Enzymes No results found for this basename: CK, CKMB, TROPONINI, MYOGLOBIN,  in the last 168 hours ------------------------------------------------------------------------------------------------------------------ No components found with this basename: POCBNP,   Micro Results No results found for this or any previous visit (from the past 240 hour(s)).   Chest x-ray shows left-sided pneumonia, multilobar  Assessment & Plan   Respiratory failure on the basis of advanced COPD on nasal cannula and high flow oxygen  when necessary .  COPD exacerbation continue with nebulizer treatments and steroids Aspiration pneumonia left side on IV Abxs Protein malnutrition tolerating by mouth Generalized weakness continue with PT OT Diabetes mellitus type 2 continue Levemir and NovoLog worse due to steroids GERD continue PPI Hypertension continue with  Cardizem Lt Shoulder Fx; left humerus around 6 weeks ago , seen by Dr. Lajoyce Corners, no restrictions, DC sling, PT/OT Leukocytosis improving  Plan  Continue same treatment Code Status: DNR    DVT Prophylaxis  Xarelto   Carron Curie M.D on 01/26/2014 at 10:31 AM

## 2014-01-29 ENCOUNTER — Other Ambulatory Visit (HOSPITAL_COMMUNITY): Payer: Medicare Other

## 2014-01-29 LAB — BASIC METABOLIC PANEL
Anion gap: 12 (ref 5–15)
BUN: 17 mg/dL (ref 6–23)
CHLORIDE: 102 meq/L (ref 96–112)
CO2: 28 meq/L (ref 19–32)
Calcium: 8.6 mg/dL (ref 8.4–10.5)
Creatinine, Ser: 0.6 mg/dL (ref 0.50–1.10)
GFR calc Af Amer: >90 mL/min (ref >90)
GFR calc non Af Amer: 83 mL/min — ABNORMAL LOW (ref >90)
GLUCOSE: 83 mg/dL (ref 70–99)
Potassium: 4.6 mEq/L (ref 3.7–5.3)
Sodium: 142 mEq/L (ref 137–147)

## 2014-01-29 LAB — CBC
HEMATOCRIT: 34.4 % — AB (ref 36.0–46.0)
HEMOGLOBIN: 11.4 g/dL — AB (ref 12.0–15.0)
MCH: 30.8 pg (ref 26.0–34.0)
MCHC: 33.1 g/dL (ref 30.0–36.0)
MCV: 93 fL (ref 78.0–100.0)
Platelets: 236 10*3/uL (ref 150–400)
RBC: 3.7 MIL/uL — ABNORMAL LOW (ref 3.87–5.11)
RDW: 14.7 % (ref 11.5–15.5)
WBC: 8.6 10*3/uL (ref 4.0–10.5)

## 2014-01-31 ENCOUNTER — Telehealth: Payer: Self-pay | Admitting: Internal Medicine

## 2014-01-31 NOTE — Telephone Encounter (Signed)
Marie from hospital called in and stated patient needed a hospital follow up from respiratory failure for sometime soon.

## 2014-02-02 NOTE — Telephone Encounter (Signed)
Appt scheduled 8/4/5, records requested.

## 2014-02-04 ENCOUNTER — Ambulatory Visit (INDEPENDENT_AMBULATORY_CARE_PROVIDER_SITE_OTHER): Payer: Medicare Other | Admitting: Internal Medicine

## 2014-02-04 ENCOUNTER — Encounter: Payer: Self-pay | Admitting: Internal Medicine

## 2014-02-04 VITALS — BP 100/60 | HR 82 | Temp 98.0°F | Ht 62.0 in | Wt 109.0 lb

## 2014-02-04 DIAGNOSIS — Z720 Tobacco use: Secondary | ICD-10-CM

## 2014-02-04 DIAGNOSIS — E119 Type 2 diabetes mellitus without complications: Secondary | ICD-10-CM

## 2014-02-04 DIAGNOSIS — D649 Anemia, unspecified: Secondary | ICD-10-CM

## 2014-02-04 DIAGNOSIS — J449 Chronic obstructive pulmonary disease, unspecified: Secondary | ICD-10-CM

## 2014-02-04 DIAGNOSIS — F17200 Nicotine dependence, unspecified, uncomplicated: Secondary | ICD-10-CM

## 2014-02-04 DIAGNOSIS — I4891 Unspecified atrial fibrillation: Secondary | ICD-10-CM

## 2014-02-04 DIAGNOSIS — K219 Gastro-esophageal reflux disease without esophagitis: Secondary | ICD-10-CM

## 2014-02-04 NOTE — Progress Notes (Signed)
Pre visit review using our clinic review tool, if applicable. No additional management support is needed unless otherwise documented below in the visit note. 

## 2014-02-07 ENCOUNTER — Encounter: Payer: Self-pay | Admitting: Internal Medicine

## 2014-02-07 DIAGNOSIS — Z720 Tobacco use: Secondary | ICD-10-CM | POA: Insufficient documentation

## 2014-02-07 NOTE — Assessment & Plan Note (Signed)
Follow cbc.  

## 2014-02-07 NOTE — Assessment & Plan Note (Signed)
Controlled.  

## 2014-02-07 NOTE — Assessment & Plan Note (Signed)
Discussed smoking cessation.  She desires not to quit.    

## 2014-02-07 NOTE — Assessment & Plan Note (Signed)
Continue follow up with Dr Meredeth IdeFleming.  Has appt next week.  Continue nebs and inhalers.  Cough and congestion better.  Continues on home O2.

## 2014-02-07 NOTE — Assessment & Plan Note (Signed)
Had atrial fib in the hospital.  Converted on Tikosyn.  Is now maintained on sotalol.  Rate controlled.  Keep appt with cardiology on 02/07/14.

## 2014-02-07 NOTE — Assessment & Plan Note (Signed)
Sugars have been well controlled previously.   Will need follow up met b and a1c.  Will hold novolog.  She has not been using.  Continue levemir for now.  Sugars in am averaging 130s.  Follow.

## 2014-02-07 NOTE — Progress Notes (Signed)
Subjective:    Patient ID: Carla Harris, female    DOB: March 08, 1929, 80 y.o.   MRN: [id number]  HPI 80 year old female with past history of COPD/bronchiectasis, anemia and hypercholesterolemia who comes in today for a hospital/rehab follow up.   Was hospitalized 01/03/14 -01/10/14 for COPD exacerbation.   Was given abx and steroids and frequent nebs.  She also has afib.  Heart rate was labile in the hospital.   Maintained on sotalol now.  Was discharged to rehab.  Just came home two days ago.  Cough is better.  Breathing is better.  Uses her nebulizer and her inhalers.  She is back on her previous regimen.   She is eating and drinking.  Uses her oxygen.  Denies any increased heart rate or palpitations since she has been discharged.  No chest pain.  Bowels stable.  Still smoking.  Have again discussed the need to quit.  On cipro.  She is now on levemir.  Not taking novolog.  States sugars in the am are averaging 130s.  Receiving home health and physical therapy.  Overall she feels things are stable.   Due to see cardiology 02/07/14 and Dr Meredeth Ide 02/08/14.      Past Medical History  Diagnosis Date  . Anemia   . Diabetes mellitus without complication   . Bronchiectasis   . Colon polyps   . Hypercholesteremia   . S/P AAA repair   . Right rotator cuff tear   . COPD (chronic obstructive pulmonary disease)   . Low serum IgG1 and IgM levels     Current Outpatient Prescriptions on File Prior to Visit  Medication Sig Dispense Refill  . albuterol (PROVENTIL HFA;VENTOLIN HFA) 108 (90 BASE) MCG/ACT inhaler Inhale 1 puff into the lungs every 6 (six) hours as needed for wheezing or shortness of breath.      Marland Kitchen albuterol-ipratropium (COMBIVENT) 18-103 MCG/ACT inhaler Inhale 2 puffs into the lungs every 6 (six) hours as needed.      Marland Kitchen aspirin EC 81 MG tablet Take 81 mg by mouth daily.      . cetirizine (ZYRTEC) 10 MG tablet Take 10 mg by mouth daily.      . Cholecalciferol (VITAMIN D3) 2000 UNITS capsule Take  2,000 Units by mouth daily.      Marland Kitchen esomeprazole (NEXIUM) 40 MG capsule Take 40 mg by mouth daily before breakfast. Take 1 capsule twice a day      . Fluticasone-Salmeterol (ADVAIR) 250-50 MCG/DOSE AEPB Inhale 1 puff into the lungs every 12 (twelve) hours.      . formoterol (PERFOROMIST) 20 MCG/2ML nebulizer solution Take 20 mcg by nebulization 2 (two) times daily.      . metFORMIN (GLUCOPHAGE) 500 MG tablet Take 1 tablet (500 mg total) by mouth 2 (two) times daily with a meal. Two times a day with meal.  180 tablet  3  . montelukast (SINGULAIR) 10 MG tablet Take 10 mg by mouth at bedtime.      . predniSONE (DELTASONE) 10 MG tablet Take 10 mg by mouth daily with breakfast.      . theophylline (THEODUR) 200 MG 12 hr tablet Take 200 mg by mouth 2 (two) times daily.      Marland Kitchen tiotropium (SPIRIVA) 18 MCG inhalation capsule Place 18 mcg into inhaler and inhale daily.      . pravastatin (PRAVACHOL) 20 MG tablet Take 20 mg by mouth daily.       No current facility-administered medications on file  prior to visit.    Review of Systems Patient denies any headache, lightheadedness or dizziness.  No significant sinus or allergy symptoms.  No chest pain. Using her nebulizers and inhalers.  Cough and congestion better.  Still smoking.   No nausea or vomiting. No abdominal pain or cramping.  No bowel change, such as diarrhea, constipation, BRBPR or melana.  No urine change. No increased heart rate or palpitations now.  Variable heart rate in the hospital.  Overall she feels things are stable.  Has home health and physical therapy.         Objective:   Physical Exam  Filed Vitals:   02/04/14 1455  BP: 100/60  Pulse: 82  Temp: 98 F (7236.527 C)   80 year old female in no acute distress.    HEENT:  Nares - clear.  OP- without lesions or erythema.  NECK:  Supple, nontender.     HEART:  Rate controlled.   LUNGS:   Increased air movement.  No increased congestion.  No increased wheezing.  Some increased cough  with forced expiration.       RADIAL PULSE:  Equal bilaterally.  ABDOMEN:  Soft, nontender.     EXTREMITIES:  Swelling improved - resolved.                      Assessment & Plan:  SMOKING CESSATION.  Have discussed with her today the need to quit.  She desires not to quit at this time.   Has cut down.   INCREASED PSYCHOSOCIAL STRESSORS.  Feels she is handling things relatively well.  Follow.  Takes xanax prn.   HISTORY OF AAA REPAIR.  Last ultrasound 2010.  Dr Maye HidesMcCann reviewed.  He felt no further w/up for several years - then follow up with ultrasound.  (phone Dr Maye HidesMcCann - [phone]    HEALTH MAINTENANCE.  Physical 12/05/11.  She declined GU and rectal exam.  Declined GI evaluation.  Will notify me when agreeable for bone density.  Mammogram 04/17/11 - BiRads II.  Will notify me when agreeable for f/u mammogram.    I spent 25 minutes with the patient and more than 50% of the time was spent in consultation regarding the above.

## 2014-02-09 DIAGNOSIS — E119 Type 2 diabetes mellitus without complications: Secondary | ICD-10-CM

## 2014-02-09 DIAGNOSIS — J449 Chronic obstructive pulmonary disease, unspecified: Secondary | ICD-10-CM

## 2014-02-09 DIAGNOSIS — J690 Pneumonitis due to inhalation of food and vomit: Secondary | ICD-10-CM

## 2014-02-09 DIAGNOSIS — S42309D Unspecified fracture of shaft of humerus, unspecified arm, subsequent encounter for fracture with routine healing: Secondary | ICD-10-CM

## 2014-02-15 ENCOUNTER — Ambulatory Visit: Payer: Medicare Other | Admitting: Internal Medicine

## 2014-02-21 ENCOUNTER — Ambulatory Visit: Payer: Medicare Other | Admitting: Internal Medicine

## 2014-03-02 ENCOUNTER — Telehealth: Payer: Self-pay | Admitting: *Deleted

## 2014-03-02 NOTE — Telephone Encounter (Signed)
Wilkie AyeKristy, nurse with [facility] called. Pt complaining of left side low back pain, this is the second week. Wilkie AyeKristy concerned it may be UTI, requesting UA and culture if ok? Please advise in Dr. Roby LoftsScott's absence, thanks!

## 2014-03-02 NOTE — Telephone Encounter (Signed)
[name] notified

## 2014-03-02 NOTE — Telephone Encounter (Signed)
Fine to get UA and culture. They should fax results of UA to us

## 2014-03-07 ENCOUNTER — Telehealth: Payer: Self-pay | Admitting: Internal Medicine

## 2014-03-07 NOTE — Telephone Encounter (Signed)
Notify pt that I received urinalysis and urine culture results.  Urine culture did not reveal one distinct bacteria.  If no urinary symptoms - ok.  If symptoms, will need to repeat u/a and culture.

## 2014-03-08 NOTE — Telephone Encounter (Signed)
Spoke with patient & she will check sugars BID some fasting & some 2 hours after meal or before bed. Pt will send in readings over the next week. She was also notified of her culture results & states that she has been "going" more than usual. But doesn't feel that she needs any further testing at this time. I also left a detailed voicemail with Neysa Bonito with the same information. Pt also reports that she has been on Prednisone  daily since she was d/c from the hospital.

## 2014-03-08 NOTE — Telephone Encounter (Signed)
Noted  

## 2014-03-08 NOTE — Telephone Encounter (Signed)
The 500 seems to be an outlier from the other two readings.  Can they send [place] a record of her sugar readings recently - more readings.  Also, check blood sugar at least bid (fasting and either 2 hours after supper or before bed) and send them to [place] over the next week.  Also send previous readings.  Please confirm if pt is on any medication for her sugars and if she is currently taking prednisone.

## 2014-03-08 NOTE — Telephone Encounter (Signed)
Received a call from [facility] at [employer] called to report the she saw Carla Harris this morning & her BS have been all over the place. It was over 500 on Sunday, over 200 yesterday & 153 this morning. Please advise

## 2014-03-09 ENCOUNTER — Telehealth: Payer: Self-pay | Admitting: Internal Medicine

## 2014-03-09 NOTE — Telephone Encounter (Signed)
I can see her on 03/23/14 at 12:30.  Pharmacy or pt can let us know what meds need refilled.  We can go ahead and refill.  Regarding insulin - per yesterday note - she is to be checking her sugar and calling in to [place].  Will need to adjust medication.  How is she taking it now.

## 2014-03-09 NOTE — Telephone Encounter (Signed)
Please advise 

## 2014-03-09 NOTE — Telephone Encounter (Signed)
Carla Harris from Dr. Verdie Shire office called and stated patient she has an appt with Dr.[name] on October 7th but was wondering if she could get an appt sooner. Carla Harris stated she is out of refills for all her daily medication and stated she is confused on how to take her insulin as well. Please advise.

## 2014-03-10 ENCOUNTER — Other Ambulatory Visit: Payer: Self-pay | Admitting: *Deleted

## 2014-03-10 MED ORDER — GLUCOSE BLOOD VI STRP: ORAL_STRIP | Status: DC

## 2014-03-10 NOTE — Telephone Encounter (Signed)
Pt notified of new appt date & time. She is currently taking 8 units of insulin once a day @ 9am.  Her readings from yesterday were: 8:00-153 11:30-210 12:30-250  Today's readings are: 10:00-236 12:00-210

## 2014-03-11 NOTE — Telephone Encounter (Signed)
Continue current insulin dose for now.  Continue to monitor sugars.  If she can get some pm readings - this would be helpful.  Per previous message, needed refills.  Need to know what meds need refilling.

## 2014-03-11 NOTE — Telephone Encounter (Signed)
Pt notified & will call in more pm readings. She states that the only thing she knows that needed a refill was the test strips. I refilled them yesterday & notified to have pharmacy contact us if she needs any other medications refilled.

## 2014-03-15 ENCOUNTER — Encounter: Payer: Self-pay | Admitting: Internal Medicine

## 2014-03-23 ENCOUNTER — Ambulatory Visit (INDEPENDENT_AMBULATORY_CARE_PROVIDER_SITE_OTHER): Payer: Medicare Other | Admitting: Internal Medicine

## 2014-03-23 ENCOUNTER — Telehealth: Payer: Self-pay | Admitting: *Deleted

## 2014-03-23 ENCOUNTER — Encounter: Payer: Self-pay | Admitting: Internal Medicine

## 2014-03-23 VITALS — BP 110/70 | HR 61 | Temp 97.9°F | Ht 62.0 in | Wt 104.5 lb

## 2014-03-23 DIAGNOSIS — F17200 Nicotine dependence, unspecified, uncomplicated: Secondary | ICD-10-CM

## 2014-03-23 DIAGNOSIS — I4891 Unspecified atrial fibrillation: Secondary | ICD-10-CM

## 2014-03-23 DIAGNOSIS — E7800 Pure hypercholesterolemia, unspecified: Secondary | ICD-10-CM

## 2014-03-23 DIAGNOSIS — Z720 Tobacco use: Secondary | ICD-10-CM

## 2014-03-23 DIAGNOSIS — K219 Gastro-esophageal reflux disease without esophagitis: Secondary | ICD-10-CM

## 2014-03-23 DIAGNOSIS — E119 Type 2 diabetes mellitus without complications: Secondary | ICD-10-CM

## 2014-03-23 DIAGNOSIS — J4489 Other specified chronic obstructive pulmonary disease: Secondary | ICD-10-CM

## 2014-03-23 DIAGNOSIS — J449 Chronic obstructive pulmonary disease, unspecified: Secondary | ICD-10-CM

## 2014-03-23 DIAGNOSIS — D649 Anemia, unspecified: Secondary | ICD-10-CM

## 2014-03-23 DIAGNOSIS — J471 Bronchiectasis with (acute) exacerbation: Secondary | ICD-10-CM

## 2014-03-23 NOTE — Telephone Encounter (Signed)
FYI: Pt called to state that she is scheduled to see Dr. Welton Flakes (Pulminology) on Monday.

## 2014-03-23 NOTE — Progress Notes (Signed)
Pre visit review using our clinic review tool, if applicable. No additional management support is needed unless otherwise documented below in the visit note. 

## 2014-03-23 NOTE — Patient Instructions (Signed)
Change lantus to 10 units in the am

## 2014-03-27 ENCOUNTER — Encounter: Payer: Self-pay | Admitting: Internal Medicine

## 2014-03-27 NOTE — Assessment & Plan Note (Signed)
Sugars have been well controlled previously.   Will need follow up met b and a1c.  Will hold novolog.  She has not been using.  Continue levemir for now.  Increase to 10 units q am. Sugars elevated as outlined.  Discussed diet and exercise.  Follow.

## 2014-03-27 NOTE — Assessment & Plan Note (Signed)
Low cholesterol diet and exercise.  Follow lipid profile.    

## 2014-03-27 NOTE — Assessment & Plan Note (Signed)
Is followed by Dr Fleming.   Continue O2.  He is following her cxr, etc.  See last note.    

## 2014-03-27 NOTE — Assessment & Plan Note (Signed)
Follow cbc.  

## 2014-03-27 NOTE — Progress Notes (Signed)
Subjective:    Patient ID: Carla Harris, female    DOB: Jul 29, 1928, 80 y.o.   MRN: [id number]  HPI 80 year old female with past history of COPD/bronchiectasis, anemia and hypercholesterolemia who comes in today for a scheduled follow up.  Was hospitalized 01/03/14 -01/10/14 for COPD exacerbation.   Was given abx and steroids and frequent nebs.  She also has afib.  Heart rate was labile in the hospital.   Maintained on sotalol now.  Was discharged to rehab.  Cough is better.  Breathing is better.  Uses her nebulizer and her inhalers.  She is back on her previous regimen.   She is eating and drinking.  Uses her oxygen.  Denies any increased heart rate or palpitations since she has been discharged.  No chest pain.  Bowels stable.  Still smoking.  I again discussed the need to quit.  She desires to continue to quit smoking.  She is now on levemir.  Not taking novolog.  States sugars in the am are averaging 130-140s mostly.  Occasionally will increase to 200 range.  PM sugars averaging mostly 200-275.  Occasionally higher.  She does not watch her diet.  She does have a scalp lesion.  Sore.  Persistent.  No headache.  Does report she has noticed a change in her eyes. No acute vision loss.  No pain.  Plans to get her eyes checked.  Had questions about her cardiac medications.        Past Medical History  Diagnosis Date  . Anemia   . Diabetes mellitus without complication   . Bronchiectasis   . Colon polyps   . Hypercholesteremia   . S/P AAA repair   . Right rotator cuff tear   . COPD (chronic obstructive pulmonary disease)   . Low serum IgG1 and IgM levels     Current Outpatient Prescriptions on File Prior to Visit  Medication Sig Dispense Refill  . albuterol (PROVENTIL HFA;VENTOLIN HFA) 108 (90 BASE) MCG/ACT inhaler Inhale 1 puff into the lungs every 6 (six) hours as needed for wheezing or shortness of breath.      Marland Kitchen albuterol-ipratropium (COMBIVENT) 18-103 MCG/ACT inhaler Inhale 2 puffs into the  lungs every 6 (six) hours as needed.      Marland Kitchen aspirin EC 81 MG tablet Take 81 mg by mouth daily.      . cetirizine (ZYRTEC) 10 MG tablet Take 10 mg by mouth daily.      . Cholecalciferol (VITAMIN D3) 2000 UNITS capsule Take 2,000 Units by mouth daily.      Marland Kitchen diltiazem (CARDIZEM) 120 MG tablet Take 120 mg by mouth daily.      Marland Kitchen esomeprazole (NEXIUM) 40 MG capsule Take 40 mg by mouth daily before breakfast. Take 1 capsule twice a day      . Fluticasone-Salmeterol (ADVAIR) 250-50 MCG/DOSE AEPB Inhale 1 puff into the lungs every 12 (twelve) hours.      . formoterol (PERFOROMIST) 20 MCG/2ML nebulizer solution Take 20 mcg by nebulization 2 (two) times daily.      Marland Kitchen glucose blood test strip Use as instructed to check sugars 2-3 times a day Dx: 250.00  100 each  12  . insulin aspart (NOVOLOG FLEXPEN) 100 UNIT/ML FlexPen Inject into the skin.      Marland Kitchen insulin detemir (LEVEMIR) 100 UNIT/ML injection Inject 8 Units into the skin at bedtime.      . metFORMIN (GLUCOPHAGE) 500 MG tablet Take 1 tablet (500 mg total) by mouth 2 (  two) times daily with a meal. Two times a day with meal.  180 tablet  3  . montelukast (SINGULAIR) 10 MG tablet Take 10 mg by mouth at bedtime.      . Multiple Vitamin (MULTIVITAMIN) tablet Take 1 tablet by mouth daily.      . pravastatin (PRAVACHOL) 20 MG tablet Take 20 mg by mouth daily.      . sotalol (BETAPACE) 80 MG tablet Take 80 mg by mouth 2 (two) times daily.      . theophylline (THEODUR) 200 MG 12 hr tablet Take 200 mg by mouth 2 (two) times daily.      Marland Kitchen tiotropium (SPIRIVA) 18 MCG inhalation capsule Place 18 mcg into inhaler and inhale daily.       No current facility-administered medications on file prior to visit.    Review of Systems Patient denies any headache, lightheadedness or dizziness.  No significant sinus or allergy symptoms.  No chest pain. Using her nebulizers and inhalers.  Cough and congestion better. Breathing better.   Still smoking.   No nausea or vomiting.  No abdominal pain or cramping.  No bowel change, such as diarrhea, constipation, BRBPR or melana.  No urine change. No increased heart rate or palpitations now.  Variable heart rate in the hospital.  Sugars elevated as outlined.         Objective:   Physical Exam  Filed Vitals:   03/23/14 1308  BP: 110/70  Pulse: 61  Temp: 97.9 F (36.6 C)   Blood pressure rechecked:  118/60 standing.  Not orthostatic on exam.   80 year old female in no acute distress.    HEENT:  Nares - clear.  OP- without lesions or erythema.  NECK:  Supple, nontender.     HEART:  Rate controlled.   LUNGS:   Increased air movement.  No increased congestion.  No increased wheezing.  Some increased cough with forced expiration.       RADIAL PULSE:  Equal bilaterally.  ABDOMEN:  Soft, nontender.     EXTREMITIES:  Swelling improved - resolved.                      Assessment & Plan:  SMOKING CESSATION.  Have discussed with her today the need to quit.  She desires not to quit at this time.   Has cut down.   INCREASED PSYCHOSOCIAL STRESSORS.  Feels she is handling things relatively well.  Follow.  Takes xanax prn.   HISTORY OF AAA REPAIR.  Last ultrasound 2010.  Dr Maye Hides reviewed.  He felt no further w/up for several years - then follow up with ultrasound.  (phone Dr Maye Hides - [phone]).    HEALTH MAINTENANCE.  Physical 12/05/11.  She declined GU and rectal exam.  Declined GI evaluation.  Will notify me when agreeable for bone density.  Mammogram 04/17/11 - BiRads II.  Will notify me when agreeable for f/u mammogram.    I spent 25 minutes with the patient and more than 50% of the time was spent in consultation regarding the above.

## 2014-03-27 NOTE — Assessment & Plan Note (Signed)
Continue follow up with Dr Meredeth Ide.  Continue nebs and inhalers.  Cough and congestion better.  Continues on home O2.  Breathing better.

## 2014-03-27 NOTE — Assessment & Plan Note (Signed)
Had atrial fib in the hospital.  Converted on Tikosyn.  Is now maintained on sotalol.  Rate controlled.  Had questions about her cardiac medications.  Is going to call to make appt with cardiology to discuss.

## 2014-03-27 NOTE — Assessment & Plan Note (Signed)
Controlled.  

## 2014-03-27 NOTE — Assessment & Plan Note (Signed)
Discussed smoking cessation.  She desires not to quit.    

## 2014-04-07 ENCOUNTER — Emergency Department: Payer: Self-pay | Admitting: Emergency Medicine

## 2014-04-07 LAB — COMPREHENSIVE METABOLIC PANEL
ALT: 11 U/L — AB
Albumin: 3.1 g/dL — ABNORMAL LOW (ref 3.4–5.0)
Alkaline Phosphatase: 65 U/L
Anion Gap: 6 — ABNORMAL LOW (ref 7–16)
BUN: 13 mg/dL (ref 7–18)
Bilirubin,Total: 0.5 mg/dL (ref 0.2–1.0)
CHLORIDE: 104 mmol/L (ref 98–107)
Calcium, Total: 8.7 mg/dL (ref 8.5–10.1)
Co2: 28 mmol/L (ref 21–32)
Creatinine: 0.77 mg/dL (ref 0.60–1.30)
Glucose: 196 mg/dL — ABNORMAL HIGH (ref 65–99)
Osmolality: 281 (ref 275–301)
POTASSIUM: 4.3 mmol/L (ref 3.5–5.1)
SGOT(AST): 18 U/L (ref 15–37)
Sodium: 138 mmol/L (ref 136–145)
TOTAL PROTEIN: 6.5 g/dL (ref 6.4–8.2)

## 2014-04-07 LAB — CBC
HCT: 40.6 % (ref 35.0–47.0)
HGB: 13.6 g/dL (ref 12.0–16.0)
MCH: 30.1 pg (ref 26.0–34.0)
MCHC: 33.5 g/dL (ref 32.0–36.0)
MCV: 90 fL (ref 80–100)
Platelet: 232 10*3/uL (ref 150–440)
RBC: 4.52 10*6/uL (ref 3.80–5.20)
RDW: 14.8 % — AB (ref 11.5–14.5)
WBC: 15.3 10*3/uL — AB (ref 3.6–11.0)

## 2014-04-07 LAB — TROPONIN I: Troponin-I: <0.02 ng/mL

## 2014-04-14 ENCOUNTER — Telehealth: Payer: Self-pay | Admitting: *Deleted

## 2014-04-14 ENCOUNTER — Telehealth: Payer: Self-pay | Admitting: Internal Medicine

## 2014-04-14 MED ORDER — INSULIN DETEMIR 100 UNIT/ML ~~LOC~~ SOLN: 8.0000 [IU] | Freq: Every day | SUBCUTANEOUS | Status: DC

## 2014-04-14 NOTE — Telephone Encounter (Signed)
Needs hospital follow up.  Put her on the schedule for 9:30 tomorrow.

## 2014-04-14 NOTE — Telephone Encounter (Signed)
Records requested

## 2014-04-14 NOTE — Telephone Encounter (Signed)
Records placed in green folder 

## 2014-04-14 NOTE — Telephone Encounter (Signed)
Spoke with patient, states she is unable to be seen tomorrow due to the weather, and does not have someone that could drive her. Then spoke with pt's daughter, Carla Harris, stated again pt refuses to go out in the rain tomorrow. STates she saw Dr. Meredeth IdeFleming yesterday and was given abx for UTI, states symptoms have improved. Pt has appt already scheduled 04/20/14. Advised daughter to call back with persistent or worsening symptoms prior to the appointment.

## 2014-04-14 NOTE — Telephone Encounter (Signed)
Received a Rx for Levemir vial because it is such a small amount, insurance will cover it. Pharmacy suggest switching her to the pen because they will cover that.

## 2014-04-14 NOTE — Telephone Encounter (Signed)
pts daughter informed me pt had been to ER recently.  Need recent ER records (within the last week or two).  Also need last couple of notes from Dr Park BreedKahn.  He sees her for her afib, etc.  Need records asap.

## 2014-04-14 NOTE — Telephone Encounter (Signed)
Ok to change to the pen.  Ask them what we need to do. Can give a verbal ok for how they say it will be cheaper to change.

## 2014-04-14 NOTE — Telephone Encounter (Signed)
ED results placed in [name]'s box

## 2014-04-14 NOTE — Telephone Encounter (Signed)
Contacted pharmacy & okayed change

## 2014-04-19 ENCOUNTER — Ambulatory Visit: Payer: Self-pay | Admitting: Specialist

## 2014-04-20 ENCOUNTER — Ambulatory Visit (INDEPENDENT_AMBULATORY_CARE_PROVIDER_SITE_OTHER): Payer: Medicare Other | Admitting: Internal Medicine

## 2014-04-20 ENCOUNTER — Encounter: Payer: Self-pay | Admitting: Internal Medicine

## 2014-04-20 VITALS — BP 110/70 | HR 72 | Temp 97.9°F | Ht 62.0 in | Wt 103.2 lb

## 2014-04-20 DIAGNOSIS — N949 Unspecified condition associated with female genital organs and menstrual cycle: Secondary | ICD-10-CM

## 2014-04-20 DIAGNOSIS — I4891 Unspecified atrial fibrillation: Secondary | ICD-10-CM

## 2014-04-20 DIAGNOSIS — N9489 Other specified conditions associated with female genital organs and menstrual cycle: Secondary | ICD-10-CM

## 2014-04-20 DIAGNOSIS — K219 Gastro-esophageal reflux disease without esophagitis: Secondary | ICD-10-CM

## 2014-04-20 DIAGNOSIS — E119 Type 2 diabetes mellitus without complications: Secondary | ICD-10-CM

## 2014-04-20 DIAGNOSIS — E7800 Pure hypercholesterolemia, unspecified: Secondary | ICD-10-CM

## 2014-04-20 DIAGNOSIS — D649 Anemia, unspecified: Secondary | ICD-10-CM

## 2014-04-20 DIAGNOSIS — J471 Bronchiectasis with (acute) exacerbation: Secondary | ICD-10-CM

## 2014-04-20 DIAGNOSIS — Z720 Tobacco use: Secondary | ICD-10-CM

## 2014-04-20 DIAGNOSIS — R0902 Hypoxemia: Secondary | ICD-10-CM

## 2014-04-20 DIAGNOSIS — J449 Chronic obstructive pulmonary disease, unspecified: Secondary | ICD-10-CM

## 2014-04-20 NOTE — Progress Notes (Signed)
Pre visit review using our clinic review tool, if applicable. No additional management support is needed unless otherwise documented below in the visit note. 

## 2014-04-24 ENCOUNTER — Encounter: Payer: Self-pay | Admitting: Internal Medicine

## 2014-04-24 DIAGNOSIS — N9489 Other specified conditions associated with female genital organs and menstrual cycle: Secondary | ICD-10-CM | POA: Insufficient documentation

## 2014-04-24 DIAGNOSIS — R0902 Hypoxemia: Secondary | ICD-10-CM | POA: Insufficient documentation

## 2014-04-24 NOTE — Assessment & Plan Note (Signed)
Low cholesterol diet and exercise.  Follow lipid profile.    

## 2014-04-24 NOTE — Assessment & Plan Note (Signed)
Controlled.  

## 2014-04-24 NOTE — Assessment & Plan Note (Signed)
Is followed by Dr Fleming.   Continue O2.  He is following her cxr, etc.  See last note.    

## 2014-04-24 NOTE — Assessment & Plan Note (Signed)
On oxygen.  Follow.

## 2014-04-24 NOTE — Assessment & Plan Note (Signed)
Not taking her levemir because she ran out.  Did not check with her pharmacy for refill.  Instructed to restart.  Follow sugars.

## 2014-04-24 NOTE — Assessment & Plan Note (Signed)
Is followed by Dr Meredeth IdeFleming.   Continue O2.  He is following her cxr, etc.  See last note.

## 2014-04-24 NOTE — Progress Notes (Signed)
Subjective:    Patient ID: Carla Harris, female    DOB: 1928/12/18, 80 y.o.   MRN: [id number]  HPI 80 year old female with past history of COPD/bronchiectasis, anemia and hypercholesterolemia who comes in today for a scheduled follow up.  Was hospitalized 01/03/14 -01/10/14 for COPD exacerbation.   Was given abx and steroids and frequent nebs.  She also has afib.  Heart rate was labile in the hospital.   Maintained on sotalol now.  Was discharged to rehab.  Cough is better.  Breathing is better.  Uses her nebulizer and her inhalers.  She is back on her previous regimen.   She is eating and drinking.  Uses her oxygen.  Denies any increased heart rate or palpitations since she has been discharged.  No chest pain.  Bowels stable.  Still smoking.  I again discussed the need to quit.  She desires to continue to quit smoking.  She is off levemir.  Has not checked with pharmacy for her prescription.  Brought in no recorded sugar readings.  Are elevated if she eats sweets.   No headache.  She went to the ER on 04/07/14 for back pain.  CT scan revealed distention of the right colon with stool.  No bowel obstruction.  2.7x2.7 right adnexal region cystic mass c/w ovarian cyst or cystadenoma and enlarged liver.  Cholelithiasis.  Mild distal abdominal aortic aneurysm.  Patchy bilateral lower lobe scarring and bronchiectatic change.  She subsequently saw Dr Meredeth Ide.  Treated for uti.  On abx.  Back pain better.  Almost resolved.  Feels better.  She desires no further testing for the adnexal mass.  She declines further testing or scanning.  States she is tired.  No suicidal ideations.         Past Medical History  Diagnosis Date  . Anemia   . Diabetes mellitus without complication   . Bronchiectasis   . Colon polyps   . Hypercholesteremia   . S/P AAA repair   . Right rotator cuff tear   . COPD (chronic obstructive pulmonary disease)   . Low serum IgG1 and IgM levels     Current Outpatient Prescriptions on File  Prior to Visit  Medication Sig Dispense Refill  . acetylcysteine (MUCOMYST) 20 % nebulizer solution Take 2 mLs by nebulization 2 (two) times daily.      Marland Kitchen albuterol (PROVENTIL HFA;VENTOLIN HFA) 108 (90 BASE) MCG/ACT inhaler Inhale 1 puff into the lungs every 6 (six) hours as needed for wheezing or shortness of breath.      Marland Kitchen albuterol-ipratropium (COMBIVENT) 18-103 MCG/ACT inhaler Inhale 2 puffs into the lungs every 6 (six) hours as needed.      Marland Kitchen aspirin EC 81 MG tablet Take 81 mg by mouth daily.      . cetirizine (ZYRTEC) 10 MG tablet Take 10 mg by mouth daily.      . Cholecalciferol (VITAMIN D3) 2000 UNITS capsule Take 2,000 Units by mouth daily.      Marland Kitchen diltiazem (CARDIZEM) 120 MG tablet Take 120 mg by mouth daily.      Marland Kitchen esomeprazole (NEXIUM) 40 MG capsule Take 40 mg by mouth daily before breakfast. Take 1 capsule twice a day      . Fluticasone-Salmeterol (ADVAIR) 250-50 MCG/DOSE AEPB Inhale 1 puff into the lungs every 12 (twelve) hours.      . formoterol (PERFOROMIST) 20 MCG/2ML nebulizer solution Take 20 mcg by nebulization 2 (two) times daily.      Marland Kitchen glucose blood  test strip Use as instructed to check sugars 2-3 times a day Dx: 250.00  100 each  12  . insulin aspart (NOVOLOG FLEXPEN) 100 UNIT/ML FlexPen Inject into the skin.      Marland Kitchen. insulin detemir (LEVEMIR) 100 UNIT/ML injection Inject 0.08 mLs (8 Units total) into the skin at bedtime.  10 mL  1  . metFORMIN (GLUCOPHAGE) 500 MG tablet Take 1 tablet (500 mg total) by mouth 2 (two) times daily with a meal. Two times a day with meal.  180 tablet  3  . montelukast (SINGULAIR) 10 MG tablet Take 10 mg by mouth at bedtime.      . Multiple Vitamin (MULTIVITAMIN) tablet Take 1 tablet by mouth daily.      . pravastatin (PRAVACHOL) 20 MG tablet Take 20 mg by mouth daily.      . predniSONE (DELTASONE) 20 MG tablet Take 20 mg by mouth 2 (two) times daily with a meal.      . sotalol (BETAPACE) 80 MG tablet Take 80 mg by mouth 2 (two) times daily.       . theophylline (THEODUR) 200 MG 12 hr tablet Take 200 mg by mouth 2 (two) times daily.      Marland Kitchen. tiotropium (SPIRIVA) 18 MCG inhalation capsule Place 18 mcg into inhaler and inhale daily.       No current facility-administered medications on file prior to visit.    Review of Systems Patient denies any headache, lightheadedness or dizziness.  No significant sinus or allergy symptoms.  No chest pain. Using her nebulizers and inhalers.  Cough and congestion better. Breathing better.   Still smoking.   No nausea or vomiting. No abdominal pain or cramping.  No bowel change, such as diarrhea, constipation, BRBPR or melana.  No urine change. No increased heart rate or palpitations now.  No recorded sugar readings brought in.  Elevated if eats sweets.  Of levemir.      Objective:   Physical Exam  Filed Vitals:   04/20/14 0915  BP: 110/70  Pulse: 72  Temp: 97.9 F (3436.276 C)   80 year old female in no acute distress.    HEENT:  Nares - clear.  OP- without lesions or erythema.  NECK:  Supple, nontender.     HEART:  Rate controlled.   LUNGS:   Increased air movement.  No increased congestion.  No increased wheezing.  Some wheezing with forced expiration.  Some increased cough with forced expiration.       RADIAL PULSE:  Equal bilaterally.  ABDOMEN:  Soft, nontender.     EXTREMITIES:  Swelling improved - resolved.                      Assessment & Plan:  SMOKING CESSATION.  Have discussed with her today the need to quit.  She desires not to quit at this time.   Has cut down.   INCREASED PSYCHOSOCIAL STRESSORS.  Feels she is handling things relatively well.  Follow.  Takes xanax prn.   HISTORY OF AAA REPAIR.  Last ultrasound 2010.  Dr Maye HidesMcCann reviewed.  He felt no further w/up for several years - then follow up with ultrasound.  (phone Dr Maye HidesMcCann - [phone]).    HEALTH MAINTENANCE.  Physical 12/05/11.  She declined GU and rectal exam.  Declined GI evaluation.  Will notify me when agreeable for  bone density.  Mammogram 04/17/11 - BiRads II.  Will notify me when/if agreeable for f/u mammogram.  Pt declines further testing or scanning at this time.    I spent 25 minutes with the patient and more than 50% of the time was spent in consultation regarding the above.

## 2014-04-24 NOTE — Assessment & Plan Note (Signed)
Discussed smoking cessation.  She desires not to quit.

## 2014-04-24 NOTE — Assessment & Plan Note (Signed)
Had atrial fib in the hospital.  Converted on Tikosyn.  Is now maintained on sotalol.  Rate controlled.  Continue f/u with cardiology.

## 2014-04-24 NOTE — Assessment & Plan Note (Signed)
Adnexal mass noted on recent CT.  She declines further w/up.  Discussed with her today.

## 2014-04-24 NOTE — Assessment & Plan Note (Signed)
Follow cbc.  

## 2014-04-25 ENCOUNTER — Other Ambulatory Visit: Payer: Self-pay | Admitting: *Deleted

## 2014-04-25 MED ORDER — METFORMIN HCL 500 MG PO TABS: 500.0000 mg | ORAL_TABLET | Freq: Two times a day (BID) | ORAL | Status: DC

## 2014-06-24 ENCOUNTER — Telehealth: Payer: Self-pay | Admitting: Internal Medicine

## 2014-06-24 NOTE — Telephone Encounter (Signed)
Spoke with pt, advised of MDs message.  Pt refused evaluation.  She states she knows not to eat increased sweets, states she will continue to check her blood sugar and if it does not come down she will go to the ER.

## 2014-06-24 NOTE — Telephone Encounter (Signed)
Carla Harris from [facility] called in regards to Carla Harris. She was seen today and her Blood Sugar Level was 451. She's not been taking her Levemir for a week. Per Carla Harris, she had four chocolate chip cookies for lunch and four white donuts for breakfast this morning. During her visit, she was given insulin. Carla will now being seeing her once every month and wanted to make you aware of her findings today. Please call Carla if you have any questions or concerns.  Carla SalmonsRose Harris ph# [phone] Thank you.

## 2014-06-24 NOTE — Telephone Encounter (Signed)
Need to know how pt is dong.  If sugar this high, then needs evaluation.  Stay hydrated.  Needs to be taking her insulin and avoiding increased sweets.

## 2014-06-25 NOTE — Telephone Encounter (Signed)
Please call pt and confirm with pt that she is still doing ok and that sugars are doing better.

## 2014-06-27 NOTE — Telephone Encounter (Signed)
Spoke with pt, she states she is doing better.

## 2014-07-01 ENCOUNTER — Telehealth: Payer: Self-pay

## 2014-07-01 ENCOUNTER — Encounter: Payer: Self-pay | Admitting: Internal Medicine

## 2014-07-01 ENCOUNTER — Ambulatory Visit (INDEPENDENT_AMBULATORY_CARE_PROVIDER_SITE_OTHER): Payer: Medicare Other | Admitting: Internal Medicine

## 2014-07-01 VITALS — BP 120/64 | HR 56 | Temp 97.8°F | Ht 62.0 in | Wt 104.5 lb

## 2014-07-01 DIAGNOSIS — I4891 Unspecified atrial fibrillation: Secondary | ICD-10-CM

## 2014-07-01 DIAGNOSIS — R5383 Other fatigue: Secondary | ICD-10-CM | POA: Insufficient documentation

## 2014-07-01 DIAGNOSIS — E7800 Pure hypercholesterolemia, unspecified: Secondary | ICD-10-CM

## 2014-07-01 DIAGNOSIS — Z720 Tobacco use: Secondary | ICD-10-CM

## 2014-07-01 DIAGNOSIS — K219 Gastro-esophageal reflux disease without esophagitis: Secondary | ICD-10-CM

## 2014-07-01 DIAGNOSIS — D649 Anemia, unspecified: Secondary | ICD-10-CM

## 2014-07-01 DIAGNOSIS — N949 Unspecified condition associated with female genital organs and menstrual cycle: Secondary | ICD-10-CM

## 2014-07-01 DIAGNOSIS — R0902 Hypoxemia: Secondary | ICD-10-CM

## 2014-07-01 DIAGNOSIS — R42 Dizziness and giddiness: Secondary | ICD-10-CM | POA: Insufficient documentation

## 2014-07-01 DIAGNOSIS — N9489 Other specified conditions associated with female genital organs and menstrual cycle: Secondary | ICD-10-CM

## 2014-07-01 DIAGNOSIS — J449 Chronic obstructive pulmonary disease, unspecified: Secondary | ICD-10-CM

## 2014-07-01 DIAGNOSIS — E119 Type 2 diabetes mellitus without complications: Secondary | ICD-10-CM

## 2014-07-01 LAB — CBC WITH DIFFERENTIAL/PLATELET
BASOS PCT: 0.1 % (ref 0.0–3.0)
Basophils Absolute: 0 10*3/uL (ref 0.0–0.1)
EOS PCT: 0.2 % (ref 0.0–5.0)
Eosinophils Absolute: 0 10*3/uL (ref 0.0–0.7)
HCT: 37.9 % (ref 36.0–46.0)
Hemoglobin: 12.7 g/dL (ref 12.0–15.0)
Lymphs Abs: 1.1 10*3/uL (ref 0.7–4.0)
MCHC: 33.5 g/dL (ref 30.0–36.0)
MCV: 90.5 fl (ref 78.0–100.0)
Monocytes Absolute: 0.5 10*3/uL (ref 0.1–1.0)
Monocytes Relative: 2.4 % — ABNORMAL LOW (ref 3.0–12.0)
Neutro Abs: 20.1 10*3/uL — ABNORMAL HIGH (ref 1.4–7.7)
Neutrophils Relative %: 92.5 % — ABNORMAL HIGH (ref 43.0–77.0)
Platelets: 298 10*3/uL (ref 150.0–400.0)
RBC: 4.19 Mil/uL (ref 3.87–5.11)
RDW: 15.8 % — ABNORMAL HIGH (ref 11.5–15.5)

## 2014-07-01 LAB — COMPREHENSIVE METABOLIC PANEL
ALBUMIN: 3.4 g/dL — AB (ref 3.5–5.2)
ALT: 11 U/L (ref 0–35)
AST: 9 U/L (ref 0–37)
Alkaline Phosphatase: 45 U/L (ref 39–117)
BUN: 18 mg/dL (ref 6–23)
CALCIUM: 8.9 mg/dL (ref 8.4–10.5)
CHLORIDE: 103 meq/L (ref 96–112)
CO2: 25 mEq/L (ref 19–32)
Creatinine, Ser: 0.6 mg/dL (ref 0.4–1.2)
GFR: 94.15 mL/min (ref >60.00)
GLUCOSE: 183 mg/dL — AB (ref 70–99)
Potassium: 4.7 mEq/L (ref 3.5–5.1)
Sodium: 136 mEq/L (ref 135–145)
Total Bilirubin: 0.6 mg/dL (ref 0.2–1.2)
Total Protein: 6.1 g/dL (ref 6.0–8.3)

## 2014-07-01 LAB — LIPID PANEL
CHOL/HDL RATIO: 6
Cholesterol: 249 mg/dL — ABNORMAL HIGH (ref 0–200)
HDL: 43.8 mg/dL (ref >39.00)
LDL Cholesterol: 169 mg/dL — ABNORMAL HIGH (ref 0–99)
NonHDL: 205.2
TRIGLYCERIDES: 182 mg/dL — AB (ref 0.0–149.0)
VLDL: 36.4 mg/dL (ref 0.0–40.0)

## 2014-07-01 NOTE — Telephone Encounter (Signed)
Critical High lab result: White Blood Count= 21.7. Please advise

## 2014-07-01 NOTE — Progress Notes (Signed)
Pre visit review using our clinic review tool, if applicable. No additional management support is needed unless otherwise documented below in the visit note. 

## 2014-07-01 NOTE — Progress Notes (Signed)
Subjective:    Patient ID: Carla Harris, female    DOB: 07/24/1928, 80 y.o.   MRN: [id number]  HPI 80 year old female with past history of COPD/bronchiectasis, anemia and hypercholesterolemia who comes in today for a scheduled follow up.  Was hospitalized 01/03/14 -01/10/14 for COPD exacerbation.   Was given abx and steroids and frequent nebs.  She also has afib.  Heart rate was labile in the hospital.   Maintained on sotalol now.  Was discharged to rehab.  Has been home now for a while.  Has intermittent flares with cough and congestion.  Recent flare.  Spoke to Dr Meredeth IdeFleming.  Has been on increased prednisone and zpak recently.  Uses her nebulizer and her inhalers regularly.  Is maintained on prednisone 20mg  q day.   She is eating and drinking.  Uses her oxygen.  Still smoking.  Have discussed the need to quit.  Discussed again today.   Denies any increased heart rate or palpitations since she has been discharged.  No chest pain.  She had not been taking levemir.  Eating a lot of cookies.  Sugars increased.  back on now.  Sugars starting to improve.  See attached list.   She does report feeling light headed (dizzy).  Some staggering when she walks.  Was questioning if her medication could be contributing.   No headache.  No increased sinus symptoms.  She went to the ER on 04/07/14 for back pain.  CT scan revealed distention of the right colon with stool.  No bowel obstruction.  2.7x2.7 right adnexal region cystic mass c/w ovarian cyst or cystadenoma and enlarged liver.  Cholelithiasis.  Mild distal abdominal aortic aneurysm.  Patchy bilateral lower lobe scarring and bronchiectatic change.  She desires no further testing for the adnexal mass.  She declines further testing or scanning.        Past Medical History  Diagnosis Date  . Anemia   . Diabetes mellitus without complication   . Bronchiectasis   . Colon polyps   . Hypercholesteremia   . S/P AAA repair   . Right rotator cuff tear   . COPD (chronic  obstructive pulmonary disease)   . Low serum IgG1 and IgM levels     Current Outpatient Prescriptions on File Prior to Visit  Medication Sig Dispense Refill  . acetylcysteine (MUCOMYST) 20 % nebulizer solution Take 2 mLs by nebulization 2 (two) times daily.    Marland Kitchen. albuterol (PROVENTIL HFA;VENTOLIN HFA) 108 (90 BASE) MCG/ACT inhaler Inhale 1 puff into the lungs every 6 (six) hours as needed for wheezing or shortness of breath.    Marland Kitchen. albuterol-ipratropium (COMBIVENT) 18-103 MCG/ACT inhaler Inhale 2 puffs into the lungs every 6 (six) hours as needed.    Marland Kitchen. aspirin EC 81 MG tablet Take 81 mg by mouth daily.    . cetirizine (ZYRTEC) 10 MG tablet Take 10 mg by mouth daily.    . Cholecalciferol (VITAMIN D3) 2000 UNITS capsule Take 2,000 Units by mouth daily.    Marland Kitchen. diltiazem (CARDIZEM) 120 MG tablet Take 120 mg by mouth daily.    Marland Kitchen. esomeprazole (NEXIUM) 40 MG capsule Take 40 mg by mouth daily before breakfast. Take 1 capsule twice a day    . Fluticasone-Salmeterol (ADVAIR) 250-50 MCG/DOSE AEPB Inhale 1 puff into the lungs every 12 (twelve) hours.    . formoterol (PERFOROMIST) 20 MCG/2ML nebulizer solution Take 20 mcg by nebulization 2 (two) times daily.    Marland Kitchen. glucose blood test strip Use as  instructed to check sugars 2-3 times a day Dx: 250.00 100 each 12  . insulin aspart (NOVOLOG FLEXPEN) 100 UNIT/ML FlexPen Inject into the skin.    Marland Kitchen. insulin detemir (LEVEMIR) 100 UNIT/ML injection Inject 0.08 mLs (8 Units total) into the skin at bedtime. 10 mL 1  . metFORMIN (GLUCOPHAGE) 500 MG tablet Take 1 tablet (500 mg total) by mouth 2 (two) times daily with a meal. Two times a day with meal. 180 tablet 3  . montelukast (SINGULAIR) 10 MG tablet Take 10 mg by mouth at bedtime.    . Multiple Vitamin (MULTIVITAMIN) tablet Take 1 tablet by mouth daily.    . pravastatin (PRAVACHOL) 20 MG tablet Take 20 mg by mouth daily.    . predniSONE (DELTASONE) 20 MG tablet Take 20 mg by mouth 2 (two) times daily with a meal.    .  sotalol (BETAPACE) 80 MG tablet Take 80 mg by mouth 2 (two) times daily.    . theophylline (THEODUR) 200 MG 12 hr tablet Take 200 mg by mouth 2 (two) times daily.    Marland Kitchen. tiotropium (SPIRIVA) 18 MCG inhalation capsule Place 18 mcg into inhaler and inhale daily.     No current facility-administered medications on file prior to visit.    Review of Systems Patient denies any headache.  Does report light headedness as outlined.   No significant sinus or allergy symptoms.  No chest pain.  Using her nebulizers and inhalers.  Cough and congestion as outlined.  Recently treated.  Finishing zpak.  Still smoking.   No nausea or vomiting. No abdominal pain or cramping.  No bowel change, such as diarrhea, constipation, BRBPR or melana.  No urine change. No increased heart rate or palpitations now.  Sugars attached.      Objective:   Physical Exam  Filed Vitals:   07/01/14 0947  BP: 120/64  Pulse: 56  Temp: 97.8 F (36.6 C)   Blood pressure recheck:  8108/364  80 year old female in no acute distress.    HEENT:  Nares - clear.  OP- without lesions or erythema.  NECK:  Supple, nontender.     HEART:  Rate controlled.   LUNGS:   Increased air movement.  No increased congestion.  No increased wheezing.  Some wheezing with forced expiration. Some increased cough with forced expiration.       RADIAL PULSE:  Equal bilaterally.  ABDOMEN:  Soft, nontender.     EXTREMITIES:  Some pedal and ankle edema.  No swelling extending up the leg.  No increased erythema.                     Assessment & Plan:  1. Hypercholesterolemia Low cholesterol diet. - Lipid panel Lab Results  Component Value Date   CHOL 218* 12/23/2012   HDL 51.10 12/23/2012   LDLDIRECT 139.1 12/23/2012   TRIG 135.0 12/23/2012   CHOLHDL 4 12/23/2012   2. Atrial fibrillation, unspecified Rate controlled.  She was questioning if some of her medications could be contributing to her light headedness.  D/w Dr Park BreedKahn.  No orthostatic on exam.   Heart rate controlled.    3. Type 2 diabetes mellitus without complication Not taking her insulin recently.  Eating increased sweets.  She is back on her long acting insulin now.  Watching her diet.  Sugars starting to improve.  See attached list.  Follow.   Lab Results  Component Value Date   HGBA1C 6.9* 12/23/2012   4.  Dizziness Describes the dizziness/light headedness as outlined.  Not orthostatic on exam.  Was questioning her medications.  No increased sinus congestion.  Does have increased cough and congestion.  Known vascular disease.  Discussed further w/up.  She desires no further scans or testing.  Needs to discuss with Dr Park Breed as well.  Check labs as outlined.    5. Other fatigue Probably multifactorial.   - CBC with Differential - Comprehensive metabolic panel - TSH  6. Chronic obstructive pulmonary disease, unspecified COPD, unspecified chronic bronchitis type Intermittent flares.  On zpak.  Just finished increased prednisone taper.  Continues to f/u with Dr Meredeth Ide.  Has appt next week.    7. Hypoxia On oxygen.    8. Gastroesophageal reflux disease, esophagitis presence not specified Controlled.  9. Anemia, unspecified anemia type Recheck cbc today to confirm hgb stable.    10. Tobacco abuse Discussed again today the need to quit.  She declines to stop.   11. Adnexal mass Declines further w/up.   12. INCREASED PSYCHOSOCIAL STRESSORS.  Feels she is handling things relatively well.  Follow.  Takes xanax prn.   13. HISTORY OF AAA REPAIR.  Last ultrasound 2010.  Dr Maye Hides reviewed.  He felt no further w/up for several years - then follow up with ultrasound.  (phone Dr Maye Hides - [phone]).    HEALTH MAINTENANCE.  Physical 12/05/11.  She declined GU and rectal exam.  Declined GI evaluation.  Will notify me when agreeable for bone density.  Mammogram 04/17/11 - BiRads II.  Will notify me when/if agreeable for f/u mammogram.   Pt declines further testing or scanning at  this time.    I spent 25 minutes with the patient and more than 50% of the time was spent in consultation regarding the above.

## 2014-07-03 ENCOUNTER — Other Ambulatory Visit: Payer: Self-pay | Admitting: Internal Medicine

## 2014-07-03 NOTE — Telephone Encounter (Signed)
Late entry.  I called pt with lab results.  Explained results and discussed my concern regarding the elevation.  We also discussed my concern regarding her breathing.  Recommended to ER for further evaluation and treatment.  She declined.  Will prescribe a zpak as directed.  She has an appt with Dr Meredeth IdeFleming on Tuesday 07/05/14.  Instructed to have cbc rechecked then.  Will be evaluated prior of symptoms change or worsen.    Please notify Dr Reita ClicheFleming's office pt needs a f/u cbc [facility].  Pt desired to have drawn there, since she would be there on Tuesday 07/05/14 for an appt.  (cbc - dx leukocytosis).

## 2014-07-03 NOTE — Progress Notes (Signed)
Opened in error

## 2014-07-04 ENCOUNTER — Other Ambulatory Visit: Payer: Medicare Other

## 2014-07-04 ENCOUNTER — Encounter: Payer: Self-pay | Admitting: Internal Medicine

## 2014-07-04 DIAGNOSIS — E038 Other specified hypothyroidism: Secondary | ICD-10-CM

## 2014-07-04 NOTE — Telephone Encounter (Signed)
Request faxed

## 2014-07-05 ENCOUNTER — Other Ambulatory Visit: Payer: Self-pay | Admitting: *Deleted

## 2014-07-05 LAB — CBC AND DIFFERENTIAL
HEMATOCRIT: 38 % (ref 36–46)
HEMOGLOBIN: 12.4 g/dL (ref 12.0–16.0)
PLATELETS: 298 10*3/uL (ref 150–399)
WBC: 15.7 10^3/mL

## 2014-07-05 LAB — TSH: TSH: 1.586 u[IU]/mL (ref 0.350–4.500)

## 2014-07-05 MED ORDER — PRAVASTATIN SODIUM 40 MG PO TABS: 40.0000 mg | ORAL_TABLET | Freq: Every day | ORAL | Status: DC

## 2014-07-06 ENCOUNTER — Encounter: Payer: Self-pay | Admitting: Internal Medicine

## 2014-07-27 ENCOUNTER — Telehealth: Payer: Self-pay | Admitting: *Deleted

## 2014-07-27 NOTE — Telephone Encounter (Signed)
Leaha, pts daughter, called states she gave Dr Lorin PicketScott a list of medication refills needed for pt.  Vivi MartensLeaha states pt has not received Rx in the mail yet.  Further states the request was for Glucophage and Insulin needles.  They will be going to [place] on Saturday.  Please advise.

## 2014-07-28 ENCOUNTER — Other Ambulatory Visit: Payer: Self-pay | Admitting: Internal Medicine

## 2014-07-28 MED ORDER — METFORMIN HCL 500 MG PO TABS: 500.0000 mg | ORAL_TABLET | Freq: Two times a day (BID) | ORAL | Status: AC

## 2014-07-28 NOTE — Progress Notes (Unsigned)
I have printed the rx for the metformin.  Written prescription for needles in your box.  Need to clarify with her if we need to specify specific type of needle.  If leaving Saturday, will need to come in and pick up.  I apologize for any inconvenience.   Dr Lorin PicketScott

## 2014-07-28 NOTE — Telephone Encounter (Signed)
[facility]  rx in her box.

## 2014-07-28 NOTE — Progress Notes (Signed)
Duplicate message. Spoke with Tacey RuizLeah, & she will pick up Rx today.

## 2014-07-28 NOTE — Telephone Encounter (Signed)
[facility] & [facility] that Rx has been placed up front for pick up.

## 2014-08-09 ENCOUNTER — Encounter: Payer: Self-pay | Admitting: Internal Medicine

## 2014-08-10 ENCOUNTER — Telehealth: Payer: Self-pay | Admitting: Internal Medicine

## 2014-08-10 NOTE — Telephone Encounter (Signed)
I would recommend pt to f/u with her cardiologist.  If she is having increased swelling (previously had concern that some of her cardiac meds were contributing to some of her medications).  I think this would help to clarify multiple issues and to confirm swelling nor coming from a worsening cardiac issue.

## 2014-08-10 NOTE — Telephone Encounter (Signed)
Spoke with pt advised of MDs message 

## 2014-08-10 NOTE — Telephone Encounter (Signed)
Patient Name: Carla Harris  DOB: 09/30/1928    Initial Comment Caller states she needs to speak about her swollen feet. Does the nurse have any advice? The swelling is much worse than normal. Dr Lorin PicketScott has seen the caller's feet swollen before.    Nurse Assessment  Nurse: Scarlette ArStandifer, RN, Heather Date/Time (Eastern Time): 08/10/2014 2:46:11 PM  Confirm and document reason for call. If symptomatic, describe symptoms. ---Caller states she needs to speak about her swollen feet. Does the nurse have any advice? The swelling is much worse than normal. Dr Lorin PicketScott has seen the caller's feet swollen before.  Has the patient traveled out of the country within the last 30 days? ---Not Applicable  Does the patient require triage? ---Yes  Related visit to physician within the last 2 weeks? ---No  Does the PT have any chronic conditions? (i.e. diabetes, asthma, etc.) ---Yes  List chronic conditions. ---COPD     Guidelines    Guideline Title Affirmed Question Affirmed Notes  Leg Swelling and Edema [1] Thigh, calf, or ankle swelling AND [2] bilateral AND [3] 1 side is more swollen    Final Disposition User   See Physician within 4 Hours (or PCP triage) Standifer, RN, Heather    Comments  Caller does not want to make an appt, she does not want to go to the ED or [facility]. Caller will just wait and see what happens and call EMS if she gets scared.

## 2014-08-19 ENCOUNTER — Other Ambulatory Visit: Payer: Self-pay

## 2014-08-19 DIAGNOSIS — E119 Type 2 diabetes mellitus without complications: Secondary | ICD-10-CM

## 2014-08-19 MED ORDER — INSULIN PEN NEEDLE 31G X 8 MM MISC: Status: DC

## 2014-09-28 ENCOUNTER — Ambulatory Visit (INDEPENDENT_AMBULATORY_CARE_PROVIDER_SITE_OTHER): Payer: Medicare Other | Admitting: Nurse Practitioner

## 2014-09-28 ENCOUNTER — Encounter: Payer: Self-pay | Admitting: Nurse Practitioner

## 2014-09-28 VITALS — BP 108/70 | HR 74 | Temp 97.7°F | Resp 16 | Ht 62.0 in | Wt 102.1 lb

## 2014-09-28 DIAGNOSIS — E119 Type 2 diabetes mellitus without complications: Secondary | ICD-10-CM | POA: Diagnosis not present

## 2014-09-28 DIAGNOSIS — R42 Dizziness and giddiness: Secondary | ICD-10-CM | POA: Diagnosis not present

## 2014-09-28 DIAGNOSIS — Z720 Tobacco use: Secondary | ICD-10-CM | POA: Diagnosis not present

## 2014-09-28 NOTE — Progress Notes (Signed)
Subjective:    Patient ID: Carla Harris, female    DOB: 10/12/1928, [age] y.o.   MRN: [id number]  HPI  Carla Harris is a 81 yo female with a CC of following up on her diabetes. She is accompanied by a nurse today as a part of [facility].   1) Would like a medication that is less expense and not in the fridge  134-349 range of BS Woozy when moving from sitting to standing  Sp02 90-97% while walking off 02 for the moment  On 2 L of O2 in the office today.  She is also concerned about her left jaw being swollen x 1 day and a bump on her head.   Note- the nurse is more concerned about these items than the patient. The patient reports she is tired and would like to go home.   Review of Systems  Constitutional: Negative for fever, chills, diaphoresis and fatigue.  Respiratory: Negative for chest tightness, shortness of breath and wheezing.   Cardiovascular: Negative for chest pain, palpitations and leg swelling.  Gastrointestinal: Negative for nausea, vomiting and diarrhea.  Skin: Negative for rash.  Neurological: Negative for dizziness, weakness, numbness and headaches.  Psychiatric/Behavioral: The patient is not nervous/anxious.    Past Medical History  Diagnosis Date  . Anemia   . Diabetes mellitus without complication   . Bronchiectasis   . Colon polyps   . Hypercholesteremia   . S/P AAA repair   . Right rotator cuff tear   . COPD (chronic obstructive pulmonary disease)   . Low serum IgG1 and IgM levels     History   Social History  . Marital Status: Widowed    Spouse Name: N/A  . Number of Children: N/A  . Years of Education: N/A   Occupational History  . Not on file.   Social History Main Topics  . Smoking status: Current Every Day Smoker    Types: Cigarettes  . Smokeless tobacco: Never Used     Comment: Has decreased to about 5 a day  . Alcohol Use: No  . Drug Use: No  . Sexual Activity: Not on file   Other Topics Concern  . Not on file   Social History Narrative     Past Surgical History  Procedure Laterality Date  . Appendectomy    . Abdominal hysterectomy    . Abdominal aortic aneurysm repair    . Rotator cuff repair      Family History  Problem Relation Age of Onset  . COPD Mother   . Liver cancer Sister   . Colon cancer Maternal Grandmother     Allergies  Allergen Reactions  . Advil [Ibuprofen] Swelling  . Ciprofloxacin   . Levaquin [Levofloxacin In D5w]   . Penicillins   . Septra [Sulfamethoxazole-Trimethoprim] Other (See Comments)    Itching and swelling of the face and hands  . Sulfa Antibiotics   . Doxycycline Itching    Current Outpatient Prescriptions on File Prior to Visit  Medication Sig Dispense Refill  . acetylcysteine (MUCOMYST) 20 % nebulizer solution Take 2 mLs by nebulization 2 (two) times daily.    Marland Kitchen. albuterol (PROVENTIL HFA;VENTOLIN HFA) 108 (90 BASE) MCG/ACT inhaler Inhale 1 puff into the lungs every 6 (six) hours as needed for wheezing or shortness of breath.    Marland Kitchen. albuterol-ipratropium (COMBIVENT) 18-103 MCG/ACT inhaler Inhale 2 puffs into the lungs every 6 (six) hours as needed.    Marland Kitchen. aspirin EC 81 MG tablet Take 81  mg by mouth daily.    . cetirizine (ZYRTEC) 10 MG tablet Take 10 mg by mouth daily.    . Cholecalciferol (VITAMIN D3) 2000 UNITS capsule Take 2,000 Units by mouth daily.    Marland Kitchen diltiazem (CARDIZEM) 120 MG tablet Take 120 mg by mouth daily.    Marland Kitchen esomeprazole (NEXIUM) 40 MG capsule Take 40 mg by mouth daily before breakfast. Take 1 capsule twice a day    . Fluticasone-Salmeterol (ADVAIR) 250-50 MCG/DOSE AEPB Inhale 1 puff into the lungs every 12 (twelve) hours.    . formoterol (PERFOROMIST) 20 MCG/2ML nebulizer solution Take 20 mcg by nebulization 2 (two) times daily.    Marland Kitchen glucose blood test strip Use as instructed to check sugars 2-3 times a day Dx: 250.00 100 each 12  . insulin aspart (NOVOLOG FLEXPEN) 100 UNIT/ML FlexPen Inject into the skin.    Marland Kitchen insulin detemir (LEVEMIR) 100 UNIT/ML injection  Inject 0.08 mLs (8 Units total) into the skin at bedtime. 10 mL 1  . Insulin Pen Needle (UNIFINE PENTIPS) 31G X 8 MM MISC Use with sliding scale novolog and levemir insulins. 100 each 3  . metFORMIN (GLUCOPHAGE) 500 MG tablet Take 1 tablet (500 mg total) by mouth 2 (two) times daily with a meal. Two times a day with meal. 180 tablet 3  . montelukast (SINGULAIR) 10 MG tablet Take 10 mg by mouth at bedtime.    . Multiple Vitamin (MULTIVITAMIN) tablet Take 1 tablet by mouth daily.    . pravastatin (PRAVACHOL) 40 MG tablet Take 1 tablet (40 mg total) by mouth daily. 30 tablet 5  . predniSONE (DELTASONE) 20 MG tablet Take 20 mg by mouth 2 (two) times daily with a meal.    . sotalol (BETAPACE) 80 MG tablet Take 80 mg by mouth 2 (two) times daily.    . theophylline (THEODUR) 200 MG 12 hr tablet Take 200 mg by mouth 2 (two) times daily.    Marland Kitchen tiotropium (SPIRIVA) 18 MCG inhalation capsule Place 18 mcg into inhaler and inhale daily.     No current facility-administered medications on file prior to visit.       Objective:   Physical Exam  Constitutional: She is oriented to person, place, and time. She appears well-developed and well-nourished. No distress.  BP 108/70 mmHg  Pulse 74  Temp(Src) 97.7 F (36.5 C) (Oral)  Resp 16  Ht  (1.575 m)  Wt 102 lb 1.9 oz (46.321 kg)  BMI 18.67 kg/m2  SpO2 97%  LMP 07/01/1969   HENT:  Head: Normocephalic and atraumatic.  Right Ear: External ear normal.  Left Ear: External ear normal.  Left side of jaw slightly larger than left. She is non-tender to palpation, no color changes, and no inside mouth findings.   Eyes: Right eye exhibits no discharge. Left eye exhibits no discharge. No scleral icterus.  Cardiovascular: Normal rate, regular rhythm, normal heart sounds and intact distal pulses.  Exam reveals no gallop and no friction rub.   No murmur heard. Pulmonary/Chest: Effort normal.  Decreased breath sounds and barrel chest.   Musculoskeletal:  Normal range of motion. She exhibits no edema or tenderness.  Ambulates slowly and changes positions slowly due to advanced COPD  Neurological: She is alert and oriented to person, place, and time. No cranial nerve deficit. She exhibits normal muscle tone. Coordination normal.  Skin: Skin is warm and dry. No rash noted. She is not diaphoretic.  Bump on head is a flesh colored mole with a  scab because the pt reports she picked at it after hitting with a comb  Psychiatric: She has a normal mood and affect. Her behavior is normal. Judgment and thought content normal.      Assessment & Plan:

## 2014-09-28 NOTE — Progress Notes (Signed)
Pre visit review using our clinic review tool, if applicable. No additional management support is needed unless otherwise documented below in the visit note. 

## 2014-09-28 NOTE — Patient Instructions (Signed)
We will wait until she can talk to Dr. Welford RochePhadke about machines and insulin.  Please contact your Cardiologist to let them know about the wooziness.   Pharmacist looking at the meds is a good idea.  If you want the mole removed and or biopsied please see dermatology.

## 2014-09-29 ENCOUNTER — Telehealth: Payer: Self-pay | Admitting: Internal Medicine

## 2014-09-29 NOTE — Telephone Encounter (Signed)
emmi mailed  °

## 2014-10-02 NOTE — Assessment & Plan Note (Signed)
Still not desiring to quite.

## 2014-10-02 NOTE — Assessment & Plan Note (Signed)
Dizzy upon standing. Chronic issue. Will follow.

## 2014-10-02 NOTE — Assessment & Plan Note (Signed)
Sugars are ranging from 130's to 300's. Pt non-compliant on medications. Pt begrudgingly said yes to a endocrine referral.

## 2014-10-10 ENCOUNTER — Other Ambulatory Visit: Payer: Self-pay

## 2014-10-10 NOTE — Patient Outreach (Signed)
I called Ms. Nola to schedule an appointment to review her medications.  She is having dizziness and weakness.  I spoke to her and I will see her on Friday, October 14, 2014 at 10:45 am with Ma RingsJanci Minor, Charity fundraiserN.

## 2014-10-14 ENCOUNTER — Other Ambulatory Visit: Payer: Self-pay | Admitting: Pharmacist

## 2014-10-14 ENCOUNTER — Other Ambulatory Visit: Payer: Self-pay | Admitting: *Deleted

## 2014-10-14 ENCOUNTER — Encounter: Payer: Self-pay | Admitting: *Deleted

## 2014-10-14 ENCOUNTER — Telehealth: Payer: Self-pay | Admitting: *Deleted

## 2014-10-14 ENCOUNTER — Other Ambulatory Visit: Payer: Self-pay

## 2014-10-14 NOTE — Telephone Encounter (Signed)
[facility] called states ptGentry Harris reported to her today that she is not to take Novolog or Pravastatin per Dr Lorin PicketScott.  Carla Fitzlisabeth wants to confirm as there is no record in chart.  If pt is to be on Novolog is she to use it on a sliding scale if so what is it, if pt is to be on Pravastatin what dose 20 mg or 40 mg.  Pt has Endocrinology appoint on 4.28.16.  Dr Welton FlakesKhan, Cardiologist has d/c'd Diltiazem on 3.31.16.  Please advise

## 2014-10-14 NOTE — Telephone Encounter (Signed)
If she wants to stop the pravastatin - ok.  Regarding the insulin, I have not instructed her to stop the insulin.  I am not sure if another physician told her to stop.  What I have had her on is levemir 8 units q hs.  Need to know how sugars have been running.  If low - I guess one of her other physicians could have told her to stop.  But on my review - she had been running high

## 2014-10-14 NOTE — Patient Outreach (Signed)
Called patient's PCP, Dr. Dale Durhamharlene [name], to follow up about patient's medication therapy after meeting with patient today. Left message with Nitchia.   Patient is under the impression that Dr. Lorin PicketScott had instructed her to stop taking both Novolog and pravastatin. Left message asking provider to clarify if this is Dr. Roby LoftsScott's intention. In addition, if patient is to remain on pravastatin, want to confirm dosing. Patient is in possession of pravastatin 40 mg, as prescribed by Dr. Lorin PicketScott, but her medication list from her cardiologist, Dr. Welton FlakesKhan, shows her to be on pravastatin 20 mg. Also, if patient is to continue taking Novolog, patient will require dosing instructions, as her current prescription directions reference a sliding scale instruction that the patient does not have. Notified provider that patient scheduled a visit with endocrinology, having been referred to do so by Dr. Lorin PicketScott, for 11/10/14. Finally, notified the provider that Ms. Minteer reported that Dr. Welton FlakesKhan, her cardiologist, stopped her diltiazem as of yesterday (10/13/14). Pharmacy was referred for the patient in order to evaluate medications as a potential cause of patient's on going dizziness. Will follow up with patient on 10/26/14 to see if this symptom has resolved with the discontinuation of diltiazem.   If have not heard back from Dr. Roby LoftsScott's office, will follow up with her office on the afternoon of 10/17/14.  Duanne MoronElisabeth Kedron Uno, PharmD Clinical Pharmacist [facility] [phone]

## 2014-10-14 NOTE — Telephone Encounter (Signed)
Carla Harris states she needs more clarification.  She states pt does not have any instructions on the Novolog.  As far as the Pravastatin, Carla Harris wants orders from MD as to what the pt is to be taking. Please advise

## 2014-10-14 NOTE — Patient Outreach (Signed)
[facility] Care Management   10/14/2014  Carla Harris 06/12/1929 [id number]  Carla Harris is an 81 y.o. female  Subjective: Pt states she is feeling tired and run down. States she is continuing to feel swimmy headed and wobbly when she stands. She states her cardiologist took her off her one of her heart meds  to see if it would help, but it's only been one day so no improvement yet. Pt states she knows she should be checking her sugar but she just hates sticking herself and also forgets to take her insulin at night. She states she has not heard from the Endocrinologist's office with her appointment and is not sure it would help for her to go. Pt states she feels down a lot but does not want to be on an antidepressant. She states she feels guilty for smoking, but just can't give it up. Pt states she wished she could get out and go like she did before she was put in the hospital last July. She misses traveling.  Objective:  Filed Vitals:   10/14/14 1048  BP: 116/66  Pulse: 70  Resp: 18  Pulse oximetry on room air is 94% Review of Systems  Constitutional: Positive for malaise/fatigue.  HENT: Positive for congestion.   Respiratory: Positive for cough, sputum production and shortness of breath.   Cardiovascular: Positive for leg swelling.  Musculoskeletal: Positive for falls.  Neurological: Positive for weakness.  Endo/Heme/Allergies: Bruises/bleeds easily.  Psychiatric/Behavioral: Positive for depression. Negative for suicidal ideas.  All other systems reviewed and are negative.   Physical Exam  Vitals reviewed. Constitutional: She is oriented to person, place, and time.  HENT:  Head:    Cardiovascular: Normal rate, regular rhythm and normal heart sounds.   Pulses:      Dorsalis pedis pulses are 2+ on the right side, and 2+ on the left side.  Respiratory: She has decreased breath sounds in the right lower field and the left lower field. She has no wheezes.  She has rhonchi in the right upper field, the right middle field, the left upper field and the left middle field. She has rales in the right upper field, the right middle field, the left upper field and the left middle field.  Musculoskeletal:       Right ankle: She exhibits swelling.       Left ankle: She exhibits swelling.       Right foot: There is swelling.       Left foot: There is swelling.  Neurological: She is alert and oriented to person, place, and time.  Skin: Skin is warm and dry.     Psychiatric: She has a normal mood and affect.  Pt stating she feels depressed but says she does not want to be on an antidepressant.     Current Medications:   Current Outpatient Prescriptions  Medication Sig Dispense Refill  . acetylcysteine (MUCOMYST) 20 % nebulizer solution Take 2 mLs by nebulization 2 (two) times daily.    Marland Kitchen. albuterol (PROVENTIL HFA;VENTOLIN HFA) 108 (90 BASE) MCG/ACT inhaler Inhale 1 puff into the lungs every 6 (six) hours as needed for wheezing or shortness of breath.    Marland Kitchen. albuterol (PROVENTIL) (2.5 MG/3ML) 0.083% nebulizer solution Inhale into the lungs.    Marland Kitchen. albuterol-ipratropium (COMBIVENT) 18-103 MCG/ACT inhaler Inhale 2 puffs into the lungs every 6 (six) hours as needed.    Marland Kitchen. aspirin EC 81 MG tablet Take 81 mg by mouth daily.    .Marland Kitchen  cetirizine (ZYRTEC) 10 MG tablet Take 10 mg by mouth daily.    . Cholecalciferol (VITAMIN D3) 2000 UNITS capsule Take 2,000 Units by mouth daily.    Marland Kitchen diltiazem (CARDIZEM) 120 MG tablet Take 120 mg by mouth daily.    Marland Kitchen esomeprazole (NEXIUM) 40 MG capsule Take 40 mg by mouth daily before breakfast. Take 1 capsule twice a day    . Fluticasone-Salmeterol (ADVAIR) 250-50 MCG/DOSE AEPB Inhale 1 puff into the lungs every 12 (twelve) hours.    . formoterol (PERFOROMIST) 20 MCG/2ML nebulizer solution Take 20 mcg by nebulization 2 (two) times daily.    . furosemide (LASIX) 20 MG tablet Take 20 mg by mouth.    Marland Kitchen glucose blood test strip Use as  instructed to check sugars 2-3 times a day Dx: 250.00 100 each 12  . hydrOXYzine (ATARAX/VISTARIL) 25 MG tablet Take 25 mg by mouth 3 (three) times daily as needed.    . insulin aspart (NOVOLOG FLEXPEN) 100 UNIT/ML FlexPen Inject into the skin.    Marland Kitchen insulin detemir (LEVEMIR) 100 UNIT/ML injection Inject 0.08 mLs (8 Units total) into the skin at bedtime. 10 mL 1  . Insulin Pen Needle (UNIFINE PENTIPS) 31G X 8 MM MISC Use with sliding scale novolog and levemir insulins. 100 each 3  . metFORMIN (GLUCOPHAGE) 500 MG tablet Take 1 tablet (500 mg total) by mouth 2 (two) times daily with a meal. Two times a day with meal. 180 tablet 3  . montelukast (SINGULAIR) 10 MG tablet Take 10 mg by mouth at bedtime.    . Multiple Vitamin (MULTIVITAMIN) tablet Take 1 tablet by mouth daily.    . pravastatin (PRAVACHOL) 40 MG tablet Take 1 tablet (40 mg total) by mouth daily. (Patient not taking: Reported on 10/14/2014) 30 tablet 5  . predniSONE (DELTASONE) 20 MG tablet Take 20 mg by mouth 2 (two) times daily with a meal.    . rivaroxaban (XARELTO) 10 MG TABS tablet Take 10 mg by mouth daily.    . sotalol (BETAPACE) 80 MG tablet Take 80 mg by mouth 2 (two) times daily.    . theophylline (THEODUR) 200 MG 12 hr tablet Take 200 mg by mouth 2 (two) times daily.    Marland Kitchen tiotropium (SPIRIVA) 18 MCG inhalation capsule Place 18 mcg into inhaler and inhale daily.     No current facility-administered medications for this visit.    Functional Status:   In your present state of health, do you have any difficulty performing the following activities: 10/14/2014  Is the patient deaf or have difficulty hearing? N  Hearing N  Vision N  Difficulty concentrating or making decisions Y  Walking or climbing stairs? N  Doing errands, shopping? Y  Preparing Food and eating ? N  Using the Toilet? N  In the past six months, have you accidently leaked urine? N  Do you have problems with loss of bowel control? N  Managing your Medications? Y    Managing your Finances? N  Housekeeping or managing your Housekeeping? Y    Fall/Depression Screening:    PHQ 2/9 Scores 10/14/2014 10/14/2014 07/27/2013 07/10/2012  PHQ - 2 Score 0  PHQ- 9 Score 12 - 5 -   [facility] CM Care Plan        Patient Outreach from 10/14/2014 in [facility] Most recent reading at 10/14/2014  1:34 PM   Care Plan Problem One  Pt wants to increase her strength to be able to travel  with her children   Care Plan for Problem One  Active   Interventions for Problem One Long Term Goal  Discussed with pt the disease zones related to COPD and when to cal the MD. Pharmacy consult to assist with medication management  and adherence.   [facility] Long Term Goal (31-90 days)  Pt will not be addmittd to the hospital with COPD related symptoms in the next 90days.   [facility] Long Term Goal Start Date  10/14/14   [facility] CM Short Term Goal #1 (0-30 days)  Pt will visit endocrinologist in the next 30 days.   [facility] CM Short Term Goal #1 Start Date  10/14/14   [facility] CM Short Term Goal #2 (0-30 days)  Pt will check am blood sugar every day until the endocrinology appointment    [facility] CM Short Term Goal #2 Start Date  10/14/14       Assessment: Assessment completed, Pt historian. Pt noted to be anxious this visit getting up several times to smoke and being fidgety. RNCM, pharmacist and pharmacist orientee present in patient's home asking her questions noted to make pt anxious.  Pt noted to be confused on some of her medication's  indications and some of the appropriate doses and times. Using O2 for 1/2 of the visit at 2lpm via nasal canula. Loose congested cough noted pt reports white mucus at times. Unable to assess previous blood sugars because glucometer >34years old and had no available memory. Pt reported she had not been checking her sugar regularly. Pt denies following a diabetic diet. Pt's bilateral feet noted to have pitting edema, and pt admits to being unable to wear her regular shoes.  On the bottom of her Left foot there was a 2cm dark round bruise. Bilateral pedal pulses present.   Plan: RNCM confirmed Endocrinology appointment with Dr. Ephriam Jenkins office and had pt write it on her calendar. RNCM assisted pt in making a chart to write in morning blood sugars for the month of April. Also made a reminder note to place near morning meds to assist pt in remembering to check sugar. RNCM showed pt and her son the area to the bottom of pt's left foot to keep a check for worsening.  RNCM provided pt with a larger pill organizer and Pharmacists correctly placed meds in box for pt. RNCM awaiting a call from pt to see if pt wants RNCM to attend visit with the Endocrinologist.  RNCM to see pt in 1 month on 5/2   Krystal Delduca RN, BSN  [facility] Care Management [phone])

## 2014-10-14 NOTE — Patient Instructions (Addendum)
Basics of Medication Management UNDERSTAND YOUR MEDICATIONS  Read all of the labels and inserts that come with your medications. Review the information on this form often.  Know what potential side effects to look for (for each medication).  Know what each of your medications look like (by color, shape, size, stamp). If you are getting confused and having a hard time telling them apart, talk with your caregiver or pharmacist. They may be able to change the medication or help you to identify them more easily.  Check with your pharmacist if you notice a difference in the size or color of your medication.  Get all of your medications at one pharmacy. The pharmacist will have all of your information and understand possible drug interactions.  Ask your caregiver questions about your prescriptions and any over-the-counter medications, vitamins, herbal or dietary supplements that you take. TAKE YOUR MEDICATION SAFELY  Take medications only as prescribed.  Talk with your caregiver or pharmacist if some of your pills look the same and it is difficult to tell them apart. They can help you to recognize different medications.  Never double up on your medication.  Never take anyone else's medication or share your medications.  Do not stop taking your medication(s) unless you have discussed it with your caregiver.  Do not split, mash, or chew medications unless your caregiver tells you to do so. Tell your caregiver if you have trouble swallowing your medication(s).  For liquid medications, make sure you use the dosing container provided to you.  You may need to avoid alcohol or certain foods or liquids with one or more of your medications. Make sure you remember how to take each medication with some of the tools below. ORGANIZE YOUR MEDICATIONS  Use a tool, such as a weekly pill box (available at your local pharmacy), written chart from your caregiver, notebook, binder, or your own calendar to  organize your daily medications. Please note: if you are having trouble telling your different medications apart, keep them in the original bottles.  Set cues or reminders for taking your medications. Use watch alarms, mobile device/phone calendar alarms, or sticky notes.  More advanced medication management systems are also available. These offer weekly or monthly options complete with storage, alarms, and visual and audio prompts.  Your system should help you to keep track of the:  Name of medication and dose.  Day.  Time.  Pill to take (by color, shape, size, or name/imprint).  How to take it (with or without certain foods, on an empty stomach, with fluids etc.).  Review your medication schedule with a family member or friend to help you. Other household members should understand your medications.  If you are taking medications on an "as needed" basis such as those for nausea or pain, write down the name, dose, and time you took the medication so that you remember what you have taken. PLAN AHEAD FOR REFILLS AND TRAVEL  Take your pill box, medications, and calendar system with you when you travel.  Plan ahead for refills as to not run out of your medication(s).  Always carry an updated list of your medications with you. If there is an emergency, a respondent can quickly see what medications you are taking. STORE AND DISCARD YOUR MEDICATIONS SAFELY  Store medications in a cool, dry area away from light. (The bathroom is not a good place for storage because of heat and humidity.)  Store your medications away from chemicals, pet medications, or other family member's   medications.  Keep medications out of children's reach, away from counters and bedside tables. Store them up high in cabinets or shelves.  Check expiration dates regularly.  Learn about the best way to dispose of each medication you take. Find out if your local government recycling program has a Medicine Take Back  program for safe disposal. If not, some medications may be mixed with inedible substances and thrown away in the trash. Certain medications are to be flushed down the toilet. REMEMBER:  Tell your caregiver if you experience side effects, new symptoms, or have other concerns. There may be dosing changes or alternative medications that would be better for you.  Review your medications regularly with your caregiver. Check to see if you need to continue to take each medication, and discuss how well they are working. Medicines, diet, medical conditions, weight changes, and other habits can all affect how medicines work.  Document Released: 10/16/2010 Document Revised: 09/23/2011 Document Reviewed: 10/16/2010 Exit[employer] Patient Information 2015 [employer]. This information is not intended to replace advice given to you by your health care provider. Make sure you discuss any questions you have with your health care provider.  

## 2014-10-14 NOTE — Telephone Encounter (Signed)
Spoke with Carla Harris.  Pt is willing to take her pravastatin.  Ok to restart.

## 2014-10-14 NOTE — Telephone Encounter (Signed)
Spoke with Carla Harris, advised of MDs message.  She is requesting to speak one on one with Dr Lorin PicketScott.  [phone].

## 2014-10-14 NOTE — Telephone Encounter (Signed)
She is not on novolog.  The only insulin she has been taking is levemir.

## 2014-10-14 NOTE — Patient Instructions (Addendum)
Carla Harris,  It was a pleasure meeting with you today. Here are some of the items we discussed. You and Carla Harris get out and enjoy the sunshine.  Happy Spring time!  Carla Harris  Make sure you check your sugar every morning and write it down until your appointment with Dr. Welford RochePhadke the Endocrinologist. Also keep a check on that small area to the bottom of your left foot.    Blood Glucose Monitoring Monitoring your blood glucose (also know as blood sugar) helps you to manage your diabetes. It also helps you and your health care provider monitor your diabetes and determine how well your treatment plan is working. WHY SHOULD YOU MONITOR YOUR BLOOD GLUCOSE?  It can help you understand how food, exercise, and medicine affect your blood glucose.  It allows you to know what your blood glucose is at any given moment. You can quickly tell if you are having low blood glucose (hypoglycemia) or high blood glucose (hyperglycemia).  It can help you and your health care provider know how to adjust your medicines.  It can help you understand how to manage an illness or adjust medicine for exercise. WHEN SHOULD YOU TEST? Your health care provider will help you decide how often you should check your blood glucose. This may depend on the type of diabetes you have, your diabetes control, or the types of medicines you are taking. Be sure to write down all of your blood glucose readings so that this information can be reviewed with your health care provider. See below for examples of testing times that your health care provider may suggest. Type 2 Diabetes  It can vary with each person, but generally, if you are on insulin, test 4 times a day.  If you take medicines by mouth (orally), test 2 times a day.  If you are on a controlled diet, test once a day.  If your diabetes is not well controlled or if you are sick, you may need to monitor more often. HOW TO MONITOR YOUR BLOOD GLUCOSE Supplies Needed  Blood glucose  meter.  Test strips for your meter. Each meter has its own strips. You must use the strips that go with your own meter.  A pricking needle (lancet).  A device that holds the lancet (lancing device).  A journal or log book to write down your results. Procedure  Wash your hands with soap and water. Alcohol is not preferred.  Prick the side of your finger (not the tip) with the lancet.  Gently milk the finger until a small drop of blood appears.  Follow the instructions that come with your meter for inserting the test strip, applying blood to the strip, and using your blood glucose meter. Other Areas to Get Blood for Testing Some meters allow you to use other areas of your body (other than your finger) to test your blood. These areas are called alternative sites. The most common alternative sites are:  The forearm.  The thigh.  The back area of the lower leg.  The palm of the hand. The blood flow in these areas is slower. Therefore, the blood glucose values you get may be delayed, and the numbers are different from what you would get from your fingers. Do not use alternative sites if you think you are having hypoglycemia. Your reading will not be accurate. Always use a finger if you are having hypoglycemia. Also, if you cannot feel your lows (hypoglycemia unawareness), always use your fingers for your blood glucose checks.  ADDITIONAL TIPS FOR GLUCOSE MONITORING  Do not reuse lancets.  Always carry your supplies with you.  All blood glucose meters have a 24-hour "hotline" number to call if you have questions or need help.  Adjust (calibrate) your blood glucose meter with a control solution after finishing a few boxes of strips. BLOOD GLUCOSE RECORD KEEPING It is a good idea to keep a daily record or log of your blood glucose readings. Most glucose meters, if not all, keep your glucose records stored in the meter. Some meters come with the ability to download your records to your  home computer. Keeping a record of your blood glucose readings is especially helpful if you are wanting to look for patterns. Make notes to go along with the blood glucose readings because you might forget what happened at that exact time. Keeping good records helps you and your health care provider to work together to achieve good diabetes management.  Document Released: 07/04/2003 Document Revised: 11/15/2013 Document Reviewed: 11/23/2012 [facility] Patient Information 2015 [place], [place]. This information is not intended to replace advice given to you by your health care provider. Make sure you discuss any questions you have with your health care provider.

## 2014-10-14 NOTE — Patient Outreach (Signed)
Ms. Carla Harris is an 81 year old female who was referred to me by Carla RingsJanci Minor, RN, nurse care manager in regards to her medications and having dizziness symptoms.  I am visiting Ms. Carla Harris in the home with Carla Minor, RN and Carla Harris, PharmD.  Ms. Carla Harris reported she saw Dr. Welton Harris, her cardiologist, yesterday and he stopped her Diltiazem and increased her Sotolol to 160 mg twice a day.  She is filling her pill box regularly but places both her morning doses and her nightly doses in the same compartment.  She is willing to use the pill box we can provide for her.  She stated she is not using her Spiriva daily.  She only uses it when she is short of breathe.  She is using her Combivent regularly instead.  She also reports not taking her Levemir daily due to not remembering it since it is in the refrigerator.  She stated was instructed not to take Aspirin, Novolog and Pravastatin as well.  Due to her having multiple medication adherence issues today and the cardiologist stopping her diltiazem, we were not able to address other medications that may be causing the dizziness.    A: 1.  Medication Adherence:  Ms. Carla Harris has not been adherent to her Levemir and Spiriva.  She misunderstood how to take the Spiriva and forgot to take the Levemir out of the refrigerator.   2.  Pravastatin:  It is unclear why this was stopped.  She does not remember who instructed her to stop it.  3.  Novolog:  It is unclear why she was prescribed Novolog and then instructed not to take it.  4.  Dizziness:  Diltiazem was discontinued by her cardiologist to see if it was causing the dizziness.  Other medications contributing to dizziness may be Sotolol and Theophylline.    P: 1.  I filled her pill box up with her medications using only 2 compartments to assist with her adherence.  2.  I will follow up with Dr. Lorin Harris regarding her Pravastatin and Novolog. 3.  I educated her on the proper use of Spiriva and she will use it daily.   She will use the Combivent only as needed now.  4.  She will keep her Levemir next to her pill box to help her remember to take it daily.   5.  I will follow up in 2 weeks to review her adherence and her symptoms of dizziness.

## 2014-10-17 ENCOUNTER — Other Ambulatory Visit: Payer: Self-pay | Admitting: Pharmacist

## 2014-10-17 NOTE — Patient Outreach (Signed)
Dr. Lorin PicketScott returned my phone call regarding Ms. Witherell's Novolog and pravastatin. Dr. Lorin PicketScott advised that Ms. Cicero may continue on pravastatin. Dr. Lorin PicketScott had previously discussed discontinuing this medication due to pill burden concerns with Ms. Farinas's daughter. However, Dr. Lorin PicketScott states that patient can continue this therapy if patient is willing to do so. Dr. Lorin PicketScott confirmed that she did advise Ms. Buysse to not take the Novolog at this time due to provider's concerns about hypoglycemia risk with patient using a sliding scale short acting regimen.  Notified provider that patient scheduled a visit with endocrinology for 11/10/14. Also notified the provider that Ms. Naab reported that Dr. Welton FlakesKhan, her cardiologist, stopped her diltiazem as of 10/13/14. Will follow up with patient by phone today to let her know Dr. Roby LoftsScott's recommendations.   Duanne MoronElisabeth Maloree Uplinger, PharmD Clinical Pharmacist [facility] Care Management [phone]

## 2014-10-17 NOTE — Patient Outreach (Signed)
Called to follow up with [name] regarding her inquiry about whether her PCP, Dr. Lorin PicketScott, intended for her to discontinue her pravastatin and Novolog medications. Let patient know that per Dr. Lorin PicketScott, [name] may continue on pravastatin. Helped patient to identify this bottle in her home and discussed adding this medication to her pillbox in the "Evening" slot.  Also let patient know that Dr. Lorin PicketScott confirmed that she did advise [name] to not take the Novolog at this time. Discussed with patient and confirmed patient understanding that at this time Levemir will be her sole insulin therapy.  Followed up with patient about her feelings of dizziness. Patient states that so far her dizziness is unchanged since discontinuing her diltiazem therapy. She reports that she is fine at rest, has the most trouble when going from sitting to standing, but does still feel somewhat "wobble legged" when walking around her home. Patient reports that she is careful in order to avoid falling when she feels this way. She also reports that when doing activities such as showering, she has her son in the home in case she needs assistance. Reminded [name] that I will follow up with her again on 10/26/14 in her home, along with Steve Rattlerawn Pettus, PharmD.   Carla MoronElisabeth Blessin Kanno, PharmD Clinical Pharmacist [facility] Care Management [phone]

## 2014-10-18 ENCOUNTER — Encounter: Payer: Self-pay | Admitting: *Deleted

## 2014-10-26 ENCOUNTER — Other Ambulatory Visit: Payer: Self-pay

## 2014-10-28 ENCOUNTER — Other Ambulatory Visit: Payer: Self-pay

## 2014-10-28 NOTE — Patient Outreach (Signed)
[facility] Care Management  [facility]   10/28/2014  Carla Harris 05/28/1929 [id number]  Subjective:  Carla Harris is an 81 year old female who I am following up on in regards to her dizziness and medication adherence.  She stated she is still feeling dizzy even after stopping the diltiazem.  She did state she has started taking her Levemir daily and has not forgotten it since she has placed it next to her meter instead of the refrigerator.  She stated she did not restart her Pravachol because she was unsure if she really needed it.  I explained that Duanne MoronElisabeth Dhalla, PharmD had discussed this with her provider and she did need to start it.  I went ahead and placed it in her pill box.  She had filled her pill box correctly.  Carla Harris stated she will be following up with her cardiologist tomorrow.    Objective:   Current Medications: Current Outpatient Prescriptions  Medication Sig Dispense Refill  . acetylcysteine (MUCOMYST) 20 % nebulizer solution Take 2 mLs by nebulization 2 (two) times daily.    Marland Kitchen. albuterol (PROVENTIL) (2.5 MG/3ML) 0.083% nebulizer solution Inhale into the lungs.    Marland Kitchen. albuterol-ipratropium (COMBIVENT) 18-103 MCG/ACT inhaler Inhale 2 puffs into the lungs every 6 (six) hours as needed.    Marland Kitchen. aspirin EC 81 MG tablet Take 81 mg by mouth daily.    . cetirizine (ZYRTEC) 10 MG tablet Take 10 mg by mouth daily.    . Cholecalciferol (VITAMIN D3) 2000 UNITS capsule Take 2,000 Units by mouth daily.    Marland Kitchen. esomeprazole (NEXIUM) 40 MG capsule Take 40 mg by mouth daily before breakfast. Take 1 capsule twice a day    . Fluticasone-Salmeterol (ADVAIR) 250-50 MCG/DOSE AEPB Inhale 1 puff into the lungs every 12 (twelve) hours.    . furosemide (LASIX) 20 MG tablet Take 20 mg by mouth.    Marland Kitchen. glucose blood test strip Use as instructed to check sugars 2-3 times a day Dx: 250.00 100 each 12  . hydrOXYzine (ATARAX/VISTARIL) 25 MG tablet Take 25 mg by mouth 3 (three) times  daily as needed.    . insulin detemir (LEVEMIR) 100 UNIT/ML injection Inject 0.08 mLs (8 Units total) into the skin at bedtime. 10 mL 1  . Insulin Pen Needle (UNIFINE PENTIPS) 31G X 8 MM MISC Use with sliding scale novolog and levemir insulins. 100 each 3  . metFORMIN (GLUCOPHAGE) 500 MG tablet Take 1 tablet (500 mg total) by mouth 2 (two) times daily with a meal. Two times a day with meal. 180 tablet 3  . montelukast (SINGULAIR) 10 MG tablet Take 10 mg by mouth at bedtime.    . predniSONE (DELTASONE) 20 MG tablet Take 20 mg by mouth 2 (two) times daily with a meal.    . rivaroxaban (XARELTO) 10 MG TABS tablet Take 10 mg by mouth daily.    . sotalol (BETAPACE) 80 MG tablet Take 80 mg by mouth 2 (two) times daily.    . theophylline (THEODUR) 200 MG 12 hr tablet Take 200 mg by mouth 2 (two) times daily.    Marland Kitchen. tiotropium (SPIRIVA) 18 MCG inhalation capsule Place 18 mcg into inhaler and inhale daily.    Marland Kitchen. albuterol (PROVENTIL HFA;VENTOLIN HFA) 108 (90 BASE) MCG/ACT inhaler Inhale 1 puff into the lungs every 6 (six) hours as needed for wheezing or shortness of breath.    . diltiazem (CARDIZEM) 120 MG tablet Take 120 mg by mouth daily.    .Marland Kitchen  formoterol (PERFOROMIST) 20 MCG/2ML nebulizer solution Take 20 mcg by nebulization 2 (two) times daily.    . insulin aspart (NOVOLOG FLEXPEN) 100 UNIT/ML FlexPen Inject into the skin.    . Multiple Vitamin (MULTIVITAMIN) tablet Take 1 tablet by mouth daily.    . pravastatin (PRAVACHOL) 40 MG tablet Take 1 tablet (40 mg total) by mouth daily. (Patient not taking: Reported on 10/14/2014) 30 tablet 5   No current facility-administered medications for this visit.    Functional Status: In your present state of health, do you have any difficulty performing the following activities: 10/14/2014  Hearing? N  Vision? N  Difficulty concentrating or making decisions? N  Walking or climbing stairs? Y  Dressing or bathing? N  Doing errands, shopping? Y  Preparing Food and  eating ? N  Using the Toilet? N  In the past six months, have you accidently leaked urine? N  Do you have problems with loss of bowel control? N  Managing your Medications? Y  Managing your Finances? N  Housekeeping or managing your Housekeeping? Y    Fall/Depression Screening: PHQ 2/9 Scores 10/14/2014 10/14/2014 07/27/2013 07/10/2012  PHQ - 2 Score 0  PHQ- 9 Score 12 - 5 -    Assessment: 1.  Dizziness:  Carla Harris is still experiencing dizziness despite stopping her diltiazem.  She has multiple medications that may be contributing to it.  After she sees the cardiologist, I will review her medications again to see if any recommendations can be made to stop some.  2.  Medication adherence:  She is more adherent to her Levemir and Spiriva.  Plan: 1.  I will call Carla Harris in 2 days to determine if any changes were made by the cardiologist.  I will make further recommendations after I talk to her.  2.  I congratulated Carla Harris on being compliant with her Levemir and Spiriva. 3.  I placed Pravastatin in her pill box to help her be more adherent to it.  I also instructed her to take it at night.    Steve Rattler, PharmD, [employer] [facility] [employer] [facility] [phone]

## 2014-10-28 NOTE — Patient Outreach (Signed)
I called Ms. Carla Harris to determine if her cardiologist stopped any of her medications due to her dizziness.  She stated he did not.  He did not feel like he could stop any more than the diltiazem.  She stated she is going to have a heart catherization on Tuesday, November 01, 2014.  I stated the pharmacy team would review her medications again to see if any could be related to the dizziness.  The pharmacy team will follow up in the next week.    Steve Rattlerawn Kaeley Vinje, PharmD, [employer] [facility] Pharmacy Manager [phone]

## 2014-11-01 NOTE — H&P (Signed)
PATIENT NAME:  Carla Harris, Carla Harris MR#:  [id number] DATE OF BIRTH:  05/20/1929  DATE OF ADMISSION:  07/01/2012  PRIMARY CARE PHYSICIAN: Carla Harris, M.D.   CHIEF COMPLAINT: Shortness of breath and cough along with wheezing.   HISTORY OF PRESENT ILLNESS: This is an 81 year old Caucasian female patient with history of bronchiectasis on as-needed home oxygen of 2 liters, along with diabetes, presents to the hospital as a direct admit from [facility] with COPD exacerbation. The patient tried 2 nebulizer treatments at home, along with 2 extra doses of her prednisone, which did not improve her symptoms. Presented to [facility] where she had 2 extra doses of nebulizer treatment, did not improve and was physically admitted to the hospitalist service for further workup and treatment. I discussed case with her primary care physician, Carla Harris. The patient saw her pulmonologist, Dr. Meredeth Harris, 1 week prior. Was placed on prednisone and azithromycin, with which she has not improved. She has finished her azithromycin at this time. She does have yellow productive cough, wheezing. The patient mentions that she does not wear oxygen all the time. She wears it as needed. Does not have any trouble breathing at baseline at rest, but with minimal exertion gets shortness of breath, but presently is feeling extremely short of breath even at rest. Here at the hospital, her saturations were 87% on room air and 96% on 3 liters oxygen. An x-ray has been ordered by me and is pending.   PAST MEDICAL HISTORY:  1.  COPD.  2.  Bronchiectasis.  3.  Hyperlipidemia.  4.  Anemia.  5.  Colon polyps.  6.  Low IgG.  7.  Diabetes mellitus type 2.  8.  Recurrent UTIs.   PAST SURGICAL HISTORY: Appendectomy, hysterectomy, AAA repair, right rotator cuff repair.   HOME MEDICATIONS: Include: 1.  Advair Diskus 250/50, 1 puff twice a day.  2.  Aspirin 81 mg oral daily.  3.  Nexium 40 mg oral daily.  4.  Oxygen 2 liters via nasal  cannula as needed.  5.  Singulair 10 mg oral daily.  6.  Vitamin D 2000 units 1 capsule once a day.  7.  Zyrtec 10 mg oral twice a day.  8.  Albuterol nebulizer every 4 hours as needed for shortness of breath.  9.  Combivent inhaler 2 puffs 4 times a day.  10.  Tylenol 500 mg 2 tablets by mouth as needed for pain.  11.  Glucophage 500 mg oral once a day.  12.  Pravachol 20 mg oral once a day.  13.  Fluticasone 50 mcg 2 sprays per nostril daily.  14.  Theophylline 200 mg by mouth twice a day.   ALLERGIES: ADVIL, IBUPROFEN, LEVAQUIN, SEPTRA, SULFA, PENICILLINS AND CIPROFLOXACIN.   FAMILY HISTORY: Her mother died of emphysema. Her father is living, is 22 years old.   SOCIAL HISTORY: The patient lives with her son. She continues to smoke 1/2 pack a day. No alcohol. No illicit drugs.   REVIEW OF SYSTEMS: CONSTITUTIONAL: Complains of fatigue and weakness. No weight loss or weight gain.  EYES: No blurred vision, pain, discharge.  ENT: No tinnitus, hearing loss or sinus congestion.  RESPIRATORY: Has cough productive of sputum, shortness of breath.  CARDIOVASCULAR: No chest pain, orthopnea, PND, edema.  GASTROINTESTINAL: No nausea, vomiting, diarrhea, constipation, abdominal pain.  GENITOURINARY: No urinary frequency, incontinence or hematuria.  ENDOCRINE: No thyroid problems. Has diabetes. No polyuria.  HEMATOLOGIC, LYMPHATIC: Has anemia. No easy bruising or bleeding.  PSYCHIATRIC: No anxiety or depression.  NEUROLOGIC: No focal weakness, dysarthria or numbness.  SKIN: No rash or ulcers.   PHYSICAL EXAMINATION:  VITAL SIGNS: Temperature 97.6, pulse 99, respirations 22, blood pressure 152/85, saturating 87% on room air and 96% on 3 liters.  GENERAL: Moderately built Caucasian female patient, lying in bed in significant respiratory distress, having conversational dyspnea.  PSYCHIATRIC: Alert, oriented x 3. Mood and affect appropriate. Judgment intact.  HEENT: Atraumatic, normocephalic. Oral  mucosa moist and pink. External ears and nose normal. No pallor. No icterus. Pupils bilaterally equal and reactive to light.  NECK: Supple. No thyromegaly. No palpable lymph nodes. Trachea midline. No carotid bruit or JVD.  CARDIOVASCULAR: S1, S2, tachycardic, without any murmurs. Peripheral pulses 2+. No edema. RESPIRATORY: Increased work of breathing, using accessory muscles, bilateral wheezing, decreased air entry at the bases.  GASTROINTESTINAL: Soft abdomen, nontender. Bowel sounds present. No hepatosplenomegaly palpable.  SKIN: Warm and dry. No petechiae, rash, ulcers.  MUSCULOSKELETAL: No joint swelling, redness, effusion of the large joints. Normal muscle tone.  NEUROLOGICAL: Motor is 5/5 in upper and lower extremities. Sensation to fine touch intact all over.   LABORATORY STUDIES: Unavailable at this time.   ASSESSMENT AND PLAN:  1.  Acute chronic obstructive pulmonary disease exacerbation with acute on chronic respiratory failure: Will get a chest x-ray and CBC to look for any pneumonia. Nebulizers every 4 hours, along with starting on intravenous steroids. THE PATIENT IS ALLERGIC TO LEVAQUIN. Has been on azithromycin recently. Will start her on doxycycline at this time to cover for any infections along with the chronic obstructive pulmonary disease exacerbation. Will try to wean off oxygen as possible. The patient does seem to have a severe flare-up and I suspect the patient to be in the hospital for greater than 2 days.  2.  Diabetes mellitus: Continue the Glucophage the patient is on. Considering she will be on intravenous steroids, will put her on sliding scale insulin with close monitoring as her blood sugars might run high.  3.  Tobacco abuse: The patient has been counseled for more than 3 minutes. She mentions that she is trying to quit, presently is down to 1/2 pack a day. I have offered a nicotine patch, but the patient has refused that and mentions she can stay off smoking while in  the hospital. Is determined to quit.  4.  Deep vein prophylaxis with Lovenox.   CODE STATUS: Full code.   TIME SPENT: Time spent today on this case was 60 minutes, with more than 50% of time spent in coordination of care.   ____________________________ Molinda BailiffSrikar R. Dennies Coate, MD srs:jm D: 07/01/2012 16:03:32 ET T: 07/01/2012 16:46:25 ET JOB#: [id number]  cc: Wardell HeathSrikar R. Honest Safranek, MD, <Dictator> Carla Durhamharlene [name], MD Orie FishermanSRIKAR R Lonie Rummell MD ELECTRONICALLY SIGNED 07/01/2012 17:16

## 2014-11-04 NOTE — Discharge Summary (Signed)
PATIENT NAME:  Carla Harris, Carla Harris MR#:  [id number] DATE OF BIRTH:  05-17-1929  ADMITTING DIAGNOSIS: Chronic obstructive pulmonary disease exacerbation with acute on chronic respiratory failure.   DISCHARGE DIAGNOSES: 1. Acute on chronic respiratory failure due to chronic obstructive pulmonary disease exacerbation.  2.  Bronchitis.  3.  Diabetes mellitus with hemoglobin A1c of 6.7.  4. History of bronchiectasis, hyperlipidemia, anemia, colon polyps, frequent urinary tract infections.   DISCHARGE CONDITION:  Stable.   DISCHARGE MEDICATIONS: The patient is to resume her outpatient medications which are Zyrtec 5 mg 2 tablets once a day, aspirin 81 mg p.o. daily, Singular 10 mg p.o. daily, Pravachol 10 mg p.o. daily, omeprazole 40 mg p.o. daily, albuterol ipratropium inhalation every 6 hours as needed.  I did discuss 1 inhalation twice a day, DuoNebs as needed, NicoDerm CQ 21 mg in 24 hour transdermal film one topically daily, prednisone 60 mg p.o. daily.  New medications:  codeine sulfate 30 mg p.o. every 6 hours as needed, metformin 500 mg p.o. twice daily. This is a new dose, Vibramycin 100 mg p.o. twice a day for 10 more days.  Home oxygen with portable tank at 2 liters of oxygen through nasal cannula.   DIET: 2 grams salt, low fat, low cholesterol, carbohydrate controlled diet, regular consistency.   ACTIVITY LIMITATIONS:  As tolerated.   FOLLOWUP: Follow-up appointment with Carla Durhamharlene [name], MD in 2 days after discharge as well as Dr. Meredeth Harris in 1 week after discharge.   CONSULTANTS: Care management.   RADIOLOGIC STUDIES: Chest x-ray PA and lateral, on 07/02/2012, showed findings which are consistent with COPD, subsegmental atelectasis in the retrocardiac region may be present. There is some thickening of the fissures versus a small amount of pleural fluid.  Followup films or CT scan are recommended to show response to therapy according to the radiologist.   HISTORY OF PRESENT ILLNESS: The patient  is an 81 year old Caucasian female with history of chronic respiratory failure, on oxygen therapy at home who presented to the hospital with complaints of shortness of breath, wheezing as well as cough. Please refer to Dr. Eddie NorthSudini's admission on 07/01/2012.  On arrival to the hospital, the patient's temperature was 97.6, pulse 99, respiration was 22, blood pressure 152/85, saturation 87% on room air and 96% on 3 liters of oxygen through nasal cannula. The patient did have significant conversational dyspnea. She had increased work of breathing using accessory muscles, also bilateral wheezing and decreased air entrance at bases.  Her physical examination was otherwise unremarkable.   LABORATORY DATA: On arrival to the Emergency Room on the 07/01/2012 showed mild elevation of glucose 214, otherwise BMP was unremarkable. The patient's liver enzymes were not checked. The patient's CBC was within normal limits, except, absolute neutrophil count was elevated at 7.7.   HOSPITAL COURSE: The patient was admitted to the hospital with a diagnosis of acute on chronic respiratory failure, COPD exacerbation. She was started on IV steroids and inhalation therapy as well as oxygen and antibiotics with Rocephin with doxycycline.  With this therapy she improved; however, over the course of 48 hours while in the hospital, she improve significantly. She was able to return back to her normal oxygen requirements of 2 liters of oxygen through nasal cannula with good oxygenations at 92 to 95% on room air at rest. She was advised to continue antibiotics as well as steroids at high doses of 60 mg p.o. daily dose.  She is to follow up with Dr. Meredeth Harris for further recommendations in  regards to tapering steroid doses. She is to continue also inhalation therapy as she usually does. We were attempting to get sputum cultures, however, they were not available by the day of discharge. She was not able to lift up any phlegm.  She was advised,  however, to continue codeine as needed for cough.   In regards to diabetes mellitus, hemoglobin A1c was checked and was found was found to be 6.7.  The patient is to continue diabetic diet.  History of hyperlipidemia.  The patient is to continue her outpatient medications Pravachol.   The patient is being discharged in stable condition with the above-mentioned medications and follow-up. Her vital signs on the day of discharge: Temperature 97.9, pulse ranging from 90s to 100s, respiratory rate was 17 to 18, blood pressure 136/61, saturation 92% to 95% on 2 liters of oxygen through nasal cannula at rest.   TIME SPENT:  40 minutes.    ____________________________ Carla Caper, MD rv:ct D: 07/03/2012 16:10:00 ET T: 07/05/2012 14:57:50 ET JOB#: [id number]  cc: Carla Newark, MD Carla Caper, MD, <Dictator> Herbon E. Carla Ide, MD  Carla Caper MD ELECTRONICALLY SIGNED 07/15/2012 20:37

## 2014-11-04 NOTE — Discharge Summary (Signed)
PATIENT NAME:  Carla PrimasRASCOE, Siomara C MR#:  [id number] DATE OF BIRTH:  [birth year]  DATE OF ADMISSION:  02/11/2013  DATE OF DISCHARGE:  02/14/2013  PRESENTING COMPLAINT:  Shortness of breath and cough.   DISCHARGE DIAGNOSES: 1.  Right lower lobe pneumonia.  2.  Chronic obstructive pulmonary disease flare.  3.  Chronic respiratory failure, on home oxygen.  4.  End-stage chronic obstructive pulmonary disease.  5.  Ongoing tobacco abuse.  6.  Saturations 92% on 2 liters. 7.  Code status:  No code/DNR.  MEDICATIONS: 1.  Singulair 10 mg daily.  2.  Metformin 500 p.o. daily.  3.  Nexium 40 mg b.i.d.  4.  Zyrtec 10 mg daily.  5.  Aspirin 81 mg daily.  6.  Combivent 2 puffs 4 times a day.  7.  Advair 250/50, 1 puff b.i.d.  8.  Cefuroxime 500 mg b.i.d.  9.  Prednisone taper start with 50 mg, taper by 5 mg daily, and then continue chronic dose of 20 mg daily.  10.  Alprazolam 0.25 mg 1 tablet 8 hours as needed for anxiety.  11.  Doxycycline 100 mg every 12 hours b.i.d.  12.  Nystatin swish and swallow every 6 hours as needed.   INSTRUCTIONS:   Carbohydrate controlled diet.   Follow up with Dr. Meredeth IdeFleming in 1 to 2 weeks.   Follow up with Dr. Dale Durhamharlene [name] in 2 weeks.   Consider smoking cessation.   Use your oxygen as before.   REASON FOR ADMISSION:  [name] is an 81 year old Caucasian female with chronic respiratory failure, on home oxygen, with history of severe emphysema. Comes to the Emergency Room after she started having increasing shortness of breath and cough. She was started on p.o. cefuroxime by Dr. Meredeth IdeFleming as outpatient. However, patient continued to decline and came in with:   1.  Acute on chronic hypoxic respiratory failure. Continued IV steroids, changed to p.o. slow taper. She received IV Rocephin and doxycycline. Blood cultures remained negative. She was changed to p.o. cefuroxime, which she has a prescription already  along with doxycycline. She will follow up with Dr.  Meredeth IdeFleming as outpatient.  2.  COPD exacerbation in the setting of pneumonia. Continued her Advair, Singulair inhalers and oxygen.  3.  Type 2 diabetes, p.o. metformin resumed.  4.  Ongoing tobacco abuse. The patient understands  problem of her not able to quit. She is going to try again. Smoking cessation was advised.  5.  Anxiety. Xanax was described on as-needed basis.   Hospital stay otherwise remained stable. The patient remained a no code/DNR.    TIME SPENT:  40 minutes.    ____________________________ Wylie HailSona A. Allena KatzPatel, MD sap:mr D: 02/14/2013 11:06:27 ET T: 02/14/2013 20:37:06 ET JOB#: [id number]  cc: Mehkai Gallo A. Allena KatzPatel, MD, <Dictator> Herbon E. Meredeth IdeFleming, MD Dale Durhamharlene [name], MD   Willow OraSONA A Miliana Gangwer MD ELECTRONICALLY SIGNED 03/04/2013 7:29

## 2014-11-04 NOTE — H&P (Signed)
PATIENT NAME:  Carla Harris, Carla Harris MR#:  [id number] DATE OF BIRTH:  08/13/1928  DATE OF ADMISSION:  02/11/2013  PRIMARY CARE PHYSICIAN: Dale Geneva, MD   REFERRING PHYSICIAN: Daryel November, MD   CHIEF COMPLAINT: Shortness of breath.   HISTORY OF PRESENT ILLNESS: The patient is a pleasant 81 year old Caucasian female with history of bronchiectasis and chronic respiratory failure with oxygen nocturnally and also during the day as needed, diabetes, who presents for shortness of breath. The patient stated that she started to have congestion, rhinorrhea and some productive cough about a week ago and was started on a prednisone taper. She did not feel better and went back to her doctor and was given cefuroxime. That was done on Tuesday. The patient presented with significant shortness of breath, tachycardia, tachypnea and not improvement with outpatient therapy; and, therefore, hospitalist services were contacted for further evaluation and management.   PAST MEDICAL HISTORY: Chronic obstructive pulmonary disease, bronchiectasis, chronic respiratory failure on oxygen at 2 liters at home at bedtime and during the day as needed, hyperlipidemia, history of anemia, colon polyps, low IgG level, diabetes, recurrent UTIs.   PAST SURGICAL HISTORY: Appendectomy, hysterectomy, AAA repair, right rotator cuff repair.   FAMILY HISTORY: Mom with emphysema. Dad was healthy.   SOCIAL HISTORY: Smokes 1/2 pack a day, has been smoking since age 19.  No tobacco or alcohol use.  Son lives with the patient.   ALLERGIES: FOSAMAX, GUAIFENESIN, IBUPROFEN, LEVAQUIN, PENICILLIN, BUT SHE CAN TOLERATE CEPHALOSPORINS APPARENTLY.   OUTPATIENT MEDICATIONS: She is on a prednisone taper, Advair 250/50 mcg 1 puff 2 times a day, cefuroxime 500 mg 2 times a day, aspirin 81 mg daily, Combivent Respimat 100 mcg/20 mcg inhaled aerosol 2 puffs 4 times a day, metformin 500 mg once a day, Nexium 40 mg 2 times a day, Singulair 10 mg daily,  Zyrtec 10 mg once a day.   REVIEW OF SYSTEMS:  CONSTITUTIONAL: No fatigue, weakness, weight changes.  EYES: No blurry vision or double vision.  ENT: Some tinnitus, no hearing loss, positive for sinus congestion.  RESPIRATORY: Positive for a productive cough, clear-to-yellowish, with shortness of breath and increased dyspnea on exertion.  CARDIOVASCULAR: No chest pain, orthopnea, swelling in the legs or history of CHF.  GASTROINTESTINAL: No nausea, vomiting, diarrhea, constipation, abdominal pain.  GENITOURINARY: Denies dysuria or hematuria.  HEMATOLOGIC/LYMPHATIC: No anemia or easy bruising.  SKIN: No rashes.  MUSCULOSKELETAL: Denies arthritis or gout.  ENDOCRINE: Denies thyroid issues, has diabetes.  PSYCHIATRIC: No anxiety or depression.  NEUROLOGICAL: Denies focal weakness, numbness or seizures.   PHYSICAL EXAMINATION: VITAL SIGNS: Temperature on arrival 98.3, pulse rate 100, respiratory 32. Last respiratory rate of 24. Initial blood pressure 143/73, O2 sat 94% on oxygen.  GENERAL: The patient is a well-developed Caucasian female lying in bed in mild respiratory distress.  HEENT: Normocephalic, atraumatic. Pupils are equal and reactive. Anicteric sclerae. Extraocular muscles intact. Moist mucous membranes. Also, there is some oral thrush.  NECK: Supple. No thyroid tenderness. No cervical lymphadenopathy.  CARDIOVASCULAR: S1, S2, tachycardic. No murmurs, rubs or gallops.  LUNGS: There is significant wheezing bilaterally with some rhonchi in both lungs, scattered.  ABDOMEN: Soft, nontender, nondistended. Positive bowel sounds in all quadrants.  EXTREMITIES: No significant lower extremity edema.  NEUROLOGICAL: Cranial nerves II through XII are grossly intact. Strength is 5 out of 5 in all extremities. Sensation is intact to light touch.  SKIN: No obvious rashes.   LABORATORY AND RADIOLOGICAL DATA:  X-ray of the chest: Ill-defined increased  density in both lung bases. A follow-up PA and  lateral is suggested. There is hyperinflation consistent with COPD and atherosclerotic calcification.  EKG: Sinus with a PAC, rate is 95, no acute ST elevations or depressions.  Glucose 181. BNP is 243, BUN 8, creatinine 0.81, sodium 139, potassium 4.2, chloride 104, albumin 3.3, AST 10, ALT 17, total protein 6.2. Troponin negative. WBC 16.3, hemoglobin 14.8, platelets 189, INR 0.9.  ASSESSMENT: We have an 81 year old with chronic respiratory failure, chronic obstructive pulmonary disease, bronchiectasis, diabetes who comes in here for acute on chronic respiratory failure and outpatient failure of therapy.   PLAN: Would admit the patient to the hospital. I doubt the patient is having pneumonia, but she could be having a partially-treated pneumonia, but the patient comes in with tachycardia, tachypnea and some leukocytosis which could be from steroids at this point. Would admit the patient to the hospital, and as she has tolerated cephalosporins would resume ceftriaxone, and a dose has been given to her in the ER. I would start her on around-the-clock nebs while awake as well as IV steroids, oxygen, and repeat an x-ray of the chest, PA and lateral for better visualization tomorrow. I would continue the Singulair but hold the Advair and Combivent at this point. The patient might be having possible sepsis. She has some criteria for SIRS. Would obtain blood cultures, sputum cultures. I would continue her Singulair. I would check a hemoglobin A1c, start a sliding scale insulin. The patient still smokes tobacco, and she was counseled for 3 minutes, but I doubt she will stop smoking, and she refused a patch or nicotine oral inhaler.   TOTAL TIME SPENT: 50 minutes.   CODE STATUS: The patient is DNR.  ____________________________ Krystal EatonShayiq Mazell Aylesworth, MD sa:cb D: 02/11/2013 16:57:00 ET T: 02/11/2013 17:40:13 ET JOB#: [id number]  cc: Krystal EatonShayiq Vail Vuncannon, MD, <Dictator> Dale Durhamharlene [name], MD Krystal EatonSHAYIQ Tanaysha Alkins  MD ELECTRONICALLY SIGNED 02/25/2013 13:28

## 2014-11-05 NOTE — H&P (Signed)
PATIENT NAME:  Carla Harris, Carla Harris MR#:  [id number] DATE OF BIRTH:  Jun 12, 1929  DATE OF ADMISSION:  01/03/2014  PRIMARY CARE PHYSICIAN: Dr. Dale Durhamharlene [name].   PULMONOLOGIST: Dr. Meredeth IdeFleming.   CHIEF COMPLAINT: Shortness of breath.   HISTORY OF PRESENT ILLNESS: This is an 81 year old female who presents to the hospital with worsening shortness of breath for the past week. She describes that her chest feels tight when she takes a deep breath. Her shortness of breath has gotten significantly worse as even on minimal exertion or even at rest she gets chest tightness and shortness of breath. She admits to a cough which is intermittently productive, sometimes with yellow sputum. No fevers. No rigors, no chills. No nausea. No vomiting. No abdominal pain. She denies any recent sick contacts or any other upper respiratory symptoms. The patient presented to the hospital noted to be hypoxic with oxygen saturation in the mid- 80s on her home at 2 liters. She was noted to be in COPD exacerbation, placed on BiPAP. Hospitalist services were contacted for further treatment and evaluation.   REVIEW OF SYSTEMS:  CONSTITUTIONAL: No documented fever. No weight gain or weight loss.  EYES: No blurred or double vision.  ENT: No tinnitus. No postnasal drip. No redness of the oropharynx.  RESPIRATORY: Positive cough. Positive wheeze. No hemoptysis. Positive COPD.  CARDIOVASCULAR: Positive chest tightness. No orthopnea. No palpitations. No syncope.  GASTROINTESTINAL: No nausea, no vomiting, diarrhea, no abdominal pain. No melena or hematochezia.  GENITOURINARY: No dysuria, no hematuria.  ENDOCRINE: No polyuria or nocturia. No heat or cold intolerance.  HEMATOLOGIC: No anemia. No bruising. No bleeding.  INTEGUMENTARY: No rashes or lesions.  MUSCULOSKELETAL: No arthritis, no swelling, no gout.  NEUROLOGIC: No numbness or tingling. No ataxia. No seizure-type activity.  PSYCHIATRIC: No anxiety, no insomnia. No ADD.   PAST MEDICAL  HISTORY: Consistent with COPD on home oxygen, history of chronic atrial fibrillation, GERD, chronic pain, hypertension.   ALLERGIES: DOXYCYCLINE, FOSAMAX, GUAIFENESIN,  IBUPROFEN, LEVAQUIN, PENICILLIN.   SOCIAL HISTORY: Still smokes about five cigarettes per day, has been smoking for the past 40 to 50 years. No alcohol abuse. No illicit drug abuse. Lives at home with her husband.   FAMILY HISTORY: The patient's mother and father are both deceased. Mother died from complications of colon cancer. Father died from old age, he was [id number].   CURRENT MEDICATIONS: As follows: Advair 250/50 1 puff b.i.d., albuterol nebulizer every six hours as needed, aspirin 81 mg daily, Clobetasol  topical cream to be applied b.i.d., Combivent Respimat 1 puff q.6 hours, Cardizem CD 120 mg daily, dofetilide 250 mcg b.i.d., Glucophage 5 mg b.i.d., hydroxyzine 25 mg 4 times daily as needed, Nexium 40 mg daily, Percocet 5/325, 2 tabs q.6 hours as needed, prednisone 10 mg daily. Singulair 10 mg daily, theophylline 200 mg b.i.d., Zyrtec 10 mg daily and vitamin D3 2000 international units daily.   PHYSICAL EXAMINATION: Presently is as follows:  VITAL SIGNS: Are noted to be, temperature 98.9, pulse 76, respirations 25, blood pressure 119/60, sats 91% on BiPAP.  GENERAL: She is a pleasant -appearing female in mild to moderate respiratory distress.  HEAD, EYES, EARS, NOSE, THROAT: Atraumatic, normocephalic. Extraocular muscles are intact. Pupils equal and reactive to light. Sclerae anicteric. No conjunctival injection. No pharyngeal erythema.  NECK: Supple. There is no jugular venous distention. No bruits, no lymphadenopathy, no thyromegaly.  HEART: Regular rate and rhythm. No murmurs. No rubs, no clicks.  LUNGS: She has coarse rhonchi and wheezing diffusely. Positive  use of accessory muscles. No dullness to percussion.  ABDOMEN: Soft, flat, nontender, nondistended. Has good bowel sounds. No hepatosplenomegaly appreciated.   EXTREMITIES: No evidence of any cyanosis, clubbing, or peripheral edema. Has +2 pedal and radial pulses bilaterally.  NEUROLOGICAL: The patient is alert, awake, and oriented x 3 with no focal motor or sensory deficits appreciated bilaterally.  SKIN: Moist and warm with no rashes appreciated.   LYMPHATIC: There is no cervical or axillary lymphadenopathy.   LABORATORY DATA: Showed a serum glucose of 92, BUN 7, creatinine 0.8, sodium 137, potassium 3.8, chloride 103, bicarbonate 23. The patient's LFTs are within normal limits. Troponin less than 0.02. White cell count 12.5, hemoglobin 13.6, hematocrit 42.4, platelet count 240. Troponin less than 0.02. ABG showed a pH of 7.41, pCO2 34, pO2 of 49, sats 88% on nasal cannula. The patient did have a chest x-ray done which showed COPD with persistent small left pleural effusion and left basilar opacity, likely atelectasis. A left humeral neck fracture.   ASSESSMENT AND PLAN: This is an 80 year old female with a history of chronic obstructive pulmonary disease on home oxygen, history of chronic atrial fibrillation, gastroesophageal reflux disease, chronic pain, hypertension, presents to the hospital due to worsening shortness of breath for one week and noted to be in chronic obstructive pulmonary disease exacerbation.  1. Acute on chronic respiratory failure. This is likely secondary to chronic obstructive pulmonary disease exacerbation. The patient was initially placed on BiPAP, although we have weaned her off of that presently. I will place her on high flow nasal cannula. Follow her oxygen saturation and follow serial ABGs. Treat her underlying chronic obstructive pulmonary disease with IV steroids, around-the-clock nebulizer treatments. Continue Advair. Place on empiric ceftriaxone and Zithromax, follow sputum and blood cultures and follow her clinically. We will get a pulmonary consultation. The patient is well known to Dr. Meredeth Ide.  2. Chronic obstructive  pulmonary disease exacerbation. I suspect this is secondary to ongoing tobacco abuse. Chest x-ray is negative for any acute pneumonia. I will continue IV steroids high-dose. Continue around-the-clock nebulized treatment. Continue her Advair, Singulair and theophylline. We will give her empiric ceftriaxone and Zithromax, follow sputum cultures.  3. Gastroesophageal reflux disease. Continue Protonix. 4. Chronic atrial fibrillation. The patient is currently rate controlled. Continue her dofetilide and Cardizem. Continue aspirin.  5. Hypertension. The patient is presently hemodynamically stable. Continue her Cardizem.  6. Diabetes. Hold Glucophage and place her on sliding scale insulin for now.   CODE STATUS: The patient is a full code.   CRITICAL CARE TIME SPENT: 50 minutes.    ____________________________ Rolly Pancake. Cherlynn Kaiser, MD vjs:sg D: 01/03/2014 09:56:41 ET T: 01/03/2014 10:52:43 ET JOB#: [id number]  cc: Rolly Pancake. Cherlynn Kaiser, MD, <Dictator> Houston Siren MD ELECTRONICALLY SIGNED 01/11/2014 20:44

## 2014-11-05 NOTE — Discharge Summary (Signed)
PATIENT NAME:  Carla Harris, Carla Harris MR#:  [id number] DATE OF BIRTH:  Apr 11, 1929  DATE OF ADMISSION:  01/03/2014 DATE OF DISCHARGE:  01/07/2014   DISPOSITION: The patient is being discharged to [facility] long-term acute care.   DIAGNOSES: Presently, are as follows:  1. Acute on chronic respiratory failure secondary to end-stage chronic obstructive pulmonary disease.  2. Chronic obstructive pulmonary disease exacerbation.  3. Gastroesophageal reflux disease.  4. History of chronic atrial fibrillation.  5. Hypertension.  6. Diabetes.   DIET: The patient is being discharged on a low-sodium, low-fat, American Diabetic Association diet.   ACTIVITY: As tolerated.   DISCHARGE MEDICATIONS: As follows:  1. Tylenol 650 q.4 hours as needed.  2. Tylenol with oxycodone 5/325 two 2 tabs q.6 hours as needed. 3. Xanax 0.25 mg q.8 hours as needed.  4. Aspirin 81 mg daily.  5. Zyrtec 10 mg daily.  6. Vitamin D3 2000 international units daily.  7. Cardizem CD 120 mg daily.  8. Dofetilide, which is Tikosyn, 250 mcg b.i.d.  9. Advair 250 one puff b.i.d. 10. Insulin sliding scale.  11. Heparin subcutaneous 5000 units q.8 hours.  12. Solu-Medrol 60 mg q.6 hours. 13. Protonix 40 mg daily.  14. Theophylline 200 mg b.i.d. 15. Spiriva 1 puff daily.  16. Ceftriaxone 1 gram q.24 hours.  17. Doxycycline 100 mg b.i.d.  18. Metformin 500 mg b.i.d.  Also, the patient is on:  3519. DuoNebs q.4 hours continuous. 20. BiPAP at night. 21. Pulmicort nebulizers 0.5 mg SVN q.12 hours.   CONSULTANTS DURING THE HOSPITAL COURSE:  1. Dr. Harvie JuniorPhifer from palliative care. 2. Dr. Meredeth IdeFleming from pulmonary.   PERTINENT STUDIES DONE DURING THE HOSPITAL COURSE: As follows: A chest x-ray done on admission showing COPD with persistent small left pleural effusion and left basilar opacity, likely atelectasis. Left humeral neck fracture, which is chronic.   HOSPITAL COURSE: This is an 81 year old female who presented to the hospital  on 01/03/2014 secondary to shortness of breath, noted to be in acute on chronic respiratory failure secondary to COPD exacerbation.   1. Acute on chronic respiratory failure. This is acute on chronic hypoxic respiratory failure secondary to severe COPD and COPD exacerbation. The patient was initially on BiPAP in the Emergency Room, and eventually taken off the BiPAP, and currently remains on high-flow nasal cannula at 65%. The patient is also being treated aggressively for her COPD with IV steroids, around-the-clock nebulizer treatments on her Advair, Spiriva and theophylline. Her chest x-ray on admission did not show any acute pneumonia, but she has been empirically placed on ceftriaxone and doxycycline, tolerating it well. She still remains on high-flow at 65% FiO2.  2. COPD exacerbation. This is likely secondary to ongoing tobacco abuse. The patient is being aggressively treated with IV steroids, around-the-clock nebulizer treatments, Advair, Spiriva, Singulair, theophylline. She is slow to improve given her end-stage COPD. She has also been started on BiPAP at nights and Pulmicort nebulizers. Pulmonary has been following the patient, and they recommend current treatment along with weaning her high-flow nasal cannula as tolerated. Given the chronicity of her condition, the patient likely is a candidate for long-term acute care facility, which was discussed with the patient, and she is in agreement. Therefore, she is being discharged there for her chronic COPD and pulmonary condition presently.  3. GERD. The patient was maintained on Protonix. She will continue that. 4. History of chronic atrial fibrillation. The patient's rates have been somewhat labile given her underlying respiratory illness, although she  has been maintained on her Tikosyn and Cardizem. As far anticoagulation is concerned, she is just on aspirin.  5. Hypertension. The patient is presently hemodynamically stable. She is being maintained on  her Cardizem.  6. Diabetes. Her blood sugars are a little bit labile given the fact that she is on IV steroids. She is being maintained on metformin and sliding scale insulin for now.   DISPOSITION: The patient is a DNI, DNR, and is being transferred to a long-term acute care facility. She is in agreement, and the family is in agreement with this plan.   TIME SPENT ON DISCHARGE: 40 minutes.   ____________________________ Rolly Pancake. Cherlynn Kaiser, MD vjs:lb D: 01/07/2014 13:06:18 ET T: 01/07/2014 13:22:56 ET JOB#: [id number]  cc: Rolly Pancake. Cherlynn Kaiser, MD, <Dictator> [place] Siren MD ELECTRONICALLY SIGNED 01/11/2014 20:44

## 2014-11-05 NOTE — H&P (Signed)
PATIENT NAME:  Carla Harris, Carla Harris MR#:  [id number] DATE OF BIRTH:  1928/08/20  DATE OF ADMISSION:  07/14/2013  REFERRING PHYSICIAN:  Corliss Parish, MD   PRIMARY CARE PHYSICIAN: [facility] MD  HOSPITAL COURSE: Shortness of breath.   HISTORY OF PRESENT ILLNESS: This is a very nice 81 year old female who has a history of severe COPD with chronic respiratory failure. She also has bronchiectasis, diabetes, hypertension, peripheral vascular disease with a previous AAA repair.   The patient comes today complaining of cough that has been going on for over a week. The patient actually has been feeling not so well. She came up to the ER yesterday and the ER physician gave her some nebulizations and steroids. The patient felt great and was discharged home. The patient said that she was feeling very good and actually she thought that the therapy that was implemented here was great, but she started having some more shortness of breath this morning.   This morning, woke up having significant cough with yellow sputum, which is always like that. Actually the patient has daily cough, yellow in the morning, and it clears up as the day goes and the only thing is that she was having significant shortness of breath and she needed to pull for breath, as she describes. She was just pushing, pushing, pushing, and she could not make it  happen anymore and she was very scared for which she came to the Emergency Department.   She denies any significant fever. No tachycardia. No tachypnea. She just feels like it is really tight on her chest to push some air, but she does not feel any pain on her chest.   The patient is admitted for treatment and evaluation of COPD exacerbation.   REVIEW OF SYSTEMS:  CONSTITUTIONAL: Fever is negative and no weakness, weight loss or weight gain.  EYES: No blurry vision, double vision.  ENT:  No difficulty swallowing or tinnitus.  RESPIRATORY: Positive cough. Positive wheezing. Positive   COPD.  Positive dyspnea.  No positive respirations.  Negative hemoptysis.  CARDIOVASCULAR: No chest pain, orthopnea, or edema.  GASTROINTESTINAL: No nausea, vomiting, abdominal pain, constipation, diarrhea.  GENITOURINARY: No dysuria, hematuria, or changes in frequency.  GYNECOLOGIC: No breast masses.  ENDOCRINE: No polyuria, polydipsia, or polyphagia.  HEMOLYMPHATIC: No anemia or easy bruising.  SKIN: No rashes or petechiae.  MUSCULOSKELETAL: No significant joint pain. No joint effusions.  NEUROLOGIC: No numbness or tingling.  PSYCHIATRIC: No anxiety or depression.   PAST MEDICAL HISTORY:  1.  COPD.   2.  Chronic respiratory failure on 2 liters of oxygen at home.  3.  Bronchiectasis.  4.  Chronic anemia.  5.  Hyperlipidemia.  6.  Non-insulin-dependent diabetes.  7.  Recurring UTIs.  8.  Colon polyps.   ALLERGIES: DOXYCYCLINE, FOSAMAX, GUAIFENESIN, IBUPROFEN, LEVAQUIN, PENICILLIN.   PAST SURGICAL HISTORY:  1.  AAA repair.  2.  Rotator cuff repair.  3.  Hysterectomy.  4.  Appendectomy.     FAMILY HISTORY: Positive for her mom having emphysema. She was a heavy smoker. Her dad was actually healthy. She denies any cardiovascular disease or cancer in the family.   SOCIAL HISTORY: The patient has been smoking since the age of 71, somewhere in between half to 1 pack a day. Right now she is somewhere around 5 to 6. Smoking cessation counseling was given to the patient. The patient is still not ready to quit. She states that she is 21 and she can do whatever she  wants, but we insisted on smoking cessation and we talked about it for at least 4 minutes. The patient does not drink. She used to live by herself but her son moved into her house. She is retired.   MEDICATIONS: Zyrtec 10 mg daily, Singulair 10 mg daily, prednisone taper, Nexium 40 mg two times daily, Glucophage 500 mg twice daily, Combivent as needed for shortness of breath 4 times a day, azithromycin 250 mg once a day, aspirin 81  mg once a day, alprazolam 0.25 mg every 8 hours, Advair Diskus 250/50 twice daily.   PHYSICAL EXAMINATION:  VITAL SIGNS: Blood pressure 162/78, pulse 103, respirations 19, temperature 98.5, pulse ox 92% on 3 liters nasal cannula. The patient usually wears 2 liters nasal cannula .  HEENT: Her pupils are equal and reactive. Extraocular movements are intact. Mucosae are moist. Anicteric sclerae. Pink conjunctivae. No oral lesions. No oropharyngeal exudates.  NECK: Supple. No JVD. No thyromegaly. No adenopathy. No carotid bruits appreciated at this moment. No rigidity.  CARDIOVASCULAR: Regular rate and rhythm. Slightly tachycardic. No murmurs, rubs, or gallops are appreciated.  LUNGS: Significant wheezing and rhonchi diffuse all over respiratory fields with decreased air entrance, especially in the bases. No use of accessory muscles at this moment. There is no dullness to percussion.  ABDOMEN: Soft, nontender, nondistended. No hepatosplenomegaly. No masses. Bowel sounds are positive.  EXTREMITIES: No edema, tenderness or clubbing. Pulses +2. Capillary refill less than 3.  NEUROLOGIC: Cranial nerves II through XII intact. Strength is 5/5 in all four extremities. No focal findings.  PSYCHIATRIC: No significant agitation. The patient is alert, oriented x3.  LYMPHATIC: Negative for lymphadenopathy in the neck or supraclavicular areas.  MUSCULOSKELETAL: No significant joint deformity or joint swelling.  LYMPHATIC: Negative for lymphadenopathy or supraclavicular areas.   Her heart rate has been up to 100 and her respiratory rate up to 28.  By the time I saw her, all these vitals have improved.   LABORATORY DATA: Blood sugar 243, sodium 138, potassium 4.2, troponin 0.02. White count is 14,000. Yesterday it was 9.8. This is secondary to steroid use. Influenza is negative.   EKG: Normal sinus rhythm with no ST depression or elevation.   ASSESSMENT AND PLAN: This is a very nice 81 year old female with  history of chronic obstructive pulmonary disease, chronic respiratory failure, diabetes, urinary tract infections, anemia, hyperlipidemia, who comes with a history of worsening shortness of breath, cough, and wheezing.  1.  Chronic obstructive pulmonary disease exacerbation. The patient comes with a diagnosis of chronic obstructive pulmonary disease exacerbation. She is requiring a little bit more oxygen. She has acute-on-chronic respiratory failure. Oxygen saturation is documented up to 92 on 3 liters. We do not have any oxygen saturations documented on room air or on 2 liters. The patient is given extra oxygen due to the fact that she was desaturating but I do not have the documentation to justify that. The patient has significant decrease of her air entrance. She was using accessory muscles prior as I saw her, but once I saw her, she was status post nebulizers and she looks better. Her heart rate is coming down. It was up to 120. Her respiratory rate is now 19. Prior to that it was up to 28. The patient is overall doing a little bit better. Continue nebulizers every 4 hours, continue steroids 40 mg every 6 hours. Continue Advair and Spiriva. The patient is a smoker. Smoking cessation counseling given to the patient for over 4 minutes. The  patient says that at this age of 56, she can do whatever she wants, and she wants to continue smoking.  2.  Her white count is elevated due to the steroid use.  3.  Her diabetes is out of control due to her use of steroids. At this moment, we are going to continue metformin, adjust metformin as necessary. The patient placed on diabetic diet, insulin sliding scale.  4.  Deep vein thrombosis prophylaxis with Lovenox.  5.  Gastrointestinal prophylaxis with Protonix.   TIME SPENT: About 50 minutes with this patient.   CODE STATUS: FULL CODE.   ____________________________ Felipa Furnace, MD rsg:np D: 07/14/2013 16:31:25 ET T: 07/14/2013 17:06:12  ET JOB#: [id number]  cc: Felipa Furnace, MD, <Dictator> Berdine Rasmusson Juanda Chance MD ELECTRONICALLY SIGNED 08/08/2013 8:00

## 2014-11-05 NOTE — H&P (Signed)
PATIENT NAME:  Antoine PrimasRASCOE, Scherry C MR#:  [id number] DATE OF BIRTH:  05-May-1929  DATE OF ADMISSION:  09/13/2013  PRIMARY CARE PROVIDER: Dale Durhamharlene [name], MD  EMERGENCY DEPARTMENT REFERRING PHYSICIAN: Randon GoldsmithRebecca L. Lord, MD  CHIEF COMPLAINT: Shortness of breath.   HISTORY OF PRESENT ILLNESS: The patient is an 81 year old white female who has history of COPD, chronic respiratory failure on 2 liters of oxygen, who has a history of diabetes, hypertension, peripheral vascular disease, AAA repair, who presents to the ED with shortness of breath that started since Friday, The patient reports that she is progressively worse. Even with her wearing her oxygen 2 liters, she has still been short of breath and has had productive yellow cough of yellow sputum. She has not had any fevers or chills. She does have chest pain with coughing. She denies any nausea, vomiting, or diarrhea. Denies any urinary frequency, urgency, or hesitancy.   PAST MEDICAL HISTORY: Significant for:  1.  COPD, chronic respiratory failure on 2 liters of O2 at home, history of bronchiectasis.  2.  Chronic anemia.  3.  Hyperlipidemia.  4.  Non-insulin-dependent diabetes.  5.  Recurrent UTIs.  6.  History of colonic polyp.   ALLERGIES: DOXYCYCLINE, FOSAMAX, GUAIFENESIN, IBUPROFEN, LEVAQUIN, AND PENICILLIN.   PAST SURGICAL HISTORY: AAA repair, rotator cuff repair, hysterectomy, appendectomy.  FAMILY HISTORY: Positive for mother having emphysema.   SOCIAL HISTORY: The patient continues to smoke, according to her a few cigarettes per day. She denies any alcohol or drug use.   CURRENT MEDICATIONS AT HOME: She is on prednisone 10 mg daily, Advair 250/50 one puff b.i.d., albuterol nebulizers q.6 hours, aspirin 81 mg 1 tab p.o. daily, Combivent 100/20 one puff q.6 hours, Glucophage 500 mg 1 tab p.o. b.i.d., Nexium 40 mg 1 tab p.o. daily, prednisone 10 daily, Singular 10 daily, theophylline 200 mg 1 tab p.o. b.i.d., vitamin D3 at 2000 international  units daily, Zyrtec 10 daily.  REVIEW OF SYSTEMS:    CONSTITUTIONAL: Denies any fevers. Complains of chronic fatigue, weakness. No pain. No weight loss. No weight gain.  EYES: No blurred or double vision. No pain. No redness. No inflammation. No glaucoma or cataracts.  EARS, NOSE, THROAT: No tinnitus. No ear pain. No hearing loss. No seasonal or year-round allergies. No epistaxis. No nasal discharge. No difficulty swallowing.  RESPIRATORY: Complains of cough, wheezing. Has COPD.  CARDIOVASCULAR: Complains of chest pain with coughing. No orthopnea. No edema. No arrhythmia. Complains of dyspnea on exertion. No palpitation. No syncope.  GASTROINTESTINAL: No nausea, vomiting, diarrhea. No abdominal pain. No hematemesis. No melena. No ulcer.  GENITOURINARY: Denies any dysuria, hematuria, renal calculus or frequency.  ENDOCRINE: Denies any polyuria, nocturia or thyroid problems.  HEMATOLOGIC AND LYMPHATIC: Denies anemia, easy bruisability or bleeding.  SKIN: No acne. Complains of some scaly rash on her feet. She also complains of her nails kind of being flaky.  MUSCULOSKELETAL: Denies any pain in the neck, back or shoulder.  NEUROLOGIC: No numbness, CVA, TIA.  PSYCHIATRIC: No anxiety, insomnia or ADD.   PHYSICAL EXAMINATION: VITAL SIGNS: Temperature 98, pulse 105, respirations 28, blood pressure 126/63, O2 sat 89%.  GENERAL: The patient is a thin Caucasian female in no acute distress.  HEENT: Head atraumatic, normocephalic. Pupils equally round, reactive to light and accommodation. There is no conjunctival pillar. No scleral icterus. Nasal exam shows no drainage or ulceration. Oropharynx is clear without any exudate.  NECK: Supple without any JVD.  CARDIOVASCULAR: Regular rate and rhythm. No murmurs, rubs, clicks, or  gallops.  LUNGS: She has some accessory muscle usage. Has bilateral wheezing but no rhonchi.  ABDOMEN: Soft, nontender, nondistended. Positive bowel sounds x 4.  EXTREMITIES: She has  diminished DP and PT pulses in both lower extremities.  SKIN: She does have some dryness of the skin.  LYMPHATICS: No lymph nodes palpable.  VASCULAR: Good DP, PT pulses.  PSYCHIATRIC: Not anxious or depressed.  NEUROLOGIC: Awake, alert, oriented x 3. No focal deficits.   LABORATORY AND RADIOLOGICAL DATA: Glucose 191, BUN 8, creatinine 0.71, sodium 138, potassium 3.7, chloride 107, CO2 of 24, calcium 9.1. Troponin less than 0.02. WBC 14.3, hemoglobin 13.9, platelet count 186. Urinalysis shows leukocytes 3+. Chest x-ray shows small left effusion, bibasilar atelectasis.   ASSESSMENT AND PLAN: The patient is an 81 year old white female with history of chronic obstructive pulmonary disease, chronic respiratory failure, presents with shortness of breath.  1.  Acute on chronic respiratory failure due to chronic obstructive pulmonary disease exacerbation. At this time, will treat her with IV Solu-Medrol. Place her on nebulizers. Also treat her with IV ceftriaxone and azithromycin in light of likely acute bronchitis. Will continue her Advair and theophylline as taking at home.  2.  Diabetes. Continue Glucophage. Will add sliding scale to her insulin.  3.  Lower extremity rash with poor pulses. Will get Dopplers of her lower extremity, ankle brachial index if possible.  4.  Gastroesophageal reflux disease. Will continue Nexium. 5.  Nicotine addiction. The patient was counseled regarding smoking cessation. I recommended she stop smoking. The patient has tried to stop in the past. Will try a nicotine patch again. Three minutes spent on counseling regarding smoking cessation. 6.  Miscellaneous: Will do Lovenox for deep vein thrombosis prophylaxis.  TIME SPENT: 50 minutes on this patient.    ____________________________ Lacie Scotts. Allena Katz, MD shp:jcm D: 09/13/2013 13:21:48 ET T: 09/13/2013 15:17:46 ET JOB#: [id number]  cc: Kingslee Dowse H. Allena Katz, MD, <Dictator> Charise Carwin MD ELECTRONICALLY SIGNED  09/21/2013 15:33

## 2014-11-05 NOTE — Discharge Summary (Signed)
PATIENT NAME:  Carla Harris, Carla Harris MR#:  [id number] DATE OF BIRTH:  01-May-1929  DATE OF ADMISSION:  09/13/2013 DATE OF DISCHARGE:  09/19/2013  ADMITTING DIAGNOSIS: Chronic obstructive pulmonary disease exacerbation.   DISCHARGE DIAGNOSES: 1.  Acute on chronic respiratory failure due to chronic obstructive pulmonary disease exacerbation.  2.  Acute bronchitis, pseudomonas.  3.  Atrial fibrillation with rapid ventricular response, back to sinus rhythm now. 4.  Diabetes mellitus. 5.  Gastroesophageal reflux disease. 6.  Ongoing tobacco abuse.   DISCHARGE CONDITION: Stable.   DISCHARGE MEDICATIONS: The patient is to continue: 1.  Singulair 10 mg p.o. daily. 2.  Zyrtec 10 mg p.o. daily. 3.  Advair Diskus 250/50 twice daily.  4.  Glucophage 500 mg p.o. twice daily. 5.  Aspirin 81 mg p.o. daily. 6.  Nexium 40 mg p.o. daily. 7.  Theophylline 200 mg p.o. twice daily.  8.  Vitamin D3 2,000 units once daily. 9.  Albuterol nebulizers 3 mL every 6 hours. 10.  Combivent Respimat 1 puff every 6 hours. 11.  Prednisone taper 60 mg p.o. tomorrow, on the 9th of March 2015, then taper x 10 mg until stopped. The patient is to resume prednisone at 10 mg daily dose after she finishes tapering dose. 12.  Z-Pak 1 tablet once daily. 13.  Dofetilide 250 mcg p.o. twice daily. 14.  Diltiazem extended release 120 mg p.o. daily. 15.  Guaifenesin 600 mg p.o. twice daily. 16.  Nicotine transdermal patch 21 mg topically once daily. 17.  Tussionex 5 mL twice daily as needed.   Of note, the patient's Zithromax was changed to ciprofloxacin 750 mg p.o. twice daily dose. The patient was advised to take very little dose of ciprofloxacin initially with Benadryl since she had reported allergies or itching to levofloxacin and observe the course. If, however, she develops any itching, she is to call back to the hospital for advice. This was communicated to the patient's pharmacy, which [facility]. The patient is not to  take Zithromax.   HOME OXYGEN: With portable tank at 2 liters of oxygen through nasal cannula.   DIET: 1800 ADA, low fat, low cholesterol, and 2 gram salt.  ACTIVITY LIMITATIONS: As tolerated.  FOLLOWUP APPOINTMENT: With Dr. Dale Durhamharlene [name] in 2 days after discharge, Dr. Meredeth IdeFleming in 1 week after discharge, Dr. Adrian BlackwaterShaukat Khan in 1 week after discharge.  CONSULTANTS: Dr. Meredeth IdeFleming, care management, social work, Dr. Adrian BlackwaterShaukat Khan.   RADIOLOGIC STUDIES: Chest x-ray, portable single view, 2nd of March 2015, revealed small left effusion with basilar atelectasis.   HISTORY AND HOSPITAL COURSE: The patient is an 81 year old Caucasian female with past medical history significant for history of chronic respiratory failure, on oxygen therapy at home, who is a smoker with ongoing tobacco abuse, presented to the hospital with complaints of shortness of breath, wheezing, cough. Please refer to Dr. Serita GritShreyang Patel's admission note on the 2nd of March 2015. The patient admitted to productive cough with yellow sputum. She had not had fevers or chills. She also was admitting of chest pain with coughing.   On arrival to the hospital, the patient's vital signs, temperature was 98, pulse was 105, respiration rate was 28, and blood pressure 126/63. O2 sats were 89% on oxygen therapy. The patient did have use of accessory muscles to breathe, also bilateral wheezing but no rhonchi.  Her lab data on arrival to the hospital, on the 2nd of March 2015, revealed elevated of glucose to 191. BMP otherwise was unremarkable. The patient's  troponin was less than 0.02. Calcium level was 5.3. White blood cell count was elevated to 14.2, hemoglobin was 13.9, and platelet count 186. Absolute neutrophil count was not checked on admission. Blood cultures taken on the 2nd of March 2015 showed no growth. Urinalysis was remarkable for 3+ leukocyte esterase and 8 white blood cells, otherwise no abnormalities. The patient's ABGs were performed, on  32% FiO2, and revealed pH of 7.35, pCO2 was 31, pO2 was 63, and saturation was 93.4%. Lactic acid level was elevated at 6.2.   EKG revealed sinus tach at 103 beats per minute, otherwise normal EKG. No acute ST-T changes were noted.   Chest x-ray was remarkable for bilateral atelectasis.   HOSPITAL COURSE: The patient was admitted to the hospital for further evaluation. She was initiated on Rocephin as well as Zithromax as well as steroids IV, nebulizers, and inhalers, and her condition slowly improved. Her lungs slowly improved, and she did progressively better. Attempt to collect sputum finally resulted in success. On the 7th of March 2015, the patient's sputum was collected and it reported pseudomonas. Initially, prior to leaving the hospital; however, we did not know those results so we discharged the patient just on Zithromax since she was clinically improving; however, upon finding out her true sputum cultures, Pseudomonas, we called the patient's pharmacy, [facility], and discussed the patient's antibiotic, and Zithromax should be changed to high dose of ciprofloxacin. We ordered ciprofloxacin to be dated 750 mg p.o. twice daily dose for 10 days. However, the pharmacy reported the patient had allergies to Levofloxacin, possibly itching around injection site, when the patient received levofloxacin IV in the hospital. However, we felt that this is the only antibiotic the patient will be able to use orally for pseudomonas infection and we felt strongly that she needs to try ciprofloxacin. We asked the pharmacy to relay that the patient should take half dose of antibiotic, as a trial dose, with Benadryl, and see how it works for her. If in fact she does not have any symptoms, she is to continue taking this antibiotic. She is to follow up with her primary care physician, Dr. Dale Helper, as well as her pulmonologist in the next few days after discharge. It is recommended to follow the patient's QTc  as soon as the patient is seen by either physician since the patient is on medication for episode of atrial fibrillation while she was in the hospital and ciprofloxacin. Both of those medications could prolong her QTc interval and lead her to torsades. Although the patient was improving in regards to her COPD exacerbation, she suddenly developed atrial fibrillation, RVR. She was briefly admitted to intensive care unit for IV Cardizem drip with which the patient's heart rate slowed down and she in fact was able to convert into sinus rhythm with the medication, which is called dofetilide. Dr. Adrian Blackwater consulted the patient while she was in the hospital and felt that this particular medication, which is called Tikosyn, will be preferable in view of chronic lung disease since it does not cause pulmonary fibrosis. The patient was also keeping QTc below 470 milliseconds and have periodic EKG evaluations to make sure that the potassium as well as magnesium levels are kept normal. With this medication, as well as Cardizem, the patient converted into sinus rhythm after short period of atrial fibrillation. Dr. Adrian Blackwater, however, felt that the patient may benefit from anticoagulation with Coumadin or Xarelto or Eliquis. We felt however that the patient is  multiple medications at this time and this anticoagulation should be discussed with the patient and by cardiologist and make decision about initiation of anticoagulation as outpatient. Moreover, she was in sinus rhythm, as mentioned above, on the day of discharge.   In regards to diabetes mellitus and gastroesophageal reflux disease, the patient is to continue her outpatient management.   For ongoing tobacco abuse, the patient was counseled and recommended to quit. She was initiated on nicotine replacement therapy and she did quite well with this.  She is being discharged in stable condition with the above-mentioned medications and follow-up. Her vital signs on  the day of discharge: Temperature was 97.3, pulse was 88 to 102, respiration rate was 20, and blood pressure ranging to 141/70s diastolic and O2 sats were 92% on 2 liters of oxygen through nasal cannula at rest.   Of note, echocardiogram was performed on the 6th of March 2015 and read by Dr. Adrian Blackwater which revealed left ventricular ejection fraction by visual estimation 65% to 70%. No thrombi in cardiac chambers was seen. Decreased left ventricular internal cavity size as well as moderately enlarged right ventricle, mildly dilated right atrium, and mild mitral valve regurgitation was noted. No other acute abnormalities were seen.  Again, the patient is being discharged in stable condition with the above-mentioned medications and followup. It is recommended to follow the patient's potassium and magnesium levels as well as her EKG. QTc should be followed, especially since she is on Tikosyn and now she is on ciprofloxacin for her pseudomonas bacterial infection.   TIME SPENT: 40 minutes. ____________________________ Katharina Caper, MD rv:sb D: 09/19/2013 19:23:13 ET T: 09/20/2013 07:26:54 ET JOB#: [id number]  cc: Katharina Caper, MD, <Dictator> Dale Stonewall, MD Herbon E. Meredeth Ide, MD Laurier Nancy, MD Amara Justen Winona Legato MD ELECTRONICALLY SIGNED 10/13/2013 21:57

## 2014-11-05 NOTE — Discharge Summary (Signed)
PATIENT NAME:  Carla Harris, Carla Harris MR#:  [id number] DATE OF BIRTH:  1929-01-20  DATE OF ADMISSION:  07/14/2013 DATE OF DISCHARGE:  07/19/2013  REASON FOR ADMISSION: Shortness of breath.   DISCHARGE DIAGNOSES: 1.  Chronic obstructive pulmonary disease exacerbation.  2.  Chest pain secondary to musculoskeletal pain due to cough.  3.  Supraventricular tachycardia, resolved.  4.  Chronic obstructive pulmonary disease.  5.  Chronic respiratory failure on 2 liters of oxygen at home.  6.  Systemic inflammatory response syndrome due to chronic obstructive pulmonary disease.  7.  Diabetes, non-insulin-dependent.  8.  Hypotension, relative, now improved.   MEDICATIONS AT DISCHARGE: Singulair 10 mg once a day, Nexium 40 mg twice daily, Zyrtec 10 mg once daily, aspirin 81 mg daily, Advair Diskus 250/50 twice daily, Apresoline 0.25 mg every 8 hours, Glucophage 500 mg twice daily, Combivent 1 puff 4 times a day as needed for shortness of breath, prednisone tapered down, decreased 10 mg every day, starting at 60; hydrocodone with Tylenol 325/5 every 4 hours as needed for pain, amoxicillin with clavulanate 875/125 twice daily for 5 days, Spiriva 18 mcg once a day.   Follow up with Dale Big Beaver within the next 7 days to 14 days.    HOSPITAL COURSE: This is a very nice 81 year old female with history of chronic obstructive pulmonary disease, chronic respiratory failure, bronchiectasis, diabetes, hypertension, peripheral vascular disease and previous abdominal aortic aneurysm repair.   The patient came with a complaint of cough that was going on over a week. She showed up to the Emergency Department the night before, got some steroids, nebulizers, and went home. Then, she came back the next morning having worsening shortness of breath.   The patient was admitted for COPD exacerbation. She was treated with steroids IV, oxygen was increased to 2 liters. She continued with nebulizers.   The patient did well during  this hospitalization except for the day of January 3rd. She had an episode where she was trying to get out of the bed and turned. She felt her sternum pop, started having chest pain, and her heart rate went to the 160s.   An EKG showed on the monitor regular heart rate with possible SVT. The patient was given some morphine for the pain and started improving rapidly. Her heart rate came down to 70s, and she was just loaded with digoxin in case this episode repeated. Most likely this episode was just created for the trauma of musculoskeletal, where her sternum just popped really hard on her chest while turning. The patient is debilitated, and she has significant osteoporosis due to her heavy tobacco use, and at that moment, there were not any signs of fractures, but the patient was with significant pain. After the episode resolved, her heart rate remained stable without any further exacerbations. The patient had telemetry monitoring, and her heart rate remained around the 70s.   As far as her tobacco abuse, the patient was counseled extensively she needed to quit smoking. As far as her diabetes, it was up out of control due to steroids. The patient does not want to go home on an insulin sliding scale. She preferred to continue her metformin.   She had systemic inflammatory response syndrome. There were no signs of pneumonia on her chest x-ray. No signs of any infections. No new infiltrates, and it was likely secondary to her COPD exacerbation.   The patient did well. The patient is going to be discharged home in good condition to  follow up with Dale Durhamharlene [name].     I spent about 45 minutes with this discharge.     ____________________________ Felipa Furnaceoberto Sanchez Gutierrez, MD rsg:dmm D: 07/19/2013 16:45:57 ET T: 07/19/2013 19:27:32 ET JOB#: [id number]  cc: Felipa Furnaceoberto Sanchez Gutierrez, MD, <Dictator> Dale Durhamharlene [name], MD Pearletha FurlOBERTO SANCHEZ GUTIERRE MD ELECTRONICALLY SIGNED 08/08/2013 8:02

## 2014-11-05 NOTE — Discharge Summary (Signed)
PATIENT NAME:  Carla PrimasRASCOE, Krissy C MR#:  [id number] DATE OF BIRTH:  [birth year]  DATE OF ADMISSION:  01/03/2014.  DATE OF DISCHARGE:  01/07/2014.  ADDENDUM:   The patient over the weekend did well. She was switched from Tikosyn antiarrhythmic to sotalol 80 mg b.i.d. Cardiology was curbsided for the same. The patient is tolerating sotalol well. She is improving clinically, slowly as far as her end-stage emphysema/COPD. We will keep her on slow-dose taper of prednisone. She completed her empiric antibiotics. She is currently on high-flow nasal cannula on 40% FiO2.   The patient will be [place] [facility] [place] once a bed opens up. The family has been updated. The patient is otherwise hemodynamically stable at this time.   CODE STATUS:  The patient remains a No Code, DNR.   FINAL DISCHARGE MEDICATIONS: 1.  Tylenol 650 q.4 hours p.r.n.  2.  Aspirin 81 mg daily.  3.  Tylox 5/325, 2 pills q.6 hours p.r.n.  4.  Cardizem CD 120 mg daily.  5.  Cetirizine hydrochloride 10 mg daily.  6.  Advair 250/50 1 puff b.i.d.  7.  Theophylline 200 mg b.i.d.  8.  Protonix 40 mg daily.  9.  Vitamin D 2000 International Units p.o. daily.  10. Insulin sliding scale.  11. Alprazolam 0.25 q.8 hours p.r.n.  12. Spiriva 1 capsule inhalation daily.  13. Glucophage 500 mg b.i.d.  14. Sotalol 80 mg b.i.d.  15. Combivent Respimat 1 puff q.i.d.  16. Singulair 10 mg daily.  17. Prednisone 60 mg daily, taper by 10 mg daily then continue 10 mg daily indefinitely.  TIME SPENT: 40 minutes.    ____________________________ Wylie HailSona A. Allena KatzPatel, MD sap:lt D: 01/10/2014 09:20:35 ET T: 01/10/2014 11:03:35 ET JOB#: [id number]  cc: Sona A. Allena KatzPatel, MD, <Dictator>

## 2014-11-05 NOTE — Consult Note (Signed)
PATIENT NAME:  Carla Harris, Carla Harris MR#:  [id number] DATE OF BIRTH:  07/28/1928  DATE OF CONSULTATION:    REFERRING PHYSICIAN:   CONSULTING PHYSICIAN:  Laurier NancyShaukat A. Khan, MD  INDICATION FOR CONSULTATION: Chest pain and atrial fibrillation.   HISTORY OF PRESENT ILLNESS: This is an 81 year old white female with a past medical history of COPD, chronic respiratory failure on home oxygen 2 L, AAA repair, hypertension, peripheral vascular disease and diabetes, who came into the hospital with shortness of breath and was being treated by Dr. Meredeth IdeFleming and Dr. Imogene Burnhen for COPD exacerbation. However, this morning,  she started having chest pain, palpitations and fluttering and was found to be in atrial fibrillation with rapid ventricular response rate.   PAST MEDICAL HISTORY: As mentioned above.   ALLERGIES: DOXYCYCLINE, FOSAMAX, IBUPROFEN, LEVAQUIN AND PENICILLIN.   FAMILY HISTORY: Positive for emphysema.   SOCIAL HISTORY: Continued to smoke a few cigarettes a day.   PHYSICAL EXAMINATION:  GENERAL: She is alert, oriented x 3 in mild distress due to shortness of breath.  VITAL SIGNS: Blood pressure is 108/70, respirations 25, pulse is 110 and saturation is 91.  NECK: No JVD.  LUNGS: There is wheezing bilaterally.  HEART: Irregularly irregular pulse. Normal S1, S2. No audible murmur.  ABDOMEN: Soft, nontender, positive bowel sounds.  EXTREMITIES: No pedal edema.  NEUROLOGIC: She appears to be intact.   EKG shows atrial fibrillation at 154 beats per minute, nonspecific ST-T changes. BUN is 8, creatinine 0.71, hemoglobin 13.9.   ASSESSMENT AND PLAN: Atrial fibrillation with rapid ventricular response rate. We will start the patient on Tikosyn. We will do period EKGs and also continue Cardizem drip because Zithromax interferes with QT interval. We will discontinue that and maybe find alternative for Zithromax. We will follow with you. We will also get an echocardiogram and look at ejection fraction. Thank you  very much for the referral. I agree with giving Lovenox.  ____________________________ Laurier NancyShaukat A. Khan, MD sak:aw D: 09/17/2013 08:47:07 ET T: 09/17/2013 09:19:45 ET JOB#: [id number]  cc: Laurier NancyShaukat A. Khan, MD, <Dictator> Laurier NancySHAUKAT A KHAN MD ELECTRONICALLY SIGNED 09/27/2013 12:13

## 2014-11-05 NOTE — Consult Note (Signed)
PATIENT NAME:  Carla PrimasRASCOE, Yoshiko C MR#:  [id number] DATE OF BIRTH:  04/07/1929  CARDIOLOGY CONSULTATION   DATE OF CONSULTATION:  09/17/2013  CONSULTING PHYSICIAN:  Laurier NancyShaukat A. Malaijah Houchen, MD  INDICATION FOR CONSULTATION: Chest pain and atrial fibrillation.   HISTORY OF PRESENT ILLNESS: This is an 81 year old white female with a past medical history of COPD, chronic respiratory failure, on home oxygen 2 liters, AAA repair, hypertension, peripheral vascular disease and diabetes, who came into the hospital with shortness of breath. Was being treated by Dr. Meredeth IdeFleming and Dr. Imogene Burnhen for COPD exacerbation. However, this morning, she started having chest pain, palpitations and fluttering and was found to be in atrial fibrillation with rapid ventricular response rate.   PAST MEDICAL HISTORY: As mentioned above.   ALLERGIES: DOXYCYCLINE, FOSAMAX, IBUPROFEN, LEVAQUIN AND PENICILLIN.   FAMILY HISTORY: Positive for emphysema.   SOCIAL HISTORY: Continues to smoke a few cigarettes a day.   PHYSICAL EXAMINATION:  GENERAL: She is alert, oriented x3, in mild distress due to shortness of breath.  VITAL SIGNS: Her blood pressure is 108/70, respirations are 25, pulse is 110 and saturation is 91.  NECK: Revealed no JVD.  LUNGS: There is wheezing bilaterally.  HEART: Irregularly irregular pulse. Normal S1, S2. No audible murmur.  ABDOMEN: Soft, nontender, positive bowel sounds.  EXTREMITIES: No pedal edema.  NEUROLOGICAL: She appears to be intact.   DIAGNOSTIC DATA: EKG shows atrial fibrillation, 154 beats per minute, nonspecific ST-T changes. BUN is 8, creatinine 0.71. Hemoglobin is 13.9.   ASSESSMENT AND PLAN: Atrial fibrillation with rapid ventricular response rate. Will start the patient on Tikosyn, do periodic EKGs and also continue Cardizem drip. Because Zithromax interferes with QT interval, will discontinue that, maybe find alternative for Zithromax. Will follow with you. Also get an echocardiogram and look at  ejection fraction.   Thank you very much for referral. Agree with giving Lovenox.   ____________________________ Laurier NancyShaukat A. Melenda Bielak, MD sak:lb D: 09/17/2013 08:47:07 ET T: 09/17/2013 09:18:17 ET JOB#: [id number]  cc: Laurier NancyShaukat A. Aundra Espin, MD, <Dictator> Laurier NancySHAUKAT A Hue Frick MD ELECTRONICALLY SIGNED 09/27/2013 12:13

## 2014-11-05 NOTE — Discharge Summary (Signed)
PATIENT NAME:  Carla Harris, Carla Harris MR#:  [id number] DATE OF BIRTH:  1928/09/11  DATE OF ADMISSION:  01/03/2014 DATE OF DISCHARGE:  01/10/2014  ADDENDUM:   Please see the Discharge Summary dictated by Dr. Cherlynn KaiserSainani on 01/07/2014.  The patient over the weekend did well. She was switched from Tikosyn antiarrhythmic to sotalol 80 mg b.i.d. Cardiology was curbsided for the same. The patient is tolerating sotalol well. She is improving clinically, slowly as far as her end-stage emphysema/COPD. We will keep her on slow-dose taper of prednisone. She completed her empiric antibiotics. She is currently on high-flow nasal cannula on 40% FiO2.   The patient will [place] [facility] [place] once a bed opens up. The family has been updated. The patient is otherwise hemodynamically stable at this time.   CODE STATUS:  The patient remains a NO CODE, DNR.   FINAL DISCHARGE MEDICATIONS: 1.  Tylenol 650 q.4 hours p.r.n.  2.  Aspirin 81 mg daily.  3.  Tylox 5/325, 2 pills q.6 hours p.r.n.  4.  Cardizem CD 120 mg daily.  5.  Cetirizine hydrochloride 10 mg daily.  6.  Advair 250/50 1 puff b.i.d.  7.  Theophylline 200 mg b.i.d.  8.  Protonix 40 mg daily.  9.  Vitamin D 2000 International Units p.o. daily.  10. Insulin sliding scale.  11. Alprazolam 0.25 q.8 hours p.r.n.  12. Spiriva 1 capsule inhalation daily.  13. Glucophage 500 mg b.i.d.  14. Sotalol 80 mg b.i.d.  15. Combivent Respimat 1 puff q.i.d.  16. Singulair 10 mg daily.  17. Prednisone 60 mg daily, taper by 10 mg daily then continue 10 mg daily indefinitely.  TIME SPENT: 40 minutes.    ____________________________ Wylie HailSona A. Allena KatzPatel, MD sap:lt D: 01/10/2014 09:20:00 ET T: 01/10/2014 11:03:35 ET JOB#: [id number]  cc: Sona A. Allena KatzPatel, MD, <Dictator> Willow OraSONA A PATEL MD ELECTRONICALLY SIGNED 01/15/2014 10:32

## 2014-11-05 NOTE — Consult Note (Signed)
   Comments   Jeneen Rinks, NP, and I met with pt in the presence of her 2 daughters and her son with whom she lives. Her other son in [place] was on speaker phone. We discussed code status and pt states that she wants to be a DNR. Order entered and out-of-facility DNR completed and placed in chart. We also discussed home hospice and pt and family agree. Hospice screen placed.  talked with pt about the use of morphine for dyspnea. She will try a dose and see if it helps relieve her symptoms. If so, will write for prn dose.   Electronic Signatures: Phifer, Izora Gala (MD)  (Signed 24-Jun-15 13:50)  Authored: Palliative Care   Last Updated: 24-Jun-15 13:50 by Phifer, Izora Gala (MD)

## 2014-11-10 ENCOUNTER — Other Ambulatory Visit: Payer: Self-pay | Admitting: *Deleted

## 2014-11-10 ENCOUNTER — Ambulatory Visit: Payer: Medicare Other | Admitting: Endocrinology

## 2014-11-10 NOTE — Patient Outreach (Signed)
Call made on pt's behalf to cancel and reschedule pt's appointment. New appointment made for 5/10  Plan; Call pt to inform her of appointment time.

## 2014-11-10 NOTE — Patient Outreach (Signed)
Phone call made to pt to ask her how she was feeling. Pt stated she was feeling better and was going to go to have a heart test tomorrow. RNCM and pt talked through the instructions for the heart test to see which medications she was supposed to take prior to test. According to pt's reading of the pre test instructions she was not to take diabetic meds prior to test. Informed pt of reschduled appointment time with Dr. Welford RochePhadke on 5/10.   Plan: See pt next week on 5/2   Tyishia Aune RN, BSN  [facility] [phone])

## 2014-11-10 NOTE — Patient Outreach (Signed)
Pt called RNCM to inform her pt was not feeling well related to a kidney infection and had decided to cancel appt with Dr. Welford RochePhadke. RNCM requested pt reschedule appointment related to the fact her A1C was high and blood sugars were not well controlled. Pt requested RNCM reschedule because she was not feeling well.  Plan: Call Phadke MD office to cancel and reschedule appointment.  Call pt later this week to check on how she is feeling.  Costella HatcherJanci Hollis Tuller RN, BSN  [facility] [phone])

## 2014-11-11 ENCOUNTER — Other Ambulatory Visit: Payer: Self-pay | Admitting: Pharmacist

## 2014-11-11 NOTE — Patient Outreach (Signed)
Called to follow up with Carla Harris. Carla Harris reports that she has had a urinary tract infection this week, but that her urinary symptoms are improved today. Reports that she is still having the same dizziness when she stands up, but continues to do so carefully. Asked her about potential correlation between her dizziness and her lower blood sugars. She stated that she does not think there is a correlation.   [name], PharmD Clinical Pharmacist [facility] Management [phone]

## 2014-11-14 ENCOUNTER — Encounter: Payer: Self-pay | Admitting: *Deleted

## 2014-11-14 ENCOUNTER — Other Ambulatory Visit: Payer: Self-pay | Admitting: *Deleted

## 2014-11-14 VITALS — BP 116/62 | HR 60 | Resp 20 | Wt 104.0 lb

## 2014-11-14 DIAGNOSIS — J449 Chronic obstructive pulmonary disease, unspecified: Secondary | ICD-10-CM

## 2014-11-14 NOTE — Patient Outreach (Signed)
[facility]) Care Management   11/14/2014  Carla Harris May 05, 1929 [id number]  Pieper C Liberman is an 81 y.o. female  Subjective: " I want to get my strength up. I don't like depending on others to drive me places. I am still wobbly when I stand up and am having a hard time getting in out of the car.My ankles and feet have not been as swollen since I stopped drinking three cups of coffee a day. "   Objective:   weight is 104 lb (47.174 kg). Her blood pressure is 116/62 and her pulse is 60. Her respiration is 20 and oxygen saturation is 96%. on intermittent 2lpm of O2.  Review of Systems  Constitutional: Positive for malaise/fatigue.  HENT: Positive for congestion and hearing loss.   Eyes: Positive for discharge.       Noted eye discharge in the am  Respiratory: Positive for cough, sputum production, shortness of breath and wheezing.   Cardiovascular: Positive for leg swelling.  Genitourinary:       Pt on antibiotic for UTI last week  Neurological: Positive for dizziness and weakness.       Feels wobbly when she first stands  Endo/Heme/Allergies: Positive for environmental allergies. Bruises/bleeds easily.  Psychiatric/Behavioral: Positive for depression. The patient is not nervous/anxious.        Says she feels depressed on occaision  All other systems reviewed and are negative.   Physical Exam  Constitutional: She is oriented to person, place, and time.  HENT:  Head:    Cardiovascular: Normal rate, regular rhythm and normal heart sounds.   Pulses:      Dorsalis pedis pulses are 2+ on the right side, and 2+ on the left side.  Respiratory: Effort normal. She has wheezes in the right upper field, the right middle field, the right lower field, the left upper field, the left middle field and the left lower field. She has rhonchi in the right lower field and the left lower field.  GI: Bowel sounds are normal.  Musculoskeletal: She exhibits edema.  +1 bilateral feet    Neurological: She is alert and oriented to person, place, and time.  Skin: Skin is warm and dry.     Pt noted to have healing bruises and small scratches she states from pet. Pt's small pet noted to jump on pt with sharp nails.   Psychiatric: She has a normal mood and affect. Her speech is normal and behavior is normal. Judgment and thought content normal. Cognition and memory are normal.  Pt admitted to feeling some slight depression, but stated she doesn't let herself dwell there because she knows others have it worse than her.     Current Medications:   Current Outpatient Prescriptions  Medication Sig Dispense Refill  . acetylcysteine (MUCOMYST) 20 % nebulizer solution Take 2 mLs by nebulization 2 (two) times daily.    Marland Kitchen albuterol (PROVENTIL HFA;VENTOLIN HFA) 108 (90 BASE) MCG/ACT inhaler Inhale 1 puff into the lungs every 6 (six) hours as needed for wheezing or shortness of breath.    Marland Kitchen albuterol (PROVENTIL) (2.5 MG/3ML) 0.083% nebulizer solution Inhale into the lungs.    Marland Kitchen albuterol-ipratropium (COMBIVENT) 18-103 MCG/ACT inhaler Inhale 2 puffs into the lungs every 6 (six) hours as needed.    Marland Kitchen aspirin EC 81 MG tablet Take 81 mg by mouth daily.    . cetirizine (ZYRTEC) 10 MG tablet Take 10 mg by mouth daily.    . Cholecalciferol (VITAMIN D3) 2000 UNITS  capsule Take 2,000 Units by mouth daily.    Marland Kitchen diltiazem (CARDIZEM) 120 MG tablet Take 120 mg by mouth daily.    Marland Kitchen esomeprazole (NEXIUM) 40 MG capsule Take 40 mg by mouth daily before breakfast. Take 1 capsule twice a day    . Fluticasone-Salmeterol (ADVAIR) 250-50 MCG/DOSE AEPB Inhale 1 puff into the lungs every 12 (twelve) hours.    . formoterol (PERFOROMIST) 20 MCG/2ML nebulizer solution Take 20 mcg by nebulization 2 (two) times daily.    . furosemide (LASIX) 20 MG tablet Take 20 mg by mouth.    Marland Kitchen glucose blood test strip Use as instructed to check sugars 2-3 times a day Dx: 250.00 100 each 12  . hydrOXYzine (ATARAX/VISTARIL) 25 MG  tablet Take 25 mg by mouth 3 (three) times daily as needed.    . insulin aspart (NOVOLOG FLEXPEN) 100 UNIT/ML FlexPen Inject into the skin.    Marland Kitchen insulin detemir (LEVEMIR) 100 UNIT/ML injection Inject 0.08 mLs (8 Units total) into the skin at bedtime. 10 mL 1  . Insulin Pen Needle (UNIFINE PENTIPS) 31G X 8 MM MISC Use with sliding scale novolog and levemir insulins. 100 each 3  . metFORMIN (GLUCOPHAGE) 500 MG tablet Take 1 tablet (500 mg total) by mouth 2 (two) times daily with a meal. Two times a day with meal. 180 tablet 3  . montelukast (SINGULAIR) 10 MG tablet Take 10 mg by mouth at bedtime.    . Multiple Vitamin (MULTIVITAMIN) tablet Take 1 tablet by mouth daily.    . pravastatin (PRAVACHOL) 40 MG tablet Take 1 tablet (40 mg total) by mouth daily. (Patient not taking: Reported on 10/14/2014) 30 tablet 5  . predniSONE (DELTASONE) 20 MG tablet Take 20 mg by mouth 2 (two) times daily with a meal.    . rivaroxaban (XARELTO) 10 MG TABS tablet Take 10 mg by mouth daily.    . sotalol (BETAPACE) 80 MG tablet Take 80 mg by mouth 2 (two) times daily.    . theophylline (THEODUR) 200 MG 12 hr tablet Take 200 mg by mouth 2 (two) times daily.    Marland Kitchen tiotropium (SPIRIVA) 18 MCG inhalation capsule Place 18 mcg into inhaler and inhale daily.     No current facility-administered medications for this visit.    Functional Status:   In your present state of health, do you have any difficulty performing the following activities: 10/14/2014  Hearing? N  Vision? N  Difficulty concentrating or making decisions? N  Walking or climbing stairs? Y  Dressing or bathing? N  Doing errands, shopping? Y  Preparing Food and eating ? N  Using the Toilet? N  In the past six months, have you accidently leaked urine? N  Do you have problems with loss of bowel control? N  Managing your Medications? Y  Managing your Finances? N  Housekeeping or managing your Housekeeping? Y    Fall/Depression Screening:    PHQ 2/9 Scores  10/14/2014 10/14/2014 07/27/2013 07/10/2012  PHQ - 2 Score 4 4 2  0  PHQ- 9 Score 12 - 5 -   [facility] CM Care Plan Problem One        Patient Outreach from 11/14/2014 in 01/14/2015 Most recent reading at 11/14/2014 12:45 PM   Patient Outreach from 10/14/2014 in 12/14/2014 Most recent reading at 10/14/2014  1:34 PM   Patient Outreach from 10/14/2014 in 12/14/2014 Most recent reading at 10/14/2014 12:19 PM   Care Plan Problem One  Pt wants  to increase her strength to be able to travel with her children  Pt wants to increase her strength to be able to travel with her children  Medication Adherence   Care Plan for Problem One  Active  Active  Active   [facility] Long Term Goal Start Date  10/14/14  10/14/14  10/14/14   Interventions for Problem One Long Term Goal  Discussed with pt the disease zones related to COPD and when to cal the MD. Pharmacy consult to assist with medication management  and adherence.  Discussed with pt the disease zones related to COPD and when to cal the MD. Pharmacy consult to assist with medication management  and adherence.  I educated Ms. Waszak on the importance of using them daily to control her COPD and Diabetes.  I instructed her to use Spiriva daily and rely on the Combiventf for as needed treatment.  I instucted her to leave her Levemir out by her medication to help her remember to take it daily.     [facility] CM Short Term Goal #1 (0-30 days)  Pt will visit endocrinologist in the next 30 days.  Pt will visit endocrinologist in the next 30 days.  Ms Holstein will use her Levemir daily over the next 30 days evidence by patient report   [facility] CM Short Term Goal #1 Start Date  10/14/14  10/14/14  10/14/14   [facility] CM Short Term Goal #1 Met Date  -- [Pt did not meet goal because of illness but resceduled appointment for this month]       Interventions for Short Term Goal #1  Call to make an appointment with the dr.   Call to make an appointment with the dr.   I instructed  Ms. Carillo to place the Levemir beside her pill box to help reminder to take daily   [facility] CM Short Term Goal #2 (0-30 days)  Pt will check am blood sugar every day until the endocrinology appointment   Pt will check am blood sugar every day until the endocrinology appointment   Ms. Callaham will use Spiriva daily over the next 30 days by evidence of patient report   [facility] CM Short Term Goal #2 Start Date  10/14/14  10/14/14  10/14/14   [facility] CM Short Term Goal #2 Met Date  11/14/14       Interventions for Short Term Goal #2  Discussed the importance of the endocrinologist having data to be able to adjust medications.   Discussed the importance of the endocrinologist having data to be able to adjust medications.   Instructed patient use Spiriva daily and use Combivent as needed   [facility] CM Short Term Goal #3 (0-30 days)  Pt will attend endocrinology appointment with in the next 30 days to obtain a new glucometer and diabetic plan       [facility] CM Short Term Goal #3 Start Date  11/14/14       Interventions for Short Tern Goal #3  previous appointment cancelled but rescheduled by [facility] at request of pt. Reinforced the importance of having sugars regulated and obtaining a new meter.       [facility] CM Short Term Goal #4 (0-30 days)  Pt will exercise 3x per week for the next 30 days to help increase strength       [facility] CM Short Term Goal #4 Start Date  11/14/14       Interventions for Short Term Goal #4  Discussed the importance of exercise  in treating mild depression. Education sheet provided on depression stressing the importance of exercise. Also discussed the importance of exercise in the endurance process.          Assessment: COPD: Alert and oriented x3. Pt's lungs noted to have scattered wheezes with ronchi to the bases. Pt had already taken her nebulizer and inhalers for this am. She stated she did feel "tight". Discussed with pt her rescue plan in case of an emergency. Pt is currently on an antibiotic for a UTI and  admits to not be feeling well the past week. Has a follow up with lung specialist Flemming in 2weeks. Cont to use 2lpm of O2 at night and intermittently though out the day.   DM: Pt followed through with checking her blood sugars every day for 1 month to obtain data for the endocrinology appointment. She also admitted to using her Levimir pen every night as ordered except once when she forgot. Sugars were mostly wnl. A couple of lows were noted. Action plan and signs and symptoms of low sugar were discussed. Pt encouraged to eat a snack before bed each evening. Endocrinology appt rescheduled for 5/10. Black "blood blister" to the bottom pt left foot being looked at weekly by son and pt and both agree there has been no change.   HF: Leg swelling to bilateral lower extremities decreased from the last RNCM visit. Pt's weight 104 and remains steady. Pt reports feeling SOB related to COPD not HF.   Plan: RNCM made a reminder note to assist pt in remembering to take nightly insulin, walk three times per week and watched pt add new endocrinology appt to calendar.  RNCM to attend endocrinology appt with pt for 5/10. RNCM scheduled follow up appt for 1 month 6/3.   Rutherford Limerick RN, BSN  [facility] Care Management [phone])

## 2014-11-22 ENCOUNTER — Ambulatory Visit (INDEPENDENT_AMBULATORY_CARE_PROVIDER_SITE_OTHER): Payer: Medicare Other | Admitting: Endocrinology

## 2014-11-22 VITALS — BP 122/62 | HR 62 | Resp 20 | Ht 62.0 in | Wt 104.2 lb

## 2014-11-22 DIAGNOSIS — E7800 Pure hypercholesterolemia, unspecified: Secondary | ICD-10-CM

## 2014-11-22 DIAGNOSIS — E119 Type 2 diabetes mellitus without complications: Secondary | ICD-10-CM | POA: Diagnosis not present

## 2014-11-22 LAB — HEMOGLOBIN A1C: Hgb A1c MFr Bld: 7.8 % — ABNORMAL HIGH (ref 4.6–6.5)

## 2014-11-22 LAB — HM DIABETES FOOT EXAM: HM Diabetic Foot Exam: NORMAL

## 2014-11-22 MED ORDER — ONETOUCH DELICA LANCETS 33G MISC: Status: AC

## 2014-11-22 MED ORDER — GLUCOSE BLOOD VI STRP: ORAL_STRIP | Status: DC

## 2014-11-22 NOTE — Progress Notes (Signed)
Pre visit review using our clinic review tool, if applicable. No additional management support is needed unless otherwise documented below in the visit note. 

## 2014-11-22 NOTE — Patient Instructions (Addendum)
Check sugars 1-2 times daily ( fasting and premeal readings at alternating times, or at bedtime).  Record them in a sugar log and bring log and meter to next appointment.   Decrease levemir to 6 units at bedtime.  Lets try to get off insulin at the follow up visit.    Continue metformin.   Please come back for a follow-up appointment in 2-3 weeks

## 2014-11-22 NOTE — Progress Notes (Signed)
Patient ID: Carla Harris, female   DOB: [birth year], 81 y.o.   MRN: [id number]   Reason for visit-  Carla Harris is a 81 y.o.-year-old female, referred by her PCP,  Dale [place], MD for management of Type 2 diabetes, uncontrolled, without complications. Here with Ma Rings [facility] RN.    HPI- Patient has been diagnosed with diabetes in few years back.   Tried  Metformin, levemir and Novolog. she has  been on insulin since 2015.   * patient was hospitalized 2015 and has been on insulin. Lately only on basal insulin. *working with [facility] for sugar checks and other appts *Lives with son *self sufficient with ADL, on Home O2 at night time and during activity * For COPD had needed freq steroids and recently taking prednisone 20 mg daily ( hasn't been lower than 20 mg daily dose recently). Now feels that she has a pulm infection and going to start on abx today and has follow up with pulm, dr Meredeth Ide, this friday  Pt is currently on a regimen of: - Metformin 500 mg po bid - Levemir 8 units qhs ( on this dose for the past 2 months at least)    Last hemoglobin A1c was: Lab Results  Component Value Date   HGBA1C 7.8* 11/22/2014   HGBA1C 6.9* 12/23/2012     Pt checks her sugars 1 a day . Uses ? glucometer. By log they are: *hasn't checked in past week, feels that she has an infection  PREMEAL Breakfast Lunch Dinner Bedtime Overall  Glucose range:     72-145  Mean/median:        POST-MEAL PC Breakfast PC Lunch PC Dinner  Glucose range:     Mean/median:       Hypoglycemia-  Few recent lows in April during the morning. Lowest sugar was 72; she hasn't hypoglycemia awareness at 70.   Dietary habits- eats three times daily. Tries to limit carbs, sweetened beverages, sodas, desserts. Eats white bread toast for BF-Loves desserts and pies. Other foods include-graham crackers, can soup, fried chicken breasts Exercise- active around the house Weight -  Wt Readings from Last 3 Encounters:   11/22/14 104 lb 4 oz (47.287 kg)  11/14/14 104 lb (47.174 kg)  10/14/14 104 lb (47.174 kg)    Diabetes Complications-  Nephropathy- No  CKD, last BUN/creatinine-   Lab Results  Component Value Date   BUN 18 07/01/2014   CREATININE 0.6 07/01/2014   Lab Results  Component Value Date   GFR 94.15 07/01/2014   Lab Results  Component Value Date   MICRALBCREAT 1.1 12/23/2012     Retinopathy- No, Last DEE was in 2 years ago, prior cataract surgery Neuropathy- no numbness and tingling in )her feet. No known neuropathy.  Associated history - No CAD -has afib. No prior stroke. No hypothyroidism. her last TSH was  Lab Results  Component Value Date   TSH 1.586 07/04/2014    Hyperlipidemia-  her last set of lipids were- Currently was supposed to be pravastatin ( -stopped by PCP in setting of hospital recovery 2015). Has been off since past month Lab Results  Component Value Date   CHOL 249* 07/01/2014   HDL 43.80 07/01/2014   LDLCALC 169* 07/01/2014   LDLDIRECT 139.1 12/23/2012   TRIG 182.0* 07/01/2014   CHOLHDL 6 07/01/2014    Blood Pressure/HTN- Patient's blood pressure is well controlled today on current regimen that includes ACE-I/ARB.  Pt has FH of DM in daughter.  I have  reviewed the patient's past medical history, family and social history, surgical history, medications and allergies.  Past Medical History  Diagnosis Date  . Anemia   . Diabetes mellitus without complication   . Bronchiectasis   . Colon polyps   . Hypercholesteremia   . S/P AAA repair   . Right rotator cuff tear   . COPD (chronic obstructive pulmonary disease)   . Low serum IgG1 and IgM levels   . Oxygen deficiency   . Cataract     resolved with surgery   Past Surgical History  Procedure Laterality Date  . Appendectomy    . Abdominal hysterectomy    . Abdominal aortic aneurysm repair    . Rotator cuff repair     Family History  Problem Relation Age of Onset  . COPD Mother   . Liver  cancer Sister   . Colon cancer Maternal Grandmother   . Diabetes Daughter    History   Social History  . Marital Status: Widowed    Spouse Name: N/A  . Number of Children: N/A  . Years of Education: N/A   Occupational History  . Not on file.   Social History Main Topics  . Smoking status: Current Every Day Smoker    Types: Cigarettes  . Smokeless tobacco: Never Used     Comment: Has decreased to about 5 a day  . Alcohol Use: No  . Drug Use: No  . Sexual Activity: Not on file   Other Topics Concern  . Not on file   Social History Narrative   Current Outpatient Prescriptions on File Prior to Visit  Medication Sig Dispense Refill  . acetylcysteine (MUCOMYST) 20 % nebulizer solution Take 2 mLs by nebulization 2 (two) times daily.    Marland Kitchen. albuterol (PROVENTIL HFA;VENTOLIN HFA) 108 (90 BASE) MCG/ACT inhaler Inhale 1 puff into the lungs every 6 (six) hours as needed for wheezing or shortness of breath.    Marland Kitchen. albuterol (PROVENTIL) (2.5 MG/3ML) 0.083% nebulizer solution Inhale into the lungs.    Marland Kitchen. albuterol-ipratropium (COMBIVENT) 18-103 MCG/ACT inhaler Inhale 2 puffs into the lungs every 6 (six) hours as needed.    . cetirizine (ZYRTEC) 10 MG tablet Take 10 mg by mouth daily.    . Cholecalciferol (VITAMIN D3) 2000 UNITS capsule Take 2,000 Units by mouth daily.    Marland Kitchen. esomeprazole (NEXIUM) 40 MG capsule Take 40 mg by mouth daily before breakfast. Take 1 capsule twice a day    . Fluticasone-Salmeterol (ADVAIR) 250-50 MCG/DOSE AEPB Inhale 1 puff into the lungs every 12 (twelve) hours.    . hydrOXYzine (ATARAX/VISTARIL) 25 MG tablet Take 25 mg by mouth 3 (three) times daily as needed.    . insulin detemir (LEVEMIR) 100 UNIT/ML injection Inject 0.08 mLs (8 Units total) into the skin at bedtime. 10 mL 1  . Insulin Pen Needle (UNIFINE PENTIPS) 31G X 8 MM MISC Use with sliding scale novolog and levemir insulins. 100 each 3  . metFORMIN (GLUCOPHAGE) 500 MG tablet Take 1 tablet (500 mg total) by  mouth 2 (two) times daily with a meal. Two times a day with meal. (Patient taking differently: Take 500 mg by mouth daily with breakfast. Two times a day with meal.) 180 tablet 3  . montelukast (SINGULAIR) 10 MG tablet Take 10 mg by mouth at bedtime.    . Multiple Vitamin (MULTIVITAMIN) tablet Take 1 tablet by mouth daily.    . predniSONE (DELTASONE) 20 MG tablet Take 20 mg by mouth 2 (two)  times daily with a meal.    . rivaroxaban (XARELTO) 10 MG TABS tablet Take 15 mg by mouth daily.     . sotalol (BETAPACE) 80 MG tablet Take 160 mg by mouth 2 (two) times daily.     . theophylline (THEODUR) 200 MG 12 hr tablet Take 200 mg by mouth 2 (two) times daily.    Marland Kitchen tiotropium (SPIRIVA) 18 MCG inhalation capsule Place 18 mcg into inhaler and inhale daily.     No current facility-administered medications on file prior to visit.   Allergies  Allergen Reactions  . Advil [Ibuprofen] Swelling  . Ciprofloxacin   . Levaquin [Levofloxacin In D5w]   . Penicillins   . Septra [Sulfamethoxazole-Trimethoprim] Other (See Comments)    Itching and swelling of the face and hands  . Sulfa Antibiotics   . Doxycycline Itching     Review of Systems:  complains of  [  ] denies General:   [  ] Recent weight change [x  ] Fatigue  [  ] Loss of appetite Eyes: [  ]  Vision Difficulty [  ]  Eye pain ENT: [  ]  Hearing difficulty [  ]  Difficulty Swallowing CVS: [  ] Chest pain [  ]  Palpitations/Irregular Heart beat [  ]  Shortness of breath lying flat [ x ] Swelling of legs Resp: [ x ] Frequent Cough [  x] Shortness of Breath  [x  ]  Wheezing GI: [  ] Heartburn  [  ] Nausea or Vomiting  [  ] Diarrhea [  ] Constipation  [  ] Abdominal Pain GU: [ x ]  Polyuria  [ x ]  nocturia Bones/joints:  [  ]  Muscle aches  [  ] Joint Pain  [  ] Bone pain Skin/Hair/Nails: [  ]  Rash  [  x] New stretch marks [x  ]  Itching [  ] Hair loss [  ]  Excessive hair growth Reproduction: [  ] Low sexual desire , [  ]  Women: Menstrual  cycle problems [  ]  Women: Breast Discharge [  ] Men: Difficulty with erections [  ]  Men: Enlarged Breasts CNS: [  ] Frequent Headaches [  ] Blurry vision [  ] Tremors [  ] Seizures [  ] Loss of consciousness [  ] Localized weakness Endocrine: [  ]  Excess thirst [  ]  Feeling excessively hot [  ]  Feeling excessively cold Heme: [  ]  Easy bruising [  ]  Enlarged glands or lumps in neck Allergy: [  ]  Food allergies [  ] Environmental allergies  PE: BP 122/62 mmHg  Pulse 62  Resp 20  Ht  (1.575 m)  Wt 104 lb 4 oz (47.287 kg)  BMI 19.06 kg/m2  SpO2 93%  LMP 07/01/1969 Wt Readings from Last 3 Encounters:  11/22/14 104 lb 4 oz (47.287 kg)  11/14/14 104 lb (47.174 kg)  10/14/14 104 lb (47.174 kg)   GENERAL: No acute distress, well developed, on O2 therapy HEENT:  Eye exam shows normal external appearance. Oral exam shows normal mucosa .  NECK:   Neck exam shows no lymphadenopathy. No Carotids bruits. Thyroid is not enlarged and no nodules felt.  no acanthosis nigricans LUNGS:         Chest is symmetrical. Lungs are clear to auscultation.Marland Kitchen   HEART:         Heart sounds:  S1 and S2 are normal. No murmurs or clicks heard. ABDOMEN:  No Distention present. Liver and spleen are not palpable. No other mass or tenderness present.  EXTREMITIES:     There is 1+ edema. 2+ DP pulses  NEUROLOGICAL:     Grossly intact.            Diabetic foot exam done with shoes and socks removed: Normal Monofilament testing bilaterally. No deformities of toes.  Nails  Not dystrophic. Skin normal color. No open wounds. Dry skin. ?small bruise/supf foreign body left foot,, no signs of infection MUSCULOSKELETAL:       There is no enlargement or gross deformity of the joints.  SKIN:       No rash  ASSESSMENT AND PLAN: Problem List Items Addressed This Visit      Endocrine   Diabetes mellitus - Primary    Discussed with her need to update A1c. Recent sugars in the morning are lower on current dose of levemir.   Discussed that her goal sugars could be higher given he age and comorbidities.  Now that she has a pulm infection, and random sugars today was elevated in the 239 range PP post 2 toasts, would not stop low dose levemir, but decrease dose to 6 units daily.  They will call me on Friday to notify me of any changes to her steroid dose.  In the meantime, I have asked her to check sugars 1-2 times daily at varying times of the day, and bring log to next appointment. Ma RingsJanci mentioned that the patient doesn't like the injectable therapy and doesn't like checking her sugars.  I will try to take her off the insulin, and consider low dose DPPIV if needed to cover her sugars. This trial could be attempted at next visit.  Continue current metformin. Once she is off the insulin( hopefully, as she is on low dose), then I can attempt to decrease her testing freq to 3 times weekly.        Relevant Orders   Hemoglobin A1c (Completed)     Other   Hypercholesterolemia    currently off statin given advanced age            - Return to clinic in 2-3 weeks with sugar log/meter. If any change in small wound on left foot, call PCP for appt  [facility] HospitalHADKE,Carin Shipp [facility] HospitalUSHKAR 11/25/2014 11:22 AM

## 2014-11-23 ENCOUNTER — Other Ambulatory Visit: Payer: Self-pay | Admitting: *Deleted

## 2014-11-23 ENCOUNTER — Encounter: Payer: Self-pay | Admitting: *Deleted

## 2014-11-23 NOTE — Patient Outreach (Signed)
Pt called this morning to inquire about her new glucometer she received from the MD appt yesterday. RNCM did some trouble shooting over the phone and plans to go by her home later today to assist her with setting it up.   Plan: RNCM will go by pt's home to assist her with glucometer set up.  RNCM will use teach back method to confirm pt's understanding of glucometer and new blood sugar testing schedule requested by the MD at 5/10 appointment.  Costella HatcherJanci Cantrell Larouche RN, BSN  [facility] Care Management [phone])

## 2014-11-23 NOTE — Patient Outreach (Signed)
[facility] Care Management  11/22/2014  Antoine Primasatsy C Kates 12/22/1928 [id number]   RNCM accompanied pt to endocrinologists office per her request. Physical exam done by MD.  MD changed insulin dose of Levemir to 6units and requested pt check her sugar randomly once a day preprandial and before bedtime. MD gave pt a new glucometer. After appointment RNCM sat with pt at her car and re-explained the MD's instructions and wrote the instructions down clearly for pt. Teach back method used to make sure pt understood MD instructions.   Plan: RNCM will make a calendar to assist pt with remembering to check sugar and take insulin.   Costella HatcherJanci Haylee Mcanany RN, BSN  [facility] Care [facility] [phone])

## 2014-11-23 NOTE — Patient Outreach (Signed)
[facility]) Care Management  11/23/2014  Antoine Primasatsy C Esters 05/14/1929 [id number]  RNCM went to pt's home at 5:15pm to meet with daughter after she got off work to instruct pt and daughter about new glucometer. Talked with pt and daughter about endocrinologist's plan for pt to check sugar randomly once a day before meals and before bedtime over the next few weeks. Pt given a calendar with times written in for each day to assist pt in obtaining sugars at the times MD specified. Daughter noted to be extremely supportive of pt and encouraged pt to follow MD's instructions. Pt verbalized understanding of new glucometer and blood sugar check regimen.   Plan: RNCM plans to go with pt to next endocrinologists appt on 5/31.   Costella HatcherJanci Dara Camargo RN, BSN  [facility] Care Management [phone])

## 2014-11-23 NOTE — Patient Outreach (Signed)
No further pharmacy issues at this time. Have confirmed that Ms. Haven has my contact information.  Duanne MoronElisabeth Rin Gorton, PharmD Clinical Pharmacist [facility] Care Management [phone]

## 2014-11-25 ENCOUNTER — Encounter: Payer: Self-pay | Admitting: Endocrinology

## 2014-11-25 NOTE — Assessment & Plan Note (Signed)
currently off statin given advanced age

## 2014-11-25 NOTE — Assessment & Plan Note (Addendum)
Discussed with her need to update A1c. Recent sugars in the morning are lower on current dose of levemir.  Discussed that her goal sugars could be higher given he age and comorbidities.  Now that she has a pulm infection, and random sugars today was elevated in the 239 range PP post 2 toasts, would not stop low dose levemir, but decrease dose to 6 units daily.  They will call me on Friday to notify me of any changes to her steroid dose.  In the meantime, I have asked her to check sugars 1-2 times daily at varying times of the day, and bring log to next appointment. Ma RingsJanci mentioned that the patient doesn't like the injectable therapy and doesn't like checking her sugars.  I will try to take her off the insulin, and consider low dose DPPIV if needed to cover her sugars. This trial could be attempted at next visit.  Continue current metformin. Once she is off the insulin( hopefully, as she is on low dose), then I can attempt to decrease her testing freq to 3 times weekly.

## 2014-11-29 NOTE — Patient Outreach (Signed)
[facility]) Care Management  11/29/2014  Carla Primasatsy C Bevacqua 01/28/1929 [id number]   Documentation that [facility] is no longer involved in Carla Harris's care.  We are available on an as needed basis.   Steve Rattlerawn Harriet Bollen, PharmD, [employer] [facility] Pharmacy Manager [phone]

## 2014-11-30 ENCOUNTER — Encounter: Payer: Self-pay | Admitting: Internal Medicine

## 2014-11-30 DIAGNOSIS — I2510 Atherosclerotic heart disease of native coronary artery without angina pectoris: Secondary | ICD-10-CM | POA: Insufficient documentation

## 2014-12-13 ENCOUNTER — Other Ambulatory Visit: Payer: Self-pay | Admitting: *Deleted

## 2014-12-13 ENCOUNTER — Ambulatory Visit: Payer: Medicare Other | Admitting: Endocrinology

## 2014-12-13 NOTE — Patient Outreach (Signed)
RNCM spoke with pt this morning related to a voice mail pt had left related to changing pt's appt. RNCM had planned to go to pt's endocrinology appt with pt this morning but appt was changed by pt because of another appt. [facility] at 9am and pt new endocrinology appt scheduled for 11:15 am. Pt and RNCM discussed that we would keep both appts and RNCM would attend the endocrinology appt as originally planned.  Plan: RNCM will see pt at her home at 9am in the am (6/1) and follow her to endocrinology appt noting any changes.   Costella HatcherJanci Donnel Venuto RN, BSN  [facility] Care Management [phone])

## 2014-12-14 ENCOUNTER — Ambulatory Visit (INDEPENDENT_AMBULATORY_CARE_PROVIDER_SITE_OTHER): Payer: Medicare Other | Admitting: Endocrinology

## 2014-12-14 ENCOUNTER — Other Ambulatory Visit: Payer: Self-pay | Admitting: *Deleted

## 2014-12-14 ENCOUNTER — Encounter: Payer: Self-pay | Admitting: Endocrinology

## 2014-12-14 VITALS — BP 116/58 | HR 68 | Resp 12 | Ht 62.0 in | Wt 104.5 lb

## 2014-12-14 DIAGNOSIS — E119 Type 2 diabetes mellitus without complications: Secondary | ICD-10-CM

## 2014-12-14 NOTE — Progress Notes (Signed)
Reason for visit-  Carla C Tenny is a 81 y.o.-year-old female, for management of Type 2 diabetes, uncontrolled, without complications. Here with Ma Rings [facility] RN.  Last visit May 2016.   HPI- Patient has been diagnosed with diabetes in few years back.   Tried  Metformin, levemir and Novolog. she has  been on insulin since 2015.   * patient was hospitalized 2015 and has been on insulin. Lately only on basal insulin. *working with [facility] for sugar checks and other appts *Lives with son *self sufficient with ADL, on Home O2 at night time and during activity * For COPD had needed freq steroids and recently taking prednisone 20 mg daily ( hasn't been lower than 20 mg daily dose recently). Pulm- Dr Meredeth Ide. Recently completed course of abx.    Pt is currently on a regimen of: - Metformin 500 mg po bid - Levemir 6 units qhs (decreased since last time)  * tired of taking shots and wants to get off the insulin if possible   Last hemoglobin A1c was: Lab Results  Component Value Date   HGBA1C 7.8* 11/22/2014   HGBA1C 6.9* 12/23/2012     Pt checks her sugars 1 a day . Uses ? glucometer. By log they are:   PREMEAL Breakfast Lunch Dinner Bedtime Overall  Glucose range:     97-269  Mean/median:        POST-MEAL PC Breakfast PC Lunch PC Dinner  Glucose range:     Mean/median:       Hypoglycemia-  Few recent lows in April during the morning. Lowest sugar was 97; she hasn't hypoglycemia awareness at 70.   Dietary habits- eats three times daily. Tries to limit carbs, sweetened beverages, sodas, desserts. Eats white bread toast for BF-Loves desserts and pies. Other foods include-graham crackers, can soup, fried chicken breasts Exercise- active around the house Weight -  Wt Readings from Last 3 Encounters:  12/14/14 104 lb 8 oz (47.401 kg)  11/22/14 104 lb 4 oz (47.287 kg)  11/14/14 104 lb (47.174 kg)    Diabetes Complications-  Nephropathy- No  CKD, last BUN/creatinine-   Lab  Results  Component Value Date   BUN 18 07/01/2014   CREATININE 0.6 07/01/2014   Lab Results  Component Value Date   GFR 94.15 07/01/2014   Lab Results  Component Value Date   MICRALBCREAT 1.1 12/23/2012     Retinopathy- No, Last DEE was in 2 years ago, prior cataract surgery Neuropathy- no numbness and tingling in )her feet. No known neuropathy.  Associated history - No CAD -has afib. No prior stroke. No hypothyroidism. her last TSH was  Lab Results  Component Value Date   TSH 1.586 07/04/2014    Hyperlipidemia-  her last set of lipids were- Currently was supposed to be pravastatin ( -stopped by PCP in setting of hospital recovery 2015). Has been off since past months Lab Results  Component Value Date   CHOL 249* 07/01/2014   HDL 43.80 07/01/2014   LDLCALC 169* 07/01/2014   LDLDIRECT 139.1 12/23/2012   TRIG 182.0* 07/01/2014   CHOLHDL 6 07/01/2014    Blood Pressure/HTN- Patient's blood pressure is well controlled today on current regimen that includes ACE-I/ARB.  Pt has FH of DM in daughter.  I have reviewed the patient's past medical history, family and social history, surgical history, medications and allergies.   Current Outpatient Prescriptions on File Prior to Visit  Medication Sig Dispense Refill  . acetylcysteine (MUCOMYST) 20 % nebulizer solution  Take 2 mLs by nebulization 2 (two) times daily.    Marland Kitchen albuterol (PROVENTIL HFA;VENTOLIN HFA) 108 (90 BASE) MCG/ACT inhaler Inhale 1 puff into the lungs every 6 (six) hours as needed for wheezing or shortness of breath.    Marland Kitchen albuterol (PROVENTIL) (2.5 MG/3ML) 0.083% nebulizer solution Inhale into the lungs.    Marland Kitchen albuterol-ipratropium (COMBIVENT) 18-103 MCG/ACT inhaler Inhale 2 puffs into the lungs every 6 (six) hours as needed.    . cetirizine (ZYRTEC) 10 MG tablet Take 10 mg by mouth daily.    . Cholecalciferol (VITAMIN D3) 2000 UNITS capsule Take 2,000 Units by mouth daily.    Marland Kitchen esomeprazole (NEXIUM) 40 MG capsule Take  40 mg by mouth daily before breakfast. Take 1 capsule twice a day    . Fluticasone-Salmeterol (ADVAIR) 250-50 MCG/DOSE AEPB Inhale 1 puff into the lungs every 12 (twelve) hours.    Marland Kitchen glucose blood (ONETOUCH VERIO) test strip Test sugars 3 times daily 100 each 12  . hydrOXYzine (ATARAX/VISTARIL) 25 MG tablet Take 25 mg by mouth 3 (three) times daily as needed.    . insulin detemir (LEVEMIR) 100 UNIT/ML injection Inject 0.08 mLs (8 Units total) into the skin at bedtime. (Patient taking differently: Inject 6 Units into the skin at bedtime. ) 10 mL 1  . Insulin Pen Needle (UNIFINE PENTIPS) 31G X 8 MM MISC Use with sliding scale novolog and levemir insulins. 100 each 3  . metFORMIN (GLUCOPHAGE) 500 MG tablet Take 1 tablet (500 mg total) by mouth 2 (two) times daily with a meal. Two times a day with meal. (Patient taking differently: Take 500 mg by mouth daily with breakfast. Two times a day with meal.) 180 tablet 3  . montelukast (SINGULAIR) 10 MG tablet Take 10 mg by mouth at bedtime.    . Multiple Vitamin (MULTIVITAMIN) tablet Take 1 tablet by mouth daily.    Letta Pate DELICA LANCETS 33G MISC Test sugars three times daily 100 each 11  . predniSONE (DELTASONE) 20 MG tablet Take 20 mg by mouth 2 (two) times daily with a meal.    . rivaroxaban (XARELTO) 10 MG TABS tablet Take 15 mg by mouth daily.     . sotalol (BETAPACE) 80 MG tablet Take 160 mg by mouth 2 (two) times daily.     . theophylline (THEODUR) 200 MG 12 hr tablet Take 200 mg by mouth 2 (two) times daily.    Marland Kitchen tiotropium (SPIRIVA) 18 MCG inhalation capsule Place 18 mcg into inhaler and inhale daily.     No current facility-administered medications on file prior to visit.   Allergies  Allergen Reactions  . Advil [Ibuprofen] Swelling  . Ciprofloxacin   . Levaquin [Levofloxacin In D5w]   . Penicillins   . Septra [Sulfamethoxazole-Trimethoprim] Other (See Comments)    Itching and swelling of the face and hands  . Sulfa Antibiotics   .  Doxycycline Itching    Review of Systems- [ x ]  Complains of    [  ]  denies [  ] Recent weight change [  ]  Fatigue   [ x ] chest pain- was there for about 2 days and now cleared- felt like something had gotten stuck there [ x ] shortness of breath-stable  [  ] nausea/vomiting [  ] diarrhea [  ] constipation [  ] abdominal pain [  ]  tingling/numbness in extremities [  ]  concern with feet ( wounds/sores)   PE: BP 116/58 mmHg  Pulse  68  Resp 12  Ht 5\' 2"  (1.575 m)  Wt 104 lb 8 oz (47.401 kg)  BMI 19.11 kg/m2  SpO2 94%  LMP 07/01/1969 Wt Readings from Last 3 Encounters:  12/14/14 104 lb 8 oz (47.401 kg)  11/22/14 104 lb 4 oz (47.287 kg)  11/14/14 104 lb (47.174 kg)   Exam: deferred  ASSESSMENT AND PLAN: Problem List Items Addressed This Visit      Endocrine   Diabetes mellitus - Primary    Recent A1c quite good.  Recent sugars in the morning are lower on current dose of levemir.  Discussed that her goal sugars could be higher given her age and comorbidities.  She really wants to try get off the insulin at this time- we dicussed options would be to temporarily hold it for the next 2 weeks and assess her sugars- if they start rising then consider adding alternative like DPPIV or increase metformin ( Cr/GFR are slightly misleading for her age group and wouldn't be keen on going upto full dose metformin).  Check sugars 2 x daily.                - Return to clinic in 2 weeks with sugar log/meter. If any change in small wound on left foot, call PCP for appt. She was also asked to follow back with Dr Lorin PicketScott for her regular care.   Latreshia Beauchaine [facility] 12/15/2014 4:12 PM

## 2014-12-14 NOTE — Patient Outreach (Signed)
[facility]) Care Management   12/14/2014  Carla Harris 04/11/1929 [id number]  Carla Harris is an 81 y.o. female  Subjective: "I went to see Dr. Flemming(pulmonologist) yesterday and his nurse told me he was ordering some special medicine for me, the nurse explained it to me but I am still unsure what it is and why I have to have it?" " I have checked my sugars like the doctor asked me to and I hope she will tell me I don't have to stick myself as much when we go to the Doctor today.""My feet were swollen and purple 4 days ago so I put myself back on my lasix." " I still get dizzy when I stand up, that is if I can get up it's like my hips just won't work."  Objective: Vitals: 68bpm, 20rr, 116/58 b/p, 94% 02 sat on 2lpm. Weight 104 lbs. Review of Systems  Constitutional: Positive for weight loss.  Respiratory: Positive for cough, sputum production, shortness of breath and wheezing.   Cardiovascular: Positive for leg swelling.  Musculoskeletal: Negative for falls.  All other systems reviewed and are negative.   Physical Exam  Cardiovascular: Normal rate, regular rhythm and normal heart sounds.   GI: Soft. Bowel sounds are normal.  Skin: Skin is warm and dry.     Pt with several small healing bruises to hands.      Current Medications:   Current Outpatient Prescriptions  Medication Sig Dispense Refill  . acetylcysteine (MUCOMYST) 20 % nebulizer solution Take 2 mLs by nebulization 2 (two) times daily.    Marland Kitchen albuterol (PROVENTIL HFA;VENTOLIN HFA) 108 (90 BASE) MCG/ACT inhaler Inhale 1 puff into the lungs every 6 (six) hours as needed for wheezing or shortness of breath.    Marland Kitchen albuterol (PROVENTIL) (2.5 MG/3ML) 0.083% nebulizer solution Inhale into the lungs.    Marland Kitchen albuterol-ipratropium (COMBIVENT) 18-103 MCG/ACT inhaler Inhale 2 puffs into the lungs every 6 (six) hours as needed.    . cetirizine (ZYRTEC) 10 MG tablet Take 10 mg by mouth daily.    . Cholecalciferol  (VITAMIN D3) 2000 UNITS capsule Take 2,000 Units by mouth daily.    Marland Kitchen esomeprazole (NEXIUM) 40 MG capsule Take 40 mg by mouth daily before breakfast. Take 1 capsule twice a day    . Fluticasone-Salmeterol (ADVAIR) 250-50 MCG/DOSE AEPB Inhale 1 puff into the lungs every 12 (twelve) hours.    Marland Kitchen glucose blood (ONETOUCH VERIO) test strip Test sugars 3 times daily 100 each 12  . hydrOXYzine (ATARAX/VISTARIL) 25 MG tablet Take 25 mg by mouth 3 (three) times daily as needed.    . insulin detemir (LEVEMIR) 100 UNIT/ML injection Inject 0.08 mLs (8 Units total) into the skin at bedtime. (Patient taking differently: Inject 6 Units into the skin at bedtime. ) 10 mL 1  . Insulin Pen Needle (UNIFINE PENTIPS) 31G X 8 MM MISC Use with sliding scale novolog and levemir insulins. 100 each 3  . metFORMIN (GLUCOPHAGE) 500 MG tablet Take 1 tablet (500 mg total) by mouth 2 (two) times daily with a meal. Two times a day with meal. (Patient taking differently: Take 500 mg by mouth daily with breakfast. Two times a day with meal.) 180 tablet 3  . montelukast (SINGULAIR) 10 MG tablet Take 10 mg by mouth at bedtime.    . Multiple Vitamin (MULTIVITAMIN) tablet Take 1 tablet by mouth daily.    Glory Rosebush DELICA LANCETS 09Q MISC Test sugars three times daily 100 each 11  .  predniSONE (DELTASONE) 20 MG tablet Take 20 mg by mouth 2 (two) times daily with a meal.    . rivaroxaban (XARELTO) 10 MG TABS tablet Take 15 mg by mouth daily.     . sotalol (BETAPACE) 80 MG tablet Take 160 mg by mouth 2 (two) times daily.     . theophylline (THEODUR) 200 MG 12 hr tablet Take 200 mg by mouth 2 (two) times daily.    Marland Kitchen tiotropium (SPIRIVA) 18 MCG inhalation capsule Place 18 mcg into inhaler and inhale daily.     No current facility-administered medications for this visit.    Functional Status:   In your present state of health, do you have any difficulty performing the following activities: 10/14/2014  Hearing? N  Vision? N  Difficulty  concentrating or making decisions? N  Walking or climbing stairs? Y  Dressing or bathing? N  Doing errands, shopping? Y  Preparing Food and eating ? N  Using the Toilet? N  In the past six months, have you accidently leaked urine? N  Do you have problems with loss of bowel control? N  Managing your Medications? Y  Managing your Finances? N  Housekeeping or managing your Housekeeping? Y    Fall/Depression Screening:    PHQ 2/9 Scores 10/14/2014 10/14/2014 07/27/2013 07/10/2012  PHQ - 2 Score _0 0  PHQ- 9 Score 12 - 5 -   [facility] CM Care Plan Problem One        Patient Outreach from 12/14/2014 in [place] Problem One  Pt wants to increase her strength to be able to travel with her children   Care Plan for Problem One  Active   [facility] Long Term Goal (31-90 days)  Pt will not be addmittd to the hospital with COPD related symptoms in the next 90days.   [facility] Long Term Goal Start Date  10/14/14   Interventions for Problem One Long Term Goal  Discussed with pt the disease zones related to COPD and when to cal the MD. Pharmacy consult to assist with medication management  and adherence.   [facility] CM Short Term Goal #1 (0-30 days)  Pt will visit endocrinologist in the next 30 days.   [facility] CM Short Term Goal #1 Start Date  11/14/14   [facility] CM Short Term Goal #1 Met Date  12/14/14   Interventions for Short Term Goal #1  Call to make an appointment with the dr.    Margie Billet CM Short Term Goal #2 (0-30 days)  Pt will check am blood sugar every day until the endocrinology appointment    [facility] CM Short Term Goal #2 Start Date  10/14/14   [facility] CM Short Term Goal #2 Met Date  11/14/14   Interventions for Short Term Goal #2  Discussed the importance of the endocrinologist having data to be able to adjust medications.    [facility] CM Short Term Goal #3 (0-30 days)  Pt will attend endocrinology appointment with in the next 30 days to obtain a new glucometer and diabetic plan   [facility] CM Short Term Goal #3 Start  Date  11/14/14   [facility] CM Short Term Goal #3 Met Date  12/14/14   Interventions for Short Tern Goal #3  previous appointment cancelled but rescheduled by [facility] at request of pt. Reinforced the importance of having sugars regulated and obtaining a new meter.   [facility] CM Short Term Goal #4 (0-30 days)  Pt will exercise 3x per week  for the next 30 days to help increase strength   [facility] CM Short Term Goal #4 Start Date  11/14/14   [facility] CM Short Term Goal #4 Met Date  12/14/14   Interventions for Short Term Goal #4  Discussed the importance of exercise in treating mild depression. Education sheet provided on depression stressing the importance of exercise. Also discussed the importance of exercise in the endurance process.    [facility] CM Short Term Goal #5 (0-30 days)  Pt will follow instructions given by Endocrinologist in hopes to come off insulin in the next 30days   [facility] CM Short Term Goal #5 Start Date  12/14/14   Interventions for Short Term Goal #5  RNCM attended Edndocrinology appt with pt. RNCM will make a detailed calendar listing when fingersticks need to be obtained. Pt will report to MD a pattern of sugars greater than 250 for 3 consecutive days.    [facility] CM Care Plan Problem Two        Patient Outreach from 10/26/2014 in [place] Problem Two  Medication Adherence   Care Plan for Problem Two  Active   Interventions for Problem Two Long Term Goal   Ms. Hodder was given a pill box to assist with compliance.  She was educated on adherence and instructed to keep the Levemir beside her meter so she would not forget it.  She was also instructed to take her Spiriva daily as well.    [facility] Long Term Goal (31-90) days  Ms. Molesworth will be adherent to all her medications evidence by patient report over the next 90 days   [facility] Long Term Goal Start Date  10/14/14   [facility] CM Short Term Goal #1 (0-30 days)  Ms. Foutz will use her Spiriva daily by evidence of patient report over the next 30 days.     [facility] CM Short Term Goal #1 Start Date  10/14/14   Interventions for Short Term Goal #2   She was educated on adherence and she was instructed to take her Spiriva daily.    [facility] CM Short Term Goal #2 (0-30 days)  Ms. Holst will take her Levemir daily as evidence by patient report over the next 30 days.   [facility] CM Short Term Goal #2 Start Date  10/14/14   Interventions for Short Term Goal #2  Ms. Christner was educated on adherence and she was instructed to place the Levemir by her meter to help her remember to take it.      Assessment: COPD: Pt in good spirits, alert and oriented x3. Lungs with noted upper airway ronchi that clears with cough. Pt reports no increase in mucus production, but does have a productive cough daily. Reports she is taking all breathing medications as prescribed. Pt conts to use O2 as needed and for outings. Pt reports being able to go to [place] and the grocery store since [facility] last visit and reports a small increase in strength. RNCM called pulmonologist's office to assist pt in clarifying new medication instructions. Dr. Joanell Rising nurse [name], was able to provide information stating that the new medication was  Tobramycin used in the pt's nebulizer. [name] informed RNCM the medication was special order and a specialty pharmacy would contact the pt for shipping instructions. [name] also stated the medication instructions included to use medication every 12 hours for 1 month and then take a 1 month break then begin med again. [name] also informed RNCM that the medication comes  in a 40m vial which is a longer nebulizer treatment than the pt is used to so be aware. ACaryl Pinainformed RNCM the name of the pharmacy "[facility]" which would be calling the pt so she could safely give information to the correct company. RNCM discussed these details with the pt, she wrote down the name of the medicine and pharmacy and verbalized understanding.   DM: Pt noted to have completed all random blood sugar  checks as requested by the endocrinologist. RNCM unable to retrieve averages related to the fact pt had switched meters x3. She did not like the meter given to her by the MD so she obtained one from her pharmacy and then reverted back to using original older meter. Pt has an appt today with [facility]inologist [facility] Skin on both feet checked per diabetic protocol, Left foot conts to have small pencil-eraser sized black area looking like a "blood blister" or an imbedded object. There is no redness, warmth, drainage, or pain to the area and there has been no change in the area since the previous 2 assessments. The area has been evaluated by an MD. Right foot with skin intact. Pt reported taking Levimir 6units per night as the MD had requested for the last 3 weeks.  HF: Pt heart sounds and b/p wnl. Pt denies an increase in SOB. Extremities warm with +2pulses to bilateral feet. Left foot and ankle with +1 edema and right foot noted to be slightly puffy with pt reporting " My feet were so swollen and purple 4 days ago so I started taking my Lasix again and they have went down." Pt stated she has noticed a weight loss since her last severe illness in July 2015. She weighs daily and stated her average weight is appox. 104lbs. Pt stated she sees her cardiologist this upcoming Monday and will discuss her swollen feet and Lasix.   Musculoskeletal: Pt complains of being unable to get from sitting to standing easily. Pt states her hips feel weak and it is difficult for her to get out of her recliner and especially her car. Pt also continues to complain of dizziness upon standing. No redness or bruising noted. Will discuss with pt the possibility of PT or silver sneakers to assist her with lower extremity strength. Denies pain.    Plan: RNCM will attend endocrinologist appt with pt this am. RNCM will check in on pt telephonically to make sure she has heard from specialty pharmacy. RNCM will  route this encounter to primary care MD for review.  RNCM plans to see pt in one month for routine home visit.   JRutherford LimerickRN, BSN  [facility] ([phone]

## 2014-12-14 NOTE — Patient Instructions (Signed)
Check sugars 2 x daily ( before breakfast, before lunch and 2 hour lunch and before supper, bedtime).  Record them in a log book and bring that/meter to next appointment.   Stop Levemir today. Continue current metformin.  Please find out whether Venezuelajanuvia, tradjenta or onglyza.  If sugars stay elevated over 250, then please give me a call for further adjustments.   Please come back for a follow-up appointment in 2 weeks

## 2014-12-14 NOTE — Progress Notes (Signed)
Pre visit review using our clinic review tool, if applicable. No additional management support is needed unless otherwise documented below in the visit note. 

## 2014-12-15 ENCOUNTER — Encounter: Payer: Self-pay | Admitting: *Deleted

## 2014-12-15 DIAGNOSIS — E119 Type 2 diabetes mellitus without complications: Secondary | ICD-10-CM

## 2014-12-15 NOTE — Assessment & Plan Note (Signed)
Recent A1c quite good.  Recent sugars in the morning are lower on current dose of levemir.  Discussed that her goal sugars could be higher given her age and comorbidities.  She really wants to try get off the insulin at this time- we dicussed options would be to temporarily hold it for the next 2 weeks and assess her sugars- if they start rising then consider adding alternative like DPPIV or increase metformin ( Cr/GFR are slightly misleading for her age group and wouldn't be keen on going upto full dose metformin).  Check sugars 2 x daily.

## 2014-12-15 NOTE — Patient Outreach (Signed)
[facility] Care Management  12/14/2014  Antoine Primasatsy C Gallo 12/28/1928 [id number]    RNCM attended endocrinology appt with pt at 11:15am on December 14, 2014. Purpose of the appt was for MD to review sugars for the last 3 weeks which the pt took randomly each day after a reduction in her Levimir from 8 units nightly to 6 units nightly and discuss the possibility of pt coming off insulin. MD discussed with pt diet and sugar alternatives. MD discussed with pt several options in continuing to control diabetes which included oral medications, a different type of insulin or try 2 weeks with no insulin to see how sugars range. MD discussed with pt the necessity of continue to check blood sugars for the next 2 weeks at different times to evaluate how she is doing with out the insulin. MD requested that RNCM find out the most affordable option of oral diabetic agent in case pt would require one after the two weeks. MD inspected small area on the pt's bottom of the pt's left foot and stated she would report area to pt's primary care MD for f/u.   After appointment RNCM and pt discussed the appointment in which the pt was pleased to not have to take the insulin for 2 weeks but not pleased about having to check her sugar twice a day for 2 weeks. Pt and RNCM discussed a plan to assist her in remembering to check her sugar BID for the next two weeks.   Plan: RNCM plans to make a calendar with instructions for the next two weeks assisting pt with remembering to check sugars.  RN[facility] plans [facility] about the medications MD discussed  RNCM plans to attend next visit with pt on June 15 at 10:30 am.   Ma RingsJanci Biff Rutigliano RN, BSN  [facility] Care Management [phone])

## 2014-12-28 ENCOUNTER — Encounter: Payer: Self-pay | Admitting: Endocrinology

## 2014-12-28 ENCOUNTER — Ambulatory Visit (INDEPENDENT_AMBULATORY_CARE_PROVIDER_SITE_OTHER): Payer: Medicare Other | Admitting: Endocrinology

## 2014-12-28 VITALS — BP 102/56 | HR 68 | Ht 62.0 in | Wt 103.3 lb

## 2014-12-28 DIAGNOSIS — E119 Type 2 diabetes mellitus without complications: Secondary | ICD-10-CM | POA: Diagnosis not present

## 2014-12-28 NOTE — Assessment & Plan Note (Addendum)
Recent A1c quite good.  Discussed that her goal sugars could be higher given her age and comorbidities.  She really wants to try get off the insulin at this time- here recent sugars are mostly at target. She can continue to stay off insulin a this time.  She will decrease her sugar checks to 1 x daily at various times of the day and fax log in few weeks to Dr Lorin Picket. Case reviewed with Dr Lorin Picket as well- if sugars start rising then consider adding alternative like DPPIV ( her plan covers all available options in this class equally- she gets her meds at [facility]  Continue current metformin She is aware that if she needs higher doses of prednisone, then she will have to get restarted on levemir, atleast temporarily.

## 2014-12-28 NOTE — Patient Instructions (Signed)
Check sugars once daily- at various times of the day.  Continue metformin.  Okay to stay off levemir. If sugars are staying over 240, then call us- might try low dose DPPIv oral tablet then.  If needing higher dose of steroids , then probably would need to go back on basal insulin while on higher doses of steroids.   Please come back for a follow-up appointment in 1 month. Follow back with Dr Lorin Picket

## 2014-12-28 NOTE — Progress Notes (Signed)
Pre visit review using our clinic review tool, if applicable. No additional management support is needed unless otherwise documented below in the visit note. 

## 2014-12-28 NOTE — Progress Notes (Signed)
Reason for visit-  Carla Harris is a 81 y.o.-year-old female, for management of Type 2 diabetes, uncontrolled, without complications. Here with [facility] RN.  Last visit June 2016.   HPI- Patient has been diagnosed with diabetes in few years back.   Tried  Metformin, levemir and Novolog. she has  been on insulin since 2015.   * patient was hospitalized 2015 and has been on insulin. Lately only on basal insulin. *[facility] other [facility] *Lives with son *self sufficient with ADL, on Home O2 at night time and during activity * For COPD had needed freq steroids and recently taking prednisone 20 mg daily ( hasn't been lower than 20 mg daily dose recently). Pulm- Dr Meredeth Ide. Recently completed course of abx. Still with congestion , going to start on tobramycin inhaler   Pt is currently on a regimen of: - Metformin 500 mg po bid - Levemir 6 units qhs (decreased since last time)>>on hold since past few weeks  * tired of taking shots and wants to get off the insulin if possible   Last hemoglobin A1c was: Lab Results  Component Value Date   HGBA1C 7.8* 11/22/2014   HGBA1C 6.9* 12/23/2012     Pt checks her sugars 2 a day . Uses ? glucometer. By log they are:   PREMEAL Breakfast Lunch Dinner Bedtime Overall  Glucose range:     123-219 mostly  Mean/median:        POST-MEAL PC Breakfast PC Lunch PC Dinner  Glucose range:     Mean/median:      *few higher readings after dietary indiscretions- she loves her sweets, and after Sunday family dinner time  Hypoglycemia-  No recent lows. Lowest sugar was 118; she hasn't hypoglycemia awareness at 72.   Dietary habits- eats three times daily. Tries to limit carbs, sweetened beverages, sodas, desserts. Eats white bread toast for BF-Loves desserts and pies. Other foods include-graham crackers, can soup, fried chicken breasts>>trying to do better with diet Exercise- active around the house Weight -  Wt Readings  from Last 3 Encounters:  12/28/14 103 lb 4.8 oz (46.857 kg)  12/14/14 104 lb 8 oz (47.401 kg)  11/22/14 104 lb 4 oz (47.287 kg)    Diabetes Complications-  Nephropathy- No  CKD, last BUN/creatinine-   Lab Results  Component Value Date   BUN 18 07/01/2014   CREATININE 0.6 07/01/2014   Lab Results  Component Value Date   GFR 94.15 07/01/2014   Lab Results  Component Value Date   MICRALBCREAT 1.1 12/23/2012     Retinopathy- No, Last DEE was in 2 years ago, prior cataract surgery Neuropathy- no numbness and tingling in )her feet. No known neuropathy.  Associated history - No CAD -has afib. No prior stroke. No hypothyroidism. her last TSH was  Lab Results  Component Value Date   TSH 1.586 07/04/2014    Hyperlipidemia-  her last set of lipids were- Currently was supposed to be pravastatin ( -stopped by PCP in setting of hospital recovery 2015). Has been off since past months Lab Results  Component Value Date   CHOL 249* 07/01/2014   HDL 43.80 07/01/2014   LDLCALC 169* 07/01/2014   LDLDIRECT 139.1 12/23/2012   TRIG 182.0* 07/01/2014   CHOLHDL 6 07/01/2014    Blood Pressure/HTN- Patient's blood pressure is well controlled today on current regimen that includes ACE-I/ARB.   I have reviewed the patient's past medical history,  medications and allergies.  Current Outpatient Prescriptions on File Prior to Visit  Medication Sig Dispense Refill  . acetylcysteine (MUCOMYST) 20 % nebulizer solution Take 2 mLs by nebulization 2 (two) times daily.    Marland Kitchen albuterol (PROVENTIL HFA;VENTOLIN HFA) 108 (90 BASE) MCG/ACT inhaler Inhale 1 puff into the lungs every 6 (six) hours as needed for wheezing or shortness of breath.    Marland Kitchen albuterol (PROVENTIL) (2.5 MG/3ML) 0.083% nebulizer solution Inhale into the lungs.    Marland Kitchen albuterol-ipratropium (COMBIVENT) 18-103 MCG/ACT inhaler Inhale 2 puffs into the lungs every 6 (six) hours as needed.    . cetirizine (ZYRTEC) 10 MG tablet Take 10 mg by mouth  daily.    . Cholecalciferol (VITAMIN D3) 2000 UNITS capsule Take 2,000 Units by mouth daily.    Marland Kitchen esomeprazole (NEXIUM) 40 MG capsule Take 40 mg by mouth daily before breakfast. Take 1 capsule twice a day    . Fluticasone-Salmeterol (ADVAIR) 250-50 MCG/DOSE AEPB Inhale 1 puff into the lungs every 12 (twelve) hours.    Marland Kitchen glucose blood (ONETOUCH VERIO) test strip Test sugars 3 times daily 100 each 12  . hydrOXYzine (ATARAX/VISTARIL) 25 MG tablet Take 25 mg by mouth 3 (three) times daily as needed.    . Insulin Pen Needle (UNIFINE PENTIPS) 31G X 8 MM MISC Use with sliding scale novolog and levemir insulins. 100 each 3  . metFORMIN (GLUCOPHAGE) 500 MG tablet Take 1 tablet (500 mg total) by mouth 2 (two) times daily with a meal. Two times a day with meal. (Patient taking differently: Take 500 mg by mouth daily with breakfast. Two times a day with meal.) 180 tablet 3  . montelukast (SINGULAIR) 10 MG tablet Take 10 mg by mouth at bedtime.    . Multiple Vitamin (MULTIVITAMIN) tablet Take 1 tablet by mouth daily.    Letta Pate DELICA LANCETS 33G MISC Test sugars three times daily 100 each 11  . predniSONE (DELTASONE) 20 MG tablet Take 20 mg by mouth 2 (two) times daily with a meal.    . rivaroxaban (XARELTO) 10 MG TABS tablet Take 15 mg by mouth daily.     . sotalol (BETAPACE) 80 MG tablet Take 160 mg by mouth 2 (two) times daily.     . theophylline (THEODUR) 200 MG 12 hr tablet Take 200 mg by mouth 2 (two) times daily.    . insulin detemir (LEVEMIR) 100 UNIT/ML injection Inject 0.08 mLs (8 Units total) into the skin at bedtime. (Patient not taking: Reported on 12/28/2014) 10 mL 1   No current facility-administered medications on file prior to visit.   Allergies  Allergen Reactions  . Advil [Ibuprofen] Swelling  . Ciprofloxacin   . Levaquin [Levofloxacin In D5w]   . Penicillins   . Septra [Sulfamethoxazole-Trimethoprim] Other (See Comments)    Itching and swelling of the face and hands  . Sulfa  Antibiotics   . Doxycycline Itching    Review of Systems- [ x ]  Complains of    [  ]  denies [  ] nausea/vomiting [  ] diarrhea [  ] constipation [  ] abdominal pain [ x ]  Stable sob [  ]  concern with feet ( wounds/sores) or leg swelling    PE: BP 102/56 mmHg  Pulse 68  Ht  (1.575 m)  Wt 103 lb 4.8 oz (46.857 kg)  BMI 18.89 kg/m2  SpO2 95%  LMP 07/01/1969 Wt Readings from Last 3 Encounters:  12/28/14 103 lb 4.8 oz (46.857 kg)  12/14/14  104 lb 8 oz (47.401 kg)  11/22/14 104 lb 4 oz (47.287 kg)   Exam: deferred  ASSESSMENT AND PLAN: Problem List Items Addressed This Visit      Endocrine   Diabetes mellitus - Primary    Recent A1c quite good.  Discussed that her goal sugars could be higher given her age and comorbidities.  She really wants to try get off the insulin at this time- here recent sugars are mostly at target. She can continue to stay off insulin a this time.  She will decrease her sugar checks to 1 x daily at various times of the day and fax log in few weeks to Dr Lorin Picket. Case reviewed with Dr Lorin Picket as well- if sugars start rising then consider adding alternative like DPPIV ( her plan covers all available options in this class equally- she gets her meds at [facility]).  Continue current metformin She is aware that if she needs higher doses of prednisone, then she will have to get restarted on levemir, atleast temporarily.                   - Return to clinic in 4 weeks with sugar log/meter. If any change in small wound on left foot, call PCP for appt. She was also asked to follow back with Dr Lorin Picket for her regular care, since Dr Roby Lofts next available is in Sept and I think the patient can wait till then, they will drop off sugars in few weeks for her review.   Bevely Hackbart [facility] 12/28/2014 1:18 PM

## 2014-12-29 ENCOUNTER — Encounter: Payer: Self-pay | Admitting: *Deleted

## 2014-12-29 NOTE — Patient Outreach (Signed)
[facility]) Care Management  12/29/2014  Carla Harris [birth year] [id number]   RNCM attended endocrinology appointment with pt on 12/28/14 @ 10:30. Pt had obtained sugars randomly BID as directed by Dr. Welford Roche. Pt reported she had not been using her levimir as directed by Dr. Welford Roche. MD stated pt could remain off the levimir but requested she check her sugar at random times during the day once a day to ensure they remained stable. MD also requested pt follow up with primary care MD and report sugars to that MD.  [facility] congratulated pt on being able to come off her levimir, which was pt's goal related to cost and the pain of the injection. Pt stated she was extremely happy she was able to stop taking the shot.   Plan: RNCM will see pt at her already scheduled routine home visit.  Costella Hatcher RN, BSN  [facility] Care Management [phone])

## 2014-12-30 ENCOUNTER — Other Ambulatory Visit: Payer: Self-pay | Admitting: Pharmacist

## 2014-12-30 ENCOUNTER — Encounter: Payer: Self-pay | Admitting: *Deleted

## 2014-12-30 ENCOUNTER — Other Ambulatory Visit: Payer: Self-pay | Admitting: *Deleted

## 2014-12-30 NOTE — Patient Outreach (Signed)
[facility]) Care Management  [place]   12/30/2014  Carla Harris Feb 21, 1929 [id number]  Subjective:  Received a call from Nurse Care Manager Janci Minor stating that Ms. Carla Harris had just received a new medication, tobramycin nebulizer solution, in the mail and was concerned because she did not know how to administer it. Agreed to join [place] and met with [name] in her home today.   Ms. Carla Harris reports that her pulmonologist ordered this medication several weeks ago and that it arrived in the mail today. Counseled [name] that the medication must be stored in the fridge and protected from light, one nebule should be removed from a package at a time. Also counseled patient that if she needs to use her albuterol nebulizer as well, she should administer it first and that she should not mix the tobramycin with other nebulizer medications. Reviewed potential side effects with Ms. Carla Harris.  While in the patients home, note that the tobramycin solution packaging information states that it is specifically formulated for inhalation using a PARI LC PLUS Reusable Nebulizer and a DeVilbiss Pulmo-Aide air compressor. Note that Ms. Carla Harris instead has a Investment banker, corporate system. I am unable to find any information supporting using the tobramycin in the Pulmoneb Nebulizer. Also, Ms. Carla Harris expresses that she would prefer to start this new medication on Monday, rather than over the weekend.   While speaking with Ms. Carla Harris, she also reports that she was told by [name] at her Pulmonologist, Dr. Leane Platt, office that she was no longer supposed to be taking Spiriva, but was not sure why. Reports that she was told that it was because of her Combivent. Ms. Don is only using her Combivent inhaler as needed at this time.  While with [name], placed a call to Dr. Leane Platt office to determine: 1) If the patient was to be taken off of the Spiriva and if off Spiriva, should she be  taking the Combivent on a scheduled basis. 2) Discuss whether Ms. Carla Harris requires a different type of nebulizer for the tobramycin treatment, as the one that she has is not one of the ones approved for use with tobramycin.  Left a message with [name] at the front desk at Dr. Leane Platt office. She reports that Dr. Raul Del will not be back until Monday and that I will receive a call back then. Spoke with [name] about waiting unitl Monday to start the tobramycin nebulization. She is agreeable, as it is also her preference to not start this new medication on a weekend.  Objective:   Current Medications: Current Outpatient Prescriptions  Medication Sig Dispense Refill   acetylcysteine (MUCOMYST) 20 % nebulizer solution Take 2 mLs by nebulization 2 (two) times daily.     albuterol (PROVENTIL HFA;VENTOLIN HFA) 108 (90 BASE) MCG/ACT inhaler Inhale 1 puff into the lungs every 6 (six) hours as needed for wheezing or shortness of breath.     albuterol (PROVENTIL) (2.5 MG/3ML) 0.083% nebulizer solution Inhale into the lungs.     albuterol-ipratropium (COMBIVENT) 18-103 MCG/ACT inhaler Inhale 2 puffs into the lungs every 6 (six) hours as needed.     cetirizine (ZYRTEC) 10 MG tablet Take 10 mg by mouth daily.     Cholecalciferol (VITAMIN D3) 2000 UNITS capsule Take 2,000 Units by mouth daily.     esomeprazole (NEXIUM) 40 MG capsule Take 40 mg by mouth daily before breakfast. Take 1 capsule twice a day     Fluticasone-Salmeterol (ADVAIR) 250-50 MCG/DOSE AEPB Inhale  1 puff into the lungs every 12 (twelve) hours.     glucose blood (ONETOUCH VERIO) test strip Test sugars 3 times daily 100 each 12   hydrOXYzine (ATARAX/VISTARIL) 25 MG tablet Take 25 mg by mouth 3 (three) times daily as needed.     insulin detemir (LEVEMIR) 100 UNIT/ML injection Inject 0.08 mLs (8 Units total) into the skin at bedtime. (Patient not taking: Reported on 12/28/2014) 10 mL 1   Insulin Pen Needle (UNIFINE PENTIPS) 31G X  8 MM MISC Use with sliding scale novolog and levemir insulins. 100 each 3   metFORMIN (GLUCOPHAGE) 500 MG tablet Take 1 tablet (500 mg total) by mouth 2 (two) times daily with a meal. Two times a day with meal. (Patient taking differently: Take 500 mg by mouth daily with breakfast. Two times a day with meal.) 180 tablet 3   montelukast (SINGULAIR) 10 MG tablet Take 10 mg by mouth at bedtime.     Multiple Vitamin (MULTIVITAMIN) tablet Take 1 tablet by mouth daily.     ONETOUCH DELICA LANCETS 27C MISC Test sugars three times daily 100 each 11   predniSONE (DELTASONE) 20 MG tablet Take 20 mg by mouth 2 (two) times daily with a meal.     rivaroxaban (XARELTO) 10 MG TABS tablet Take 15 mg by mouth daily.      sotalol (BETAPACE) 80 MG tablet Take 160 mg by mouth 2 (two) times daily.      theophylline (THEODUR) 200 MG 12 hr tablet Take 200 mg by mouth 2 (two) times daily.     No current facility-administered medications for this visit.    Functional Status: In your present state of health, do you have any difficulty performing the following activities: 10/14/2014  Hearing? N  Vision? N  Difficulty concentrating or making decisions? N  Walking or climbing stairs? Y  Dressing or bathing? N  Doing errands, shopping? Y  Preparing Food and eating ? N  Using the Toilet? N  In the past six months, have you accidently leaked urine? N  Do you have problems with loss of bowel control? N  Managing your Medications? Y  Managing your Finances? N  Housekeeping or managing your Housekeeping? Y    Fall/Depression Screening: PHQ 2/9 Scores 10/14/2014 10/14/2014 07/27/2013 07/10/2012  PHQ - 2 Score _0 0  PHQ- 9 Score 12 - 5 -    Assessment:  Need clarification from Dr. Gust Brooms office about whether the patient should be using Spiriva and, if not, whether she should be using the Combivent on a scheduled basis. Also need to discuss the nebulizer to be used for the tobramycin nebulization  solution.  Plan:  Have left a message with Dr. Gust Brooms office. If have not heard back by noon on 01/02/15, will follow up again at that time.  Harlow Asa, PharmD Clinical Pharmacist [facility] [phone]

## 2014-12-30 NOTE — Patient Outreach (Signed)
Contacted Pharmacist E. Dhalia Pharm-D about pt's new medication. She agreed to meet me at pt's home to assist with instructing pt about medication.  Costella Hatcher RN, BSN  [facility] Care Management [phone])

## 2014-12-30 NOTE — Patient Outreach (Signed)
[facility]) Care Management   12/30/2014  Carla Harris 1928/07/18 [id number]  Carla Harris is an 81 y.o. female  Subjective: "I went to the grocery store today and I have felt so tight and tired since then." " I have taken my nebulizer but I still feel tight with my breathing and I am nervous to start this new treatment yet"  Objective: Pulse 76, resp. rate 20, SpO2 94 % with out 02 on.  ROS deferred.  Physical Exam  Respiratory: She has rhonchi.  Lungs with scattered rhonchi throughout lung fields. Congested cough noted with poss sputum production, white/clear in color.   Assessment: Pt noted to be SOB with minimal exertion using O2 intermittently during the visit. RNCM suggested that pt use prn nebulizer of albuterol again as ordered for SOB and discomfort. Pt stated she would. RNCM and Pharmacist Deatra Canter present to explain use of newly delivered antibiotic nebulizer solution. Pharmacist placed care coordination call to pulmonologist but received message he would be out of the office until Monday. Pharmacist voiced she would follow up on Monday.  Plan: RNCM will report to Jodi Mourning RN CCM details related to pt's new medication so she will be aware if further questions arise.  Pharmacist to follow up with pt Monday.  Carla Hatcher RN, BSN  [facility] Care Management [phone])

## 2014-12-30 NOTE — Patient Outreach (Signed)
RNCM received a call form pt related to new medication pulmonologist had ordered for her nebulizer. She had placed a call to her pulmonologist but was unable to reach him. RNCM talked with pt about the new medication but RNCM explained to pt that I needed to request information from the pharmacist to be clear on the instructions and I would come by to see her once I had clarification.  Plan: RNCM will call [facility] pharmacist for clarification of directions, side effects, storage, and use. RNCM will go to pt's home and assist her with new medication.   Costella Hatcher RN, BSN  [facility] Care Management [phone])

## 2015-01-02 ENCOUNTER — Other Ambulatory Visit: Payer: Self-pay | Admitting: Pharmacist

## 2015-01-02 NOTE — Patient Outreach (Signed)
Received a call back from Dr. Reita Cliche nurse, Morrie Sheldon. Morrie Sheldon states that Ms. Brashear was to stop her Spiriva and use only the Combivent inhaler on a scheduled basis (four times daily) in its place.   Morrie Sheldon states that she still has to ask Dr. Meredeth Ide about the nebulizer and the tobramycin. Reports that she will call me back again today as soon as she has spoken with him.  Duanne Moron, PharmD Clinical Pharmacist [facility] Care Management [phone]

## 2015-01-02 NOTE — Patient Outreach (Signed)
Called and left a message with Dr. Welton Flakes to confirm whether Ms. Simmon was to have stopped her Lasix and, if so, to find out why.  Ms. Pinkerman states that she was instructed by Dr. Welton Flakes at her last visit with him to stop taking her Lasix. However, Ms. Graff reports that she has still been taking it occasionally when she has swelling, as she does not know why he told her to stop.  If have not heard back from Dr. Welton Flakes by 01/04/15, will follow up with him again at that time.  Duanne Moron, PharmD Clinical Pharmacist [facility] Care Management [phone]

## 2015-01-02 NOTE — Patient Outreach (Signed)
Called to follow up with Ms. Carla Harris. Reports that her chest is still as tight as it has been. Reports that she is managing this by sitting down frequently. Reports that she has been using her albuterol nebulizer and that it has been helping. Reports that she did use her Combivent this morning at 8 AM. Counseled Ms. Carla Harris that per Dr. Meredeth Harris, she should be using the Combivent four times daily on a scheduled basis, but no longer be using the Spiriva. Ms. Carla Harris verbalized understanding.  Let Ms. Carla Harris know that I am still waiting to hear back from Dr. Meredeth Harris about what nebulizer she is to use for her tobramycin nebulization solution.   Ms. Carla Harris states that she was instructed by Dr. Welton Harris at her last visit with him to stop taking her Carla Harris. However, Ms. Carla Harris reports that she has still been taking it occasionally when she has swelling, as she does not know why he told her to stop. Ms. Carla Harris provided me with Dr. Milta Harris phone number [phone]) and I will follow up with him today about this change.  Carla Harris, PharmD Clinical Pharmacist [facility] Care Management [phone]

## 2015-01-04 ENCOUNTER — Other Ambulatory Visit: Payer: Self-pay | Admitting: Pharmacist

## 2015-01-04 NOTE — Patient Outreach (Signed)
Called to follow up with Carla Harris. Patient states that she received a call back from Morrie Sheldon, a nurse at Dr. Reita Cliche office, who instructed her that Dr. Meredeth Ide is unexpectedly out of the office until Wednesday, but that [name] should go ahead and use the tobramycin nebulization solution in the nebulizer that she already has at home. Per [name], Morrie Sheldon said that she would call [name] back again on Wednesday once Dr. Meredeth Ide is back in the office.  As the patient was instructed to start the tobramycin in her current nebulizer, reviewed with her administration and storage directions for the solution.  Duanne Moron, PharmD Clinical Pharmacist [facility] [phone]

## 2015-01-04 NOTE — Patient Outreach (Signed)
Called to follow up with Ms. Carla Harris to see how she is feeling. Reports that she is still feeling tight, as she has been, especially when she is active. Reports that she just used her albuterol nebulizer after being up and making a batch of cornbread. Reports that she did use her tobramycin last night at 7 pm and again this morning at 7 am. Reports that she has used her Combivent twice today and is trying to make sure that she uses is about every 6 hours. Reminded Ms. Carla Harris that I will be out of town until next week, but advised patient to contact Dr. Reita Cliche office or [facility] if she needs nursing or pharmacy assistance. Verified that Ms. Carla Harris has the main [facility] number.  Will follow up with Ms. Carla Harris next week after I have gotten in touch with her Cardiologist, Dr. Welton Flakes.  Duanne Moron, PharmD Clinical Pharmacist [facility] [phone]

## 2015-01-04 NOTE — Patient Outreach (Addendum)
Called to follow up about the message that I left with Dr. Welton Flakes on 01/02/15 to confirm whether Carla Harris was to have stopped her Lasix and, if so, to find out why.  Carla Harris states that she was instructed by Dr. Welton Flakes at her last visit with him to stop taking her Lasix. However, Carla Harris reports that she has still been taking it occasionally when she has swelling, as she does not know why he told her to stop.  If have not heard back from Dr. Welton Flakes by 01/09/15, will follow up with him again at that time.  Duanne Moron, PharmD Clinical Pharmacist [facility] [phone]

## 2015-01-04 NOTE — Patient Outreach (Signed)
Called to follow up with Ms. Wahlberg to see how she is feeling and to see if she has any questions about her new medication. Reports that she "might be feeling a little better, but is certainly not worse". Reports that she did use her tobramycin last night at 7 pm and again this morning at 7 am. Reports that she has only used her Combivent once this morning. Reminded patient to use the Combivent four times daily. Patient stated that she would use it now and again before bed. Reports that she couldn't remember if it was Okay to use her albuterol nebulizer solution. Reminded patient to use the albuterol nebulizer when she needs it and if needs it close to the administration time of the tobramycin, to use the albuterol first.  Duanne Moron, PharmD Clinical Pharmacist [facility] [phone]

## 2015-01-06 ENCOUNTER — Emergency Department: Payer: Medicare Other

## 2015-01-06 ENCOUNTER — Emergency Department
Admission: EM | Admit: 2015-01-06 | Discharge: 2015-01-06 | Disposition: A | Discharge status: Home / Self Care | Payer: Medicare Other | Attending: Emergency Medicine | Admitting: Emergency Medicine

## 2015-01-06 ENCOUNTER — Encounter: Payer: Self-pay | Admitting: Emergency Medicine

## 2015-01-06 ENCOUNTER — Other Ambulatory Visit: Payer: Self-pay

## 2015-01-06 ENCOUNTER — Telehealth: Payer: Self-pay | Admitting: Internal Medicine

## 2015-01-06 DIAGNOSIS — Z720 Tobacco use: Secondary | ICD-10-CM | POA: Diagnosis not present

## 2015-01-06 DIAGNOSIS — J441 Chronic obstructive pulmonary disease with (acute) exacerbation: Secondary | ICD-10-CM | POA: Diagnosis not present

## 2015-01-06 DIAGNOSIS — Z880 Allergy status to penicillin: Secondary | ICD-10-CM | POA: Insufficient documentation

## 2015-01-06 DIAGNOSIS — R0602 Shortness of breath: Secondary | ICD-10-CM | POA: Diagnosis present

## 2015-01-06 DIAGNOSIS — E119 Type 2 diabetes mellitus without complications: Secondary | ICD-10-CM | POA: Diagnosis not present

## 2015-01-06 LAB — BRAIN NATRIURETIC PEPTIDE: B NATRIURETIC PEPTIDE 5: 67 pg/mL (ref 0.0–100.0)

## 2015-01-06 LAB — CBC WITH DIFFERENTIAL/PLATELET
Basophils Absolute: 0.1 10*3/uL (ref 0–0.1)
Basophils Relative: 1 %
Eosinophils Absolute: 0.1 10*3/uL (ref 0–0.7)
Eosinophils Relative: 1 %
HEMATOCRIT: 37.6 % (ref 35.0–47.0)
HEMOGLOBIN: 11.9 g/dL — AB (ref 12.0–16.0)
Lymphocytes Relative: 6 %
Lymphs Abs: 1 10*3/uL (ref 1.0–3.6)
MCH: 25.6 pg — ABNORMAL LOW (ref 26.0–34.0)
MCHC: 31.7 g/dL — AB (ref 32.0–36.0)
MCV: 80.9 fL (ref 80.0–100.0)
MONO ABS: 0.5 10*3/uL (ref 0.2–0.9)
MONOS PCT: 3 %
Neutro Abs: 14.5 10*3/uL — ABNORMAL HIGH (ref 1.4–6.5)
Neutrophils Relative %: 89 %
Platelets: 259 10*3/uL (ref 150–440)
RBC: 4.65 MIL/uL (ref 3.80–5.20)
RDW: 17.7 % — ABNORMAL HIGH (ref 11.5–14.5)
WBC: 16.3 10*3/uL — AB (ref 3.6–11.0)

## 2015-01-06 LAB — BASIC METABOLIC PANEL
ANION GAP: 12 (ref 5–15)
BUN: 16 mg/dL (ref 6–20)
CHLORIDE: 98 mmol/L — AB (ref 101–111)
CO2: 26 mmol/L (ref 22–32)
CREATININE: 0.71 mg/dL (ref 0.44–1.00)
Calcium: 9.4 mg/dL (ref 8.9–10.3)
GFR calc Af Amer: >60 mL/min (ref >60)
GFR calc non Af Amer: >60 mL/min (ref >60)
Glucose, Bld: 174 mg/dL — ABNORMAL HIGH (ref 65–99)
Potassium: 4.4 mmol/L (ref 3.5–5.1)
SODIUM: 136 mmol/L (ref 135–145)

## 2015-01-06 LAB — TROPONIN I: Troponin I: <0.03 ng/mL (ref <0.031)

## 2015-01-06 MED ORDER — PREDNISONE 10 MG PO TABS: 10.0000 mg | ORAL_TABLET | Freq: Every day | ORAL | Status: DC

## 2015-01-06 MED ORDER — IPRATROPIUM-ALBUTEROL 0.5-2.5 (3) MG/3ML IN SOLN
3.0000 mL | Freq: Once | RESPIRATORY_TRACT | Status: AC
  Administered 2015-01-06: 3 mL via RESPIRATORY_TRACT

## 2015-01-06 MED ORDER — IPRATROPIUM-ALBUTEROL 0.5-2.5 (3) MG/3ML IN SOLN
RESPIRATORY_TRACT | Status: AC
  Filled 2015-01-06: qty 9

## 2015-01-06 MED ORDER — AZITHROMYCIN 500 MG IV SOLR
INTRAVENOUS | Status: AC
  Filled 2015-01-06: qty 500

## 2015-01-06 MED ORDER — METHYLPREDNISOLONE SODIUM SUCC 125 MG IJ SOLR
INTRAMUSCULAR | Status: AC
  Filled 2015-01-06: qty 2

## 2015-01-06 MED ORDER — METHYLPREDNISOLONE SODIUM SUCC 125 MG IJ SOLR
125.0000 mg | Freq: Once | INTRAMUSCULAR | Status: AC
  Administered 2015-01-06: 125 mg via INTRAVENOUS

## 2015-01-06 MED ORDER — AZITHROMYCIN 500 MG IV SOLR
500.0000 mg | Freq: Once | INTRAVENOUS | Status: AC
  Administered 2015-01-06: 500 mg via INTRAVENOUS

## 2015-01-06 MED ORDER — LEVOFLOXACIN IN D5W 750 MG/150ML IV SOLN
INTRAVENOUS | Status: AC
  Filled 2015-01-06: qty 150

## 2015-01-06 MED ORDER — HYDROCOD POLST-CPM POLST ER 10-8 MG/5ML PO SUER: 5.0000 mL | Freq: Two times a day (BID) | ORAL | Status: DC

## 2015-01-06 MED ORDER — LEVOFLOXACIN IN D5W 750 MG/150ML IV SOLN: 750.0000 mg | Freq: Once | INTRAVENOUS | Status: DC

## 2015-01-06 MED ORDER — AZITHROMYCIN 250 MG PO TABS: ORAL_TABLET | ORAL | Status: DC

## 2015-01-06 NOTE — Telephone Encounter (Signed)
Spoke to pt, seen in ED today for COPD exacerbation. Sent home with abx, prednisone, nebulizer and cough med. Appt scheduled for 01/10/15 with Dr. Lorin Picket. Advised to return to ED with worsening symptoms.

## 2015-01-06 NOTE — ED Notes (Signed)
Pt to ed with c/o cough, and congestion, shortness of breath for several days.  Pt with wet cough at triage.  Pt also with pursed lip breathing, use of accessory muscles and mild tachypnea.

## 2015-01-06 NOTE — ED Notes (Signed)
MD Williams at bedside.  

## 2015-01-06 NOTE — Telephone Encounter (Signed)
Pt called to let Dr.[name] know that she was seen in the er for a cold and cough .please advise where to add pt for an er follow up.Marland Kitchen

## 2015-01-06 NOTE — Discharge Instructions (Signed)

## 2015-01-06 NOTE — ED Provider Notes (Signed)
[facility] Emergency Department Provider Note     Time seen: ----------------------------------------- 12:29 PM on 01/06/2015 -----------------------------------------    I have reviewed the triage vital signs and the nursing notes.   HISTORY  Chief Complaint Cough and Shortness of Breath    HPI Carla Harris is a 81 y.o. female who presents ER for cough congestion shortness of breath the past several days. Patient does admit to history of COPD and emphysema. She is on 2 L nasal cannula O2 most of the time. Brought in with pursed lip breathing, using assess her muscles and sounding very congested. Denies any fevers or chills or chest pain. She states his been some time since her COPD has been this bad. Symptoms are moderate to severe, worse with any activity.   Past Medical History  Diagnosis Date  . Anemia   . Diabetes mellitus without complication   . Bronchiectasis   . Colon polyps   . Hypercholesteremia   . S/P AAA repair   . Right rotator cuff tear   . COPD (chronic obstructive pulmonary disease)   . Low serum IgG1 and IgM levels   . Oxygen deficiency   . Cataract     resolved with surgery    Patient Active Problem List   Diagnosis Date Noted  . CAD (coronary artery disease) 11/30/2014  . Dizziness 07/01/2014  . Fatigue 07/01/2014  . Hypoxia 04/24/2014  . Adnexal mass 04/24/2014  . Tobacco abuse 02/07/2014  . Acute and chronic respiratory failure 01/13/2014  . GERD (gastroesophageal reflux disease) 01/13/2014  . Atrial fibrillation 09/25/2013  . Edema 03/14/2013  . COPD (chronic obstructive pulmonary disease) 07/01/2012  . Bronchiectasis 07/01/2012  . Hypercholesterolemia 07/01/2012  . Anemia 07/01/2012  . History of colon polyps 07/01/2012  . Diabetes mellitus 07/01/2012  . Osteoporosis 07/01/2012    Past Surgical History  Procedure Laterality Date  . Appendectomy    . Abdominal hysterectomy    . Abdominal aortic  aneurysm repair    . Rotator cuff repair      Allergies Advil; Ciprofloxacin; Levaquin; Penicillins; Septra; Sulfa antibiotics; and Doxycycline  Social History History  Substance Use Topics  . Smoking status: Current Every Day Smoker    Types: Cigarettes  . Smokeless tobacco: Never Used     Comment: Has decreased to about 5 a day  . Alcohol Use: No    Review of Systems Constitutional: Negative for fever. Eyes: Negative for visual changes. ENT: Negative for sore throat. Cardiovascular: Negative for chest pain. Respiratory: Positive shortness of breath and cough Gastrointestinal: Negative for abdominal pain, vomiting and diarrhea. Genitourinary: Negative for dysuria. Musculoskeletal: Negative for back pain. Skin: Negative for rash. Neurological: Negative for headaches, focal weakness or numbness.  10-point ROS otherwise negative.  ____________________________________________   PHYSICAL EXAM:  VITAL SIGNS: ED Triage Vitals  Enc Vitals Group     BP 01/06/15 1218 117/74 mmHg     Pulse Rate 01/06/15 1218 72     Resp 01/06/15 1218 24     Temp 01/06/15 1218 97.6 F (36.4 C)     Temp Source 01/06/15 1218 Oral     SpO2 01/06/15 1218 90 %     Weight 01/06/15 1218 104 lb (47.174 kg)     Height 01/06/15 1218 5' (1.524 m)     Head Cir --      Peak Flow --      Pain Score 01/06/15 1219 8     Pain Loc --  Pain Edu? --      Excl. in GC? --     Constitutional: Alert and oriented. Mild distress Eyes: Conjunctivae are normal. PERRL. Normal extraocular movements. ENT   Head: Normocephalic and atraumatic.   Nose: No congestion/rhinnorhea.   Mouth/Throat: Mucous membranes are moist.   Neck: No stridor. Hematological/Lymphatic/Immunilogical: No cervical lymphadenopathy. Cardiovascular: Normal rate, regular rhythm. Normal and symmetric distal pulses are present in all extremities. No murmurs, rubs, or gallops. Respiratory: Mild tachypnea, purse lip breathing,  mild retractions. Breath sounds with bilateral wheezing and rhonchi. Gastrointestinal: Soft and nontender. No distention. No abdominal bruits. There is no CVA tenderness. Musculoskeletal: Nontender with normal range of motion in all extremities. No joint effusions.  No lower extremity tenderness nor edema. Neurologic:  Normal speech and language. No gross focal neurologic deficits are appreciated. Speech is normal. No gait instability. Skin:  Skin is warm, dry and intact. No rash noted. Psychiatric: Mood and affect are normal. Speech and behavior are normal. Patient exhibits appropriate insight and judgment. ____________________________________________  EKG: Interpreted by me. Normal sinus rhythm with a rate of 70, anterior T-wave inversions, normal PR interval, normal QRS with, borderline long QT interval. No evidence of acute infarction.  ____________________________________________  ED COURSE:  Pertinent labs & imaging results that were available during my care of the patient were reviewed by me and considered in my medical decision making (see chart for details). Patient given DuoNeb, IV Solu-Medrol, chest x-ray and labs. We will reevaluate  Patient sound some better after the initial nebulizer treatment, will need at least one if not 2 more. No obvious pneumonia on x-ray ____________________________________________    LABS (pertinent positives/negatives)  Labs Reviewed  CBC WITH DIFFERENTIAL/PLATELET - Abnormal; Notable for the following:    WBC 16.3 (*)    Hemoglobin 11.9 (*)    MCH 25.6 (*)    MCHC 31.7 (*)    RDW 17.7 (*)    Neutro Abs 14.5 (*)    All other components within normal limits  BASIC METABOLIC PANEL - Abnormal; Notable for the following:    Chloride 98 (*)    Glucose, Bld 174 (*)    All other components within normal limits  BRAIN NATRIURETIC PEPTIDE  TROPONIN I    RADIOLOGY Images were viewed by me  X-ray  FINDINGS: Cardiac shadow is within normal  limits. The lungs are well aerated bilaterally. Mild scarring is noted in the bases bilaterally stable from the prior exam. Changes consistent with prior left proximal humeral fracture are again seen.  IMPRESSION: Chronic changes without acute abnormality. ____________________________________________  FINAL ASSESSMENT AND PLAN  COPD exacerbation  Plan: Patient given medications as above, does not want to be admitted the hospital. Will continue home with Levaquin, steroids and nebulizer treatments.   Emily Filbert, MD   Emily Filbert, MD 01/06/15 [phone]

## 2015-01-09 ENCOUNTER — Other Ambulatory Visit: Payer: Self-pay | Admitting: Pharmacist

## 2015-01-09 NOTE — Patient Outreach (Signed)
Called to follow up about the message that I left with Dr. Welton FlakesKhan to confirm whether Ms. Berber was to have stopped her Lasix and, if so, to find out why. Ms. Leonie ManRascoe states that she was instructed by Dr. Welton FlakesKhan at her last visit with him to stop taking her Lasix. However, Ms. Dowers reports that she has still been taking it occasionally when she has swelling, as she does not know why he told her to stop.  Spoke with Deanna ArtisKeisha at Dr. Milta DeitersKhan's office. Per Deanna ArtisKeisha, Ms. Shake should not have stopped the furosemide, rather she should have decreased the dose to every other day. Deanna ArtisKeisha states that Ms. Hopkin should be taking the furosemide 20mg  every other day.  Will speak with Ms. Patti to make her aware of Dr. Milta DeitersKhan's instructions.  Duanne MoronElisabeth Trevan Messman, PharmD Clinical Pharmacist [facility] [phone]

## 2015-01-09 NOTE — Patient Outreach (Signed)
Called to follow up with Ms. Greeley. Note that she was in the ED for cough and shortness of breath last Friday. Ms. Naidu reports that she is still short of breath all of the time, but that with any exercise her breathing is very limited. Short of breath when she answered the phone. Reports that she was just up in the kitchen making spaghetti sauce. Advised Ms. Soza to use her albuterol nebulization for relief.   Reports that has been using her tobramycin every day at 7 am and 7pm, but states that she is not sure if it is helping. Reports that she has used her Combivent twice today. Reminded her about the importance of using this about every 6 hours. Reports that she has also been using her Advair twice daily.  Let Ms. Dubs know that I spoke with Deanna Artis at Dr. Milta Deiters office. Per Deanna Artis, Ms. Abram should not have stopped the furosemide, rather she should have decreased the dose to every other day. Counseled Ms. Larcom that she should be taking the furosemide 20mg  every other day. Ms. Valone verbalized understanding. Advised patient to use the calendar that she keeps next to her chair to mark off every other day to help her to remember what days to take the furosemide. Ms. Marchbank reports that she has a system of turning the bottle upside down each day to help her remember.  Reports that she took the last tablet of the azithromycin today. Reports that she has a post-ED follow up appointment with Dr. Lorin Picket tomorrow.  Ms. Hinote states that she has no further medication questions at this time. Confirmed that she has my contact information for further questions. Pharmacy will stop following her for now.   Duanne Moron, PharmD Clinical Pharmacist [facility] [phone]

## 2015-01-10 ENCOUNTER — Ambulatory Visit (INDEPENDENT_AMBULATORY_CARE_PROVIDER_SITE_OTHER): Payer: Medicare Other | Admitting: Internal Medicine

## 2015-01-10 ENCOUNTER — Encounter: Payer: Self-pay | Admitting: Internal Medicine

## 2015-01-10 VITALS — BP 130/70 | HR 87 | Temp 98.2°F | Ht 62.0 in | Wt 103.5 lb

## 2015-01-10 DIAGNOSIS — J449 Chronic obstructive pulmonary disease, unspecified: Secondary | ICD-10-CM | POA: Diagnosis not present

## 2015-01-10 DIAGNOSIS — E119 Type 2 diabetes mellitus without complications: Secondary | ICD-10-CM

## 2015-01-10 DIAGNOSIS — R609 Edema, unspecified: Secondary | ICD-10-CM

## 2015-01-10 DIAGNOSIS — R0902 Hypoxemia: Secondary | ICD-10-CM

## 2015-01-10 DIAGNOSIS — Z720 Tobacco use: Secondary | ICD-10-CM

## 2015-01-10 DIAGNOSIS — I4891 Unspecified atrial fibrillation: Secondary | ICD-10-CM | POA: Diagnosis not present

## 2015-01-10 NOTE — Progress Notes (Signed)
Pre visit review using our clinic review tool, if applicable. No additional management support is needed unless otherwise documented below in the visit note. 

## 2015-01-10 NOTE — Progress Notes (Signed)
Patient ID: Carla Harris, female   DOB: October 24, 1928, 81 y.o.   MRN: [id number]   Subjective:    Patient ID: Carla Harris, female    DOB: 1928/07/19, 81 y.o.   MRN: [id number]  HPI  Patient here for an ER follow up.  Recently evaluated for COPD exacerbation.  She has severe copd.  Intermittent flares.  Worse last Friday.  To ER.  Treated with prednisone taper and abx.  Is better.  Still gets sob with increased exertion.  Unchanged.  Worse at times.  Avoids going out.  Feels she can take a deep breath now.  Some increased cough.  Productive.  No fever.  States she is eating.  Bowels stable.  Off insulin.  Sugars (per her report) - ok.  Sees Dr Meredeth Ide.  Per his note, started tobramycin nebs.     Past Medical History  Diagnosis Date  . Anemia   . Diabetes mellitus without complication   . Bronchiectasis   . Colon polyps   . Hypercholesteremia   . S/P AAA repair   . Right rotator cuff tear   . COPD (chronic obstructive pulmonary disease)   . Low serum IgG1 and IgM levels   . Oxygen deficiency   . Cataract     resolved with surgery    Current Outpatient Prescriptions on File Prior to Visit  Medication Sig Dispense Refill  . acetylcysteine (MUCOMYST) 20 % nebulizer solution Take 2 mLs by nebulization 2 (two) times daily.    Marland Kitchen albuterol (PROVENTIL HFA;VENTOLIN HFA) 108 (90 BASE) MCG/ACT inhaler Inhale 1 puff into the lungs every 6 (six) hours as needed for wheezing or shortness of breath.    Marland Kitchen albuterol (PROVENTIL) (2.5 MG/3ML) 0.083% nebulizer solution Inhale into the lungs.    Marland Kitchen albuterol-ipratropium (COMBIVENT) 18-103 MCG/ACT inhaler Inhale 2 puffs into the lungs every 6 (six) hours as needed.    . cetirizine (ZYRTEC) 10 MG tablet Take 10 mg by mouth daily.    . chlorpheniramine-HYDROcodone (TUSSIONEX PENNKINETIC ER) 10-8 MG/5ML SUER Take 5 mLs by mouth 2 (two) times daily. 140 mL 0  . Cholecalciferol (VITAMIN D3) 2000 UNITS capsule Take 2,000 Units by mouth daily.    Marland Kitchen esomeprazole  (NEXIUM) 40 MG capsule Take 40 mg by mouth daily before breakfast. Take 1 capsule twice a day    . Fluticasone-Salmeterol (ADVAIR) 250-50 MCG/DOSE AEPB Inhale 1 puff into the lungs every 12 (twelve) hours.    . furosemide (LASIX) 20 MG tablet Take 20 mg by mouth every other day.    Marland Kitchen glucose blood (ONETOUCH VERIO) test strip Test sugars 3 times daily 100 each 12  . hydrOXYzine (ATARAX/VISTARIL) 25 MG tablet Take 25 mg by mouth 3 (three) times daily as needed.    . metFORMIN (GLUCOPHAGE) 500 MG tablet Take 1 tablet (500 mg total) by mouth 2 (two) times daily with a meal. Two times a day with meal. (Patient taking differently: Take 500 mg by mouth daily with breakfast. Two times a day with meal.) 180 tablet 3  . montelukast (SINGULAIR) 10 MG tablet Take 10 mg by mouth at bedtime.    . Multiple Vitamin (MULTIVITAMIN) tablet Take 1 tablet by mouth daily.    Letta Pate DELICA LANCETS 33G MISC Test sugars three times daily 100 each 11  . predniSONE (DELTASONE) 10 MG tablet Take 1 tablet (10 mg total) by mouth daily. Take 60 mg on day 1, then decrease by 10 mg daily until gone 21 tablet 0  .  predniSONE (DELTASONE) 20 MG tablet Take 20 mg by mouth 2 (two) times daily with a meal.    . rivaroxaban (XARELTO) 10 MG TABS tablet Take 15 mg by mouth daily.     . sotalol (BETAPACE) 80 MG tablet Take 160 mg by mouth 2 (two) times daily.     . theophylline (THEODUR) 200 MG 12 hr tablet Take 200 mg by mouth 2 (two) times daily.     No current facility-administered medications on file prior to visit.    Review of Systems  Constitutional: Negative for appetite change and unexpected weight change.  HENT: Positive for congestion. Negative for sinus pressure.   Respiratory: Positive for cough (productive. ), shortness of breath (with exertion. ) and wheezing. Negative for chest tightness.   Cardiovascular: Negative for chest pain, palpitations and leg swelling (swelling has improved. ).  Gastrointestinal: Negative  for nausea, vomiting, abdominal pain and diarrhea.  Musculoskeletal: Negative for back pain and joint swelling.  Skin: Negative for color change and rash.  Neurological: Negative for dizziness, light-headedness and headaches.  Psychiatric/Behavioral: Negative for dysphoric mood and agitation.       Objective:    Physical Exam  HENT:  Nose: Nose normal.  Mouth/Throat: Oropharynx is clear and moist.  Neck: Neck supple. No thyromegaly present.  Cardiovascular: Normal rate and regular rhythm.   Pulmonary/Chest: No respiratory distress.  Some increased congestion.  Clears some with coughing.  Increased air movement.  No increased wheezing.   Abdominal: Soft. Bowel sounds are normal. There is no tenderness.  Musculoskeletal: She exhibits no edema or tenderness.  Lymphadenopathy:    She has no cervical adenopathy.  Skin: No rash noted. No erythema.  Psychiatric: She has a normal mood and affect. Her behavior is normal.    BP 130/70 mmHg  Pulse 87  Temp(Src) 98.2 F (36.8 C) (Oral)  Ht 5\' 2"  (1.575 m)  Wt 103 lb 8 oz (46.947 kg)  BMI 18.93 kg/m2  SpO2 94%  LMP 07/01/1969 Wt Readings from Last 3 Encounters:  01/10/15 103 lb 8 oz (46.947 kg)  01/06/15 104 lb (47.174 kg)  12/28/14 103 lb 4.8 oz (46.857 kg)     Lab Results  Component Value Date   WBC 16.3* 01/06/2015   HGB 11.9* 01/06/2015   HCT 37.6 01/06/2015   PLT 259 01/06/2015   GLUCOSE 174* 01/06/2015   CHOL 249* 07/01/2014   TRIG 182.0* 07/01/2014   HDL 43.80 07/01/2014   LDLDIRECT 139.1 12/23/2012   LDLCALC 169* 07/01/2014   ALT 11 07/01/2014   AST 9 07/01/2014   NA 136 01/06/2015   K 4.4 01/06/2015   CL 98* 01/06/2015   CREATININE 0.71 01/06/2015   BUN 16 01/06/2015   CO2 26 01/06/2015   TSH 1.586 07/04/2014   INR 1.0 09/17/2013   HGBA1C 7.8* 11/22/2014   MICROALBUR 0.3 12/23/2012    Dg Chest 1 View  01/06/2015   CLINICAL DATA:  Cough and shortness of breath for 1 week  EXAM: CHEST  1 VIEW   COMPARISON:  04/07/2014  FINDINGS: Cardiac shadow is within normal limits. The lungs are well aerated bilaterally. Mild scarring is noted in the bases bilaterally stable from the prior exam. Changes consistent with prior left proximal humeral fracture are again seen.  IMPRESSION: Chronic changes without acute abnormality.   Electronically Signed   By: Alcide CleverMark  Lukens M.D.   On: 01/06/2015 13:02       Assessment & Plan:   Problem List Items Addressed This  Visit    Atrial fibrillation - Primary    Followed by Dr Park Breed.  Stable.       COPD (chronic obstructive pulmonary disease)    Followed by Dr Meredeth Ide.  Recently evaluated in ER with COPD exacerbation.  Treated with abx and prednisone taper.  Remain on  of prednisone daily.  Declines hospitalization.  Feels breathing is better.  Follow.        Diabetes mellitus    Off insulin.  Sugars per her report under reasonable control.        Edema    Leg swelling is better.  Follow.       Hypoxia    Continue O2.        Tobacco abuse    Continues to smoke.  Desires not to quit.         I spent 25 minutes with the patient and more than 50% of the time was spent in consultation regarding the above.     Dale Byron, MD

## 2015-01-11 ENCOUNTER — Encounter: Payer: Self-pay | Admitting: Internal Medicine

## 2015-01-11 NOTE — Assessment & Plan Note (Signed)
Followed by Dr Meredeth IdeFleming.  Recently evaluated in ER with COPD exacerbation.  Treated with abx and prednisone taper.  Remain on 20mg  of prednisone daily.  Declines hospitalization.  Feels breathing is better.  Follow.

## 2015-01-11 NOTE — Assessment & Plan Note (Signed)
Continues to smoke.  Desires not to quit.

## 2015-01-11 NOTE — Assessment & Plan Note (Signed)
Leg swelling is better.  Follow.  

## 2015-01-11 NOTE — Assessment & Plan Note (Signed)
Continue O2 

## 2015-01-11 NOTE — Assessment & Plan Note (Signed)
Followed by Dr Kahn.  Stable.   

## 2015-01-11 NOTE — Assessment & Plan Note (Signed)
Off insulin.  Sugars per her report under reasonable control.

## 2015-01-20 ENCOUNTER — Other Ambulatory Visit: Payer: Self-pay | Admitting: Pharmacist

## 2015-01-20 NOTE — Patient Outreach (Signed)
Called Carla Harris in response to a voicemail that I received from her from yesterday. Carla Harris reports that she has received a new type of [facility] Reports that it is not like her previous vial and comes with a small dropper and that she does not think that it is the same medication. Patient is unable to read the print on the bottle, so I have come to her home to verify that this is an equivalent solution.   Confirmed that the new vial is an equivalent 20% solution, like her previous vial, and is intended for inhalation. As this vial has a rubber stopper, but comes with a dropper, I contact the product's manufacturer, [employer] at [phone] for further details. Per Carla [facility] with [facility], the entire top can be removed and the solution drawn up with the dropper. However, the patient is fine to continue using the needle and syringe to obtain her dose. Counseled Carla Harris that the vials are to be stored in the fridge, each is only good for four days once opened and that she should be using this twice daily with her albuterol. Carla Harris verbalizes understanding.  Carla Harris reports that her breathing continues to be tight with activity. Reinforced previous counseling about the importance of using the Combivent four times daily on a scheduled basis. Also reviewed the every other day dosing of the patient's furosemide. Carla Harris confirms that she has marked this on her calendar to help her to remember.   Carla Harris reports that she has no further medication questions at this time. Reminded her to call me if she has further questions in the future.   At the time, will send a message to CMA Carla Harris that I am closing this pharmacy episode for the patient.  Carla Harris, PharmD Clinical Pharmacist [facility] [phone]

## 2015-01-31 ENCOUNTER — Other Ambulatory Visit: Payer: Self-pay | Admitting: *Deleted

## 2015-01-31 NOTE — Patient Outreach (Signed)
[facility] Care Management   01/31/2015  [name] 12/06/1928 [id number]  Carla Harris is an 81 y.o. female  Subjective: "I just have not been able to get my energy back since my last sickness. I have no energy." "I don't know why but I am losing weight."  Objective: Blood pressure 120/78, pulse 68, resp. rate 20, height 1.524 m (5'), weight 98 lb (44.453 kg), last menstrual period 07/01/1969, SpO2 98 %.  Review of Systems  Constitutional: Positive for weight loss and malaise/fatigue.  HENT: Positive for congestion.   Respiratory: Positive for cough, sputum production, shortness of breath and wheezing.   Musculoskeletal: Negative for falls.  Psychiatric/Behavioral: Positive for depression.    Physical Exam  Constitutional: She is oriented to person, place, and time. She appears lethargic.  Cardiovascular: Normal rate and regular rhythm.   Pulses:      Dorsalis pedis pulses are 2+ on the right side, and 2+ on the left side.  Respiratory: She has decreased breath sounds in the right lower field and the left lower field. She has wheezes in the right middle field and the left middle field. She has rhonchi in the right middle field and the left middle field.  GI: Soft. Normal appearance and bowel sounds are normal.  Musculoskeletal:       Right ankle: She exhibits swelling.       Left ankle: She exhibits swelling.  Neurological: She is oriented to person, place, and time. She appears lethargic.  Skin: Skin is warm, dry and intact.     Psychiatric: Her speech is normal and behavior is normal. Judgment and thought content normal. Cognition and memory are normal. She exhibits a depressed mood.    Current Medications:   Medications reviewed please see encounter for currently taking meds. Current Outpatient Prescriptions  Medication Sig Dispense Refill  . acetylcysteine (MUCOMYST) 20 % nebulizer solution Take 2 mLs by nebulization 2 (two) times daily.    Marland Kitchen.  albuterol (PROVENTIL) (2.5 MG/3ML) 0.083% nebulizer solution Inhale into the lungs.    Marland Kitchen. albuterol-ipratropium (COMBIVENT) 18-103 MCG/ACT inhaler Inhale 2 puffs into the lungs every 6 (six) hours as needed.    . cetirizine (ZYRTEC) 10 MG tablet Take 10 mg by mouth daily.    . Cholecalciferol (VITAMIN D3) 2000 UNITS capsule Take 2,000 Units by mouth daily.    Marland Kitchen. esomeprazole (NEXIUM) 40 MG capsule Take 40 mg by mouth daily before breakfast. Take 1 capsule twice a day    . Fluticasone-Salmeterol (ADVAIR) 250-50 MCG/DOSE AEPB Inhale 1 puff into the lungs every 12 (twelve) hours.    . furosemide (LASIX) 20 MG tablet Take 20 mg by mouth every other day.    Marland Kitchen. glucose blood (ONETOUCH VERIO) test strip Test sugars 3 times daily 100 each 12  . hydrOXYzine (ATARAX/VISTARIL) 25 MG tablet Take 25 mg by mouth 3 (three) times daily as needed.    . metFORMIN (GLUCOPHAGE) 500 MG tablet Take 1 tablet (500 mg total) by mouth 2 (two) times daily with a meal. Two times a day with meal. (Patient taking differently: Take 500 mg by mouth daily with breakfast. Two times a day with meal.) 180 tablet 3  . montelukast (SINGULAIR) 10 MG tablet Take 10 mg by mouth at bedtime.    . Multiple Vitamin (MULTIVITAMIN) tablet Take 1 tablet by mouth daily.    Letta Pate. ONETOUCH DELICA LANCETS 33G MISC Test sugars three times daily 100 each 11  . predniSONE (DELTASONE) 20 MG  tablet Take 10 mg by mouth 2 (two) times daily with a meal.     . rivaroxaban (XARELTO) 10 MG TABS tablet Take 15 mg by mouth daily.     . sotalol (BETAPACE) 80 MG tablet Take 160 mg by mouth 2 (two) times daily.     . theophylline (THEODUR) 200 MG 12 hr tablet Take 200 mg by mouth 2 (two) times daily.    Marland Kitchen albuterol (PROVENTIL HFA;VENTOLIN HFA) 108 (90 BASE) MCG/ACT inhaler Inhale 1 puff into the lungs every 6 (six) hours as needed for wheezing or shortness of breath.    . chlorpheniramine-HYDROcodone (TUSSIONEX PENNKINETIC ER) 10-8 MG/5ML SUER Take 5 mLs by mouth 2 (two)  times daily. (Patient not taking: Reported on 01/31/2015) 140 mL 0  . predniSONE (DELTASONE) 10 MG tablet Take 1 tablet (10 mg total) by mouth daily. Take 60 mg on day 1, then decrease by 10 mg daily until gone (Patient not taking: Reported on 01/31/2015) 21 tablet 0   No current facility-administered medications for this visit.     Assessment: See physical assessment as above.  Pt noted to be leaning forward during conversation to assist with respirations. Using O2 at 2lpm via n/c. RNCM assisted pt with changing her 02 tubing on her home concentrator and travel O2. Pt reported new medication pulmonologist placed her on caused her to feel "tighter" and she was going to talk with him about it.   Pt with a 6lb weight loss since RNCM last home visit. RNCM and pt discussed possible reasons for weight loss, pt noting she had been sort of "down in the dumps" lately related to some family stress and not feeling well. Pt noted to look frail and thin. Ways to add extra calories to diet discussed.  Advanced directives form discussed and RNCM assisted pt with filling the form out with pt's wishes. Pt verbalized understanding of the need to have paperwork witnessed and notarized. RNCM and pt had a conversation about what patient's wishes are for the end of life. Pt unsure of her desires concerning hospice.    Plan:  RNCM will provide pt with education on adding calories to your diet as a diabetic. RNCM will route this communication to pts primary care MD.  [facility] will see pt in one month at her home.  Costella Hatcher RN, BSN  [facility] Care Management [phone])

## 2015-02-14 ENCOUNTER — Other Ambulatory Visit: Payer: Self-pay | Admitting: *Deleted

## 2015-02-14 NOTE — Patient Outreach (Signed)
RNCM returned pt call. Pt had called to make RNCM aware she had seen pulmonologist yesterday. Pt stated she had been feeling bad and was wheezing when she got to his office. Pt stated she received 2 breathing treatments while there and a steroid injection. She also reported MD gave her a prescription for a cough syrup. Pt stated she was feeling some better this afternoon as compared to yesterday. Pt reported she made MD aware she was no longer taking the special order nebulizer related to it causing her to feel "tight".  Pt stated she drove herself to Md and to pharmacy to pick up medication. RNCM reinforced pt should not drive while taking narcotic cough medication, pt verbalized understanding.   Plan: RNCM will keep scheduled f/u appt. RNCM will review pulmonologist's notes.  Costella Hatcher RN, BSN  [facility] Care Management [phone])

## 2015-03-03 ENCOUNTER — Ambulatory Visit: Payer: Self-pay | Admitting: *Deleted

## 2015-03-16 ENCOUNTER — Other Ambulatory Visit: Payer: Self-pay | Admitting: *Deleted

## 2015-03-16 ENCOUNTER — Encounter: Payer: Self-pay | Admitting: *Deleted

## 2015-03-16 NOTE — Patient Outreach (Signed)
[facility]) Care Management   03/16/2015  Carla Harris Dec 18, 1928 [id number]  Carla Harris is an 81 y.o. female  Subjective: "I am having a really hard time getting out of the car. It's like my hips just give out on me and I just can't get up." "I can tell my arms are getting weaker too." "I haven't wanted to do any of the things I enjoy lately like crochet or even read the funnies in the paper." "I am hoping I can get outside and walk in my yard when the weather cools off."   Objective: Blood pressure 116/68, pulse 64, resp. rate 16, height 1.575 m ( ), weight 100 lb (45.36 kg), last menstrual period 07/01/1969, SpO2 95 %.  Review of Systems  Constitutional: Positive for weight loss and malaise/fatigue.  HENT: Positive for congestion.   Respiratory: Positive for cough, sputum production, shortness of breath and wheezing.   Musculoskeletal: Negative for falls.  Neurological: Positive for weakness.  Endo/Heme/Allergies: Bruises/bleeds easily.  Psychiatric/Behavioral: Positive for depression.    Physical Exam  Constitutional: She is oriented to person, place, and time. Vital signs are normal.  HENT:  Head:    Cardiovascular: Normal rate and regular rhythm.   Pulses:      Dorsalis pedis pulses are 2+ on the right side, and 2+ on the left side.  Respiratory: She has decreased breath sounds in the right lower field and the left lower field. She has wheezes in the right middle field. She has rhonchi in the right upper field, the right middle field, the left upper field and the left middle field.  GI: Soft. Bowel sounds are normal.  Neurological: She is alert and oriented to person, place, and time.  Skin: Skin is warm and dry.     Bilateral feet noted to be extremely dry. Small healing scratch to top of R foot. Other than that bilateral feet with skin intact, checked per diabetic protocol.  Psychiatric: Her speech is normal and behavior is normal. Judgment and  thought content normal. Cognition and memory are normal. She exhibits a depressed mood.    Current Medications:  Medications reviewed please see encounter for currently taking meds.  Current Outpatient Prescriptions  Medication Sig Dispense Refill  . acetylcysteine (MUCOMYST) 20 % nebulizer solution Take 2 mLs by nebulization 2 (two) times daily.    Marland Kitchen albuterol (PROVENTIL) (2.5 MG/3ML) 0.083% nebulizer solution Inhale into the lungs.    Marland Kitchen albuterol-ipratropium (COMBIVENT) 18-103 MCG/ACT inhaler Inhale 2 puffs into the lungs every 6 (six) hours as needed.    . cetirizine (ZYRTEC) 10 MG tablet Take 10 mg by mouth daily.    . Cholecalciferol (VITAMIN D3) 2000 UNITS capsule Take 2,000 Units by mouth daily.    Marland Kitchen esomeprazole (NEXIUM) 40 MG capsule Take 40 mg by mouth daily before breakfast. Take 1 capsule twice a day    . Fluticasone-Salmeterol (ADVAIR) 250-50 MCG/DOSE AEPB Inhale 1 puff into the lungs every 12 (twelve) hours.    . furosemide (LASIX) 20 MG tablet Take 20 mg by mouth every other day.    Marland Kitchen glucose blood (ONETOUCH VERIO) test strip Test sugars 3 times daily 100 each 12  . metFORMIN (GLUCOPHAGE) 500 MG tablet Take 1 tablet (500 mg total) by mouth 2 (two) times daily with a meal. Two times a day with meal. (Patient taking differently: Take 500 mg by mouth daily with breakfast. Two times a day with meal.) 180 tablet 3  . montelukast (SINGULAIR)  10 MG tablet Take 10 mg by mouth at bedtime.    Letta Pate DELICA LANCETS 33G MISC Test sugars three times daily 100 each 11  . predniSONE (DELTASONE) 20 MG tablet Take 10 mg by mouth 2 (two) times daily with a meal.     . rivaroxaban (XARELTO) 10 MG TABS tablet Take 15 mg by mouth daily.     . sotalol (BETAPACE) 80 MG tablet Take 160 mg by mouth 2 (two) times daily.     . theophylline (THEODUR) 200 MG 12 hr tablet Take 200 mg by mouth 2 (two) times daily.    Marland Kitchen albuterol (PROVENTIL HFA;VENTOLIN HFA) 108 (90 BASE) MCG/ACT inhaler Inhale 1 puff into  the lungs every 6 (six) hours as needed for wheezing or shortness of breath.    . chlorpheniramine-HYDROcodone (TUSSIONEX PENNKINETIC ER) 10-8 MG/5ML SUER Take 5 mLs by mouth 2 (two) times daily. (Patient not taking: Reported on 01/31/2015) 140 mL 0  . hydrOXYzine (ATARAX/VISTARIL) 25 MG tablet Take 25 mg by mouth 3 (three) times daily as needed.    . Multiple Vitamin (MULTIVITAMIN) tablet Take 1 tablet by mouth daily.    . predniSONE (DELTASONE) 10 MG tablet Take 1 tablet (10 mg total) by mouth daily. Take 60 mg on day 1, then decrease by 10 mg daily until gone (Patient not taking: Reported on 01/31/2015) 21 tablet 0   No current facility-administered medications for this visit.    Functional Status:   In your present state of health, do you have any difficulty performing the following activities: 03/16/2015 10/14/2014  Hearing? N N  Vision? N N  Difficulty concentrating or making decisions? N N  Walking or climbing stairs? Y Y  Dressing or bathing? N N  Doing errands, shopping? Carla Harris  Preparing Food and eating ? N N  Using the Toilet? N N  In the past six months, have you accidently leaked urine? N N  Do you have problems with loss of bowel control? N N  Managing your Medications? N Y  Managing your Finances? N N  Housekeeping or managing your Housekeeping? Carla Harris    Fall/Depression Screening:    PHQ 2/9 Scores 03/16/2015 10/14/2014 10/14/2014 07/27/2013 07/10/2012  PHQ - 2 Score 5 4 4 2  0  PHQ- 9 Score 16 12 - 5 -   Fall Risk  03/16/2015 10/14/2014 07/27/2013 07/10/2012  Falls in the past year? Yes Yes Yes No  Number falls in past yr: 1 1 - -  Injury with Fall? No No - -  Risk Factor Category  High Fall Risk High Fall Risk - -  Risk for fall due to : History of fall(s);Impaired balance/gait;Medication side effect Impaired balance/gait;Medication side effect - -  Follow up Education provided;Falls prevention discussed Falls prevention discussed - -     Assessment: See above physical  assessment. General: Pleasant alert and oriented x3 elderly pt. Son Rosalia Hammers present at beginning of visit. Pt states son is helpful with IADLS. Noted to be in declining health for some time now related to worsening COPD, HF and DM. RNCM discussed with pt the possibility of hospice or palliative care and pt declined stating she felt she was getting along ok right now. Also discussed with pt her symptoms of depression as evidenced by her PHQ-9 score. Pt admits feeling depressed most of the days and not feeling like doing the things that used to give her pleasure such as crocheting and reading. RNCM discussed with pt about the possibility  of her MD adding an anti-depression medication to aide with symptoms. Pt states she does not want to take anymore pills. RNCM discussed with pt natural ways to boost mood such as exercise. RNCM encouraged pt to talk with her PCP about this issue.   COPD: Lungs with wheezes and rhonchi scattered throughout and decreased bases. Pt reports coughing up thick sputum which at times does have some color to it. Pt noted to get SOB with minimal activity and noted to be wearing O2 at 2lpm via n/c more this visit. Pt reports she has sent another sputum culture to the lab as requested by the infectious disease doctor she recently saw. Pt was unable to take the antibiotic ID MD prescribed related to an allergy. RNCM educated pt on the dangers of increasing her O2 after pt asked about increasing O2. Pt stating she has been unable to use flutter valve because it takes too much of her breath. Pt continues to smoke with no plans to quit.   HF: Pt is SOB with minimal activity. Lower extremities without edema. Pt without weight gain. Pt stating cardiologist ordered her lasix every other day but she wanted to talk with her PCP about this. RNCM assisted pt in making a list of questions for PCP.   DM: Pt had only recorded blood sugars randomly for the last month with an average of 153. Pt's blood sugar  168 today, 1 hour post meal.  Encouraged pt to make an eye doctor appt. +2 pulses to bilat feet, small scratch to top of right foot, healing.   Weight loss: Pt has added calories to her diet but continues to lose weight. Pt refused RNCM's offer to provide dietary supplement such as boost or ensure.   Plan: RNCM will attend pt's pulmonology appointment and assist her in filing her completed advanced directives at [facility] through pastoral care. RNCM will see pt for home visit in one month.  Costella Hatcher RN, BSN  [facility] Care Management [phone])

## 2015-03-17 ENCOUNTER — Encounter: Payer: Self-pay | Admitting: Internal Medicine

## 2015-03-17 ENCOUNTER — Ambulatory Visit (INDEPENDENT_AMBULATORY_CARE_PROVIDER_SITE_OTHER): Payer: Medicare Other | Admitting: Internal Medicine

## 2015-03-17 VITALS — BP 116/67 | HR 62 | Temp 97.6°F | Ht 62.0 in | Wt 99.5 lb

## 2015-03-17 DIAGNOSIS — R609 Edema, unspecified: Secondary | ICD-10-CM

## 2015-03-17 DIAGNOSIS — E7800 Pure hypercholesterolemia, unspecified: Secondary | ICD-10-CM

## 2015-03-17 DIAGNOSIS — R634 Abnormal weight loss: Secondary | ICD-10-CM

## 2015-03-17 DIAGNOSIS — I2510 Atherosclerotic heart disease of native coronary artery without angina pectoris: Secondary | ICD-10-CM

## 2015-03-17 DIAGNOSIS — M810 Age-related osteoporosis without current pathological fracture: Secondary | ICD-10-CM

## 2015-03-17 DIAGNOSIS — J471 Bronchiectasis with (acute) exacerbation: Secondary | ICD-10-CM

## 2015-03-17 DIAGNOSIS — N9489 Other specified conditions associated with female genital organs and menstrual cycle: Secondary | ICD-10-CM

## 2015-03-17 DIAGNOSIS — E119 Type 2 diabetes mellitus without complications: Secondary | ICD-10-CM

## 2015-03-17 DIAGNOSIS — R0902 Hypoxemia: Secondary | ICD-10-CM

## 2015-03-17 DIAGNOSIS — J449 Chronic obstructive pulmonary disease, unspecified: Secondary | ICD-10-CM | POA: Diagnosis not present

## 2015-03-17 DIAGNOSIS — R5383 Other fatigue: Secondary | ICD-10-CM

## 2015-03-17 DIAGNOSIS — K219 Gastro-esophageal reflux disease without esophagitis: Secondary | ICD-10-CM

## 2015-03-17 DIAGNOSIS — D649 Anemia, unspecified: Secondary | ICD-10-CM | POA: Diagnosis not present

## 2015-03-17 DIAGNOSIS — N949 Unspecified condition associated with female genital organs and menstrual cycle: Secondary | ICD-10-CM

## 2015-03-17 DIAGNOSIS — Z720 Tobacco use: Secondary | ICD-10-CM

## 2015-03-17 DIAGNOSIS — L989 Disorder of the skin and subcutaneous tissue, unspecified: Secondary | ICD-10-CM

## 2015-03-17 DIAGNOSIS — I4891 Unspecified atrial fibrillation: Secondary | ICD-10-CM

## 2015-03-17 DIAGNOSIS — R42 Dizziness and giddiness: Secondary | ICD-10-CM

## 2015-03-17 LAB — CBC WITH DIFFERENTIAL/PLATELET
BASOS PCT: 0.2 % (ref 0.0–3.0)
Basophils Absolute: 0 10*3/uL (ref 0.0–0.1)
EOS PCT: 0.3 % (ref 0.0–5.0)
Eosinophils Absolute: 0 10*3/uL (ref 0.0–0.7)
HEMATOCRIT: 39.4 % (ref 36.0–46.0)
HEMOGLOBIN: 13 g/dL (ref 12.0–15.0)
LYMPHS PCT: 7.5 % — AB (ref 12.0–46.0)
Lymphs Abs: 1.1 10*3/uL (ref 0.7–4.0)
MCHC: 33 g/dL (ref 30.0–36.0)
MCV: 83.9 fl (ref 78.0–100.0)
MONOS PCT: 1.5 % — AB (ref 3.0–12.0)
Monocytes Absolute: 0.2 10*3/uL (ref 0.1–1.0)
NEUTROS ABS: 13.5 10*3/uL — AB (ref 1.4–7.7)
Neutrophils Relative %: 90.5 % — ABNORMAL HIGH (ref 43.0–77.0)
PLATELETS: 235 10*3/uL (ref 150.0–400.0)
RBC: 4.7 Mil/uL (ref 3.87–5.11)
RDW: 17.8 % — AB (ref 11.5–15.5)
WBC: 15 10*3/uL — AB (ref 4.0–10.5)

## 2015-03-17 LAB — HEPATIC FUNCTION PANEL
ALK PHOS: 42 U/L (ref 39–117)
ALT: 7 U/L (ref 0–35)
AST: 8 U/L (ref 0–37)
Albumin: 3.4 g/dL — ABNORMAL LOW (ref 3.5–5.2)
BILIRUBIN DIRECT: 0.1 mg/dL (ref 0.0–0.3)
TOTAL PROTEIN: 5.6 g/dL — AB (ref 6.0–8.3)
Total Bilirubin: 0.6 mg/dL (ref 0.2–1.2)

## 2015-03-17 LAB — BASIC METABOLIC PANEL
BUN: 11 mg/dL (ref 6–23)
CHLORIDE: 100 meq/L (ref 96–112)
CO2: 28 mEq/L (ref 19–32)
Calcium: 8.7 mg/dL (ref 8.4–10.5)
Creatinine, Ser: 0.55 mg/dL (ref 0.40–1.20)
GFR: 111.95 mL/min (ref >60.00)
GLUCOSE: 155 mg/dL — AB (ref 70–99)
POTASSIUM: 4.8 meq/L (ref 3.5–5.1)
SODIUM: 138 meq/L (ref 135–145)

## 2015-03-17 LAB — LIPID PANEL
CHOL/HDL RATIO: 5
Cholesterol: 225 mg/dL — ABNORMAL HIGH (ref 0–200)
HDL: 44.4 mg/dL (ref >39.00)
NONHDL: 180.65
Triglycerides: 206 mg/dL — ABNORMAL HIGH (ref 0.0–149.0)
VLDL: 41.2 mg/dL — AB (ref 0.0–40.0)

## 2015-03-17 LAB — TSH: TSH: 0.82 u[IU]/mL (ref 0.35–4.50)

## 2015-03-17 LAB — HEMOGLOBIN A1C: Hgb A1c MFr Bld: 7.6 % — ABNORMAL HIGH (ref 4.6–6.5)

## 2015-03-17 LAB — FERRITIN: FERRITIN: 20 ng/mL (ref 10.0–291.0)

## 2015-03-17 LAB — LDL CHOLESTEROL, DIRECT: Direct LDL: 150 mg/dL

## 2015-03-17 NOTE — Progress Notes (Signed)
Patient ID: Carla Harris, female   DOB: January 17, 1929, 81 y.o.   MRN: [id number]   Subjective:    Patient ID: Carla Harris, female    DOB: [birth year], 81 y.o.   MRN: [id number]  HPI  Patient here for a scheduled follow up.   Reports some increased fatigue.  We discussed this at length.  Discussed her severe COPD and heart issues.  Feel this is multifactorial.  Has lost weight.  Discussed further w/up.  She declines.  Desires no scanning.  Did agree to labs.  Stats is eating.  No nausea or vomiting.  No abdominal pain or cramping.  Bowels stable.  Persistent scalp lesion.  Discussed dermatology referral.  She is taking lasix qod.  Cardiology started her on this dose of lasix.  Swelling is better.  She is due to see cardiology next week.  Plans to discuss with them.  Still gets light headed when she stands. Last for a brief period and resolves when she stands for a while.  No persistent dizziness.     Past Medical History  Diagnosis Date  . Anemia   . Diabetes mellitus without complication   . Bronchiectasis   . Colon polyps   . Hypercholesteremia   . S/P AAA repair   . Right rotator cuff tear   . COPD (chronic obstructive pulmonary disease)   . Low serum IgG1 and IgM levels   . Oxygen deficiency   . Cataract     resolved with surgery   Past Surgical History  Procedure Laterality Date  . Appendectomy    . Abdominal hysterectomy    . Abdominal aortic aneurysm repair    . Rotator cuff repair     Family History  Problem Relation Age of Onset  . COPD Mother   . Liver cancer Sister   . Colon cancer Maternal Grandmother   . Diabetes Daughter    Social History   Social History  . Marital Status: Widowed    Spouse Name: N/A  . Number of Children: N/A  . Years of Education: N/A   Social History Main Topics  . Smoking status: Current Every Day Smoker    Types: Cigarettes  . Smokeless tobacco: Never Used     Comment: Has decreased to about 5 a day  . Alcohol Use: No  . Drug  Use: No  . Sexual Activity: Not Asked   Other Topics Concern  . None   Social History Narrative     Review of Systems  Constitutional: Positive for fatigue.       Weight loss.    HENT: Negative for congestion and sinus pressure.   Eyes: Negative for pain and discharge.  Respiratory: Positive for cough and shortness of breath. Negative for chest tightness.   Cardiovascular: Negative for chest pain, palpitations and leg swelling.  Gastrointestinal: Negative for nausea, vomiting, abdominal pain and diarrhea.  Genitourinary: Negative for dysuria and difficulty urinating.  Musculoskeletal:       Some muscle weakness.  Fatigue.   Skin: Negative for color change and rash.  Neurological: Positive for light-headedness. Negative for headaches.  Hematological: Negative for adenopathy.  Psychiatric/Behavioral: Negative for dysphoric mood and agitation.       Objective:     Blood pressure rechecked by me:  118/60 standing and 122/64 lying.    Physical Exam  Constitutional: No distress.  HENT:  Nose: Nose normal.  Mouth/Throat: Oropharynx is clear and moist.  Eyes: Conjunctivae are normal. Right eye exhibits no  discharge. Left eye exhibits no discharge.  Neck: Neck supple. No thyromegaly present.  Cardiovascular: Normal rate and regular rhythm.   Pulmonary/Chest: Breath sounds normal. No respiratory distress. She has no wheezes.  Abdominal: Soft. Bowel sounds are normal. There is no tenderness.  Musculoskeletal: She exhibits no edema or tenderness.  Lymphadenopathy:    She has no cervical adenopathy.  Skin: No rash noted. She is not diaphoretic. No erythema.  Psychiatric: She has a normal mood and affect. Her behavior is normal.    BP 116/67 mmHg  Pulse 62  Temp(Src) 97.6 F (36.4 C) (Oral)  Ht _0  (1.575 m)  Wt 99 lb 8 oz (45.133 kg)  BMI 18.19 kg/m2  SpO2 95%  LMP 07/01/1969 Wt Readings from Last 3 Encounters:  03/17/15 99 lb 8 oz (45.133 kg)  03/16/15 100 lb (45.36  kg)  01/31/15 98 lb (44.453 kg)     Lab Results  Component Value Date   WBC 15.0* 03/17/2015   HGB 13.0 03/17/2015   HCT 39.4 03/17/2015   PLT 235.0 03/17/2015   GLUCOSE 155* 03/17/2015   CHOL 225* 03/17/2015   TRIG 206.0* 03/17/2015   HDL 44.40 03/17/2015   LDLDIRECT 150.0 03/17/2015   LDLCALC 169* 07/01/2014   ALT 7 03/17/2015   AST 8 03/17/2015   NA 138 03/17/2015   K 4.8 03/17/2015   CL 100 03/17/2015   CREATININE 0.55 03/17/2015   BUN 11 03/17/2015   CO2 28 03/17/2015   TSH 0.82 03/17/2015   INR 1.0 09/17/2013   HGBA1C 7.6* 03/17/2015   MICROALBUR 0.3 12/23/2012    Dg Chest 1 View  01/06/2015   CLINICAL DATA:  Cough and shortness of breath for 1 week  EXAM: CHEST  1 VIEW  COMPARISON:  04/07/2014  FINDINGS: Cardiac shadow is within normal limits. The lungs are well aerated bilaterally. Mild scarring is noted in the bases bilaterally stable from the prior exam. Changes consistent with prior left proximal humeral fracture are again seen.  IMPRESSION: Chronic changes without acute abnormality.   Electronically Signed   By: Inez Catalina M.D.   On: 01/06/2015 13:02       Assessment & Plan:   Problem List Items Addressed This Visit    Adnexal mass    Had an adnexal mass noted on recent CT.  She has declined further w/up.        Anemia    Follow cbc.        Relevant Orders   CBC with Differential/Platelet (Completed)   Ferritin (Completed)   Atrial fibrillation    Seeing cardiology.  Rated controlled.  No increased heart rate or palpitations.        Relevant Orders   TSH (Completed)   Bronchiectasis    Continue home O2.  Followed by Dr Raul Del.  Just saw Dr Ola Spurr.  See his note for details.  Has chronic congestion and cough.        CAD (coronary artery disease)    CT angiography - calcium score is 1980.  Right dominant system.  Mild to moderate diffuse disease with diffuse calcification.  No obstructive disease.  Decided on medical management.         Relevant Orders   Lipid panel (Completed)   Hepatic function panel (Completed)   Basic metabolic panel (Completed)   COPD (chronic obstructive pulmonary disease) - Primary    Followed by Dr Raul Del.  On chronic prednisone.  Saw Infectious Disease.  See his note for details.  On O2.  We discussed her lung issues at length today.        Diabetes mellitus    Off insulin. She has cut back on her sweets.  Checks sugar once a week.  States remaining under 200.  Check metabolic panel and U8H.        Relevant Orders   Hemoglobin A1c (Completed)   Dizziness    We discussed this at length today.  This is not a new issue for her.  States occurs when she stands.  Very brief.  After up for a brief period - resolves.  Not orthostatic on exam.  Question of related to some vertebrobasilar insufficiency.  Discussed slow position changes and movements.  Discussed further w/up.  She declines.  Follow.        Edema    Swelling is better.  On lasix.  Has f/u with cardiology next week.  They put her on lasix qod.  She was instructed to discuss continued lasix with cardiology.  Check metabolic panel today.        Fatigue    Feel this is multifactorial.  She has severe COPD.  Also has underlying heart issues.  This is going to contribute to her fatigue and weakness.  Will check cbc, met c and tsh to confirm no metabolic etiology.        GERD (gastroesophageal reflux disease)    Controlled.       Hypercholesterolemia    We have discussed statin medication previously.  She had expressed desire not to take.  Follow lipid panel.        Hypoxia    On oxygen.       Osteoporosis    Declines bone density.  Continue calcium and vitamin D.       Scalp lesion    Persistent lesion.  Refer to dermatology.        Relevant Orders   Ambulatory referral to Dermatology   Tobacco abuse    Continues to smoke.  Discussed the need to quit.  She desires to continue to smoke.        Weight loss    Persistent  weight loss.  Discussed with her today.  She declines any further w/up including scanning, etc.  Encourage increased po intake.  Check routine labs.            Einar Pheasant, MD

## 2015-03-19 ENCOUNTER — Encounter: Payer: Self-pay | Admitting: Internal Medicine

## 2015-03-19 DIAGNOSIS — R634 Abnormal weight loss: Secondary | ICD-10-CM | POA: Insufficient documentation

## 2015-03-19 DIAGNOSIS — L989 Disorder of the skin and subcutaneous tissue, unspecified: Secondary | ICD-10-CM | POA: Insufficient documentation

## 2015-03-19 NOTE — Assessment & Plan Note (Signed)
Controlled.  

## 2015-03-19 NOTE — Assessment & Plan Note (Signed)
We have discussed statin medication previously.  She had expressed desire not to take.  Follow lipid panel.

## 2015-03-19 NOTE — Assessment & Plan Note (Signed)
Had an adnexal mass noted on recent CT.  She has declined further w/up.

## 2015-03-19 NOTE — Assessment & Plan Note (Signed)
Persistent lesion.  Refer to dermatology.  

## 2015-03-19 NOTE — Assessment & Plan Note (Signed)
Declines bone density.  Continue calcium and vitamin D.

## 2015-03-19 NOTE — Assessment & Plan Note (Signed)
Swelling is better.  On lasix.  Has f/u with cardiology next week.  They put her on lasix qod.  She was instructed to discuss continued lasix with cardiology.  Check metabolic panel today.

## 2015-03-19 NOTE — Assessment & Plan Note (Signed)
Follow cbc.  

## 2015-03-19 NOTE — Assessment & Plan Note (Signed)
Feel this is multifactorial.  She has severe COPD.  Also has underlying heart issues.  This is going to contribute to her fatigue and weakness.  Will check cbc, met c and tsh to confirm no metabolic etiology.

## 2015-03-19 NOTE — Assessment & Plan Note (Signed)
On oxygen 

## 2015-03-19 NOTE — Assessment & Plan Note (Signed)
Off insulin. She has cut back on her sweets.  Checks sugar once a week.  States remaining under 200.  Check metabolic panel and a1c.

## 2015-03-19 NOTE — Assessment & Plan Note (Signed)
Continues to smoke.  Discussed the need to quit.  She desires to continue to smoke.

## 2015-03-19 NOTE — Assessment & Plan Note (Addendum)
Continue home O2.  Followed by Dr Meredeth Ide.  Just saw Dr Sampson Goon.  See his note for details.  Has chronic congestion and cough.

## 2015-03-19 NOTE — Assessment & Plan Note (Signed)
CT angiography - calcium score is 1980.  Right dominant system.  Mild to moderate diffuse disease with diffuse calcification.  No obstructive disease.  Decided on medical management.

## 2015-03-19 NOTE — Assessment & Plan Note (Signed)
Persistent weight loss.  Discussed with her today.  She declines any further w/up including scanning, etc.  Encourage increased po intake.  Check routine labs.

## 2015-03-19 NOTE — Assessment & Plan Note (Signed)
We discussed this at length today.  This is not a new issue for her.  States occurs when she stands.  Very brief.  After up for a brief period - resolves.  Not orthostatic on exam.  Question of related to some vertebrobasilar insufficiency.  Discussed slow position changes and movements.  Discussed further w/up.  She declines.  Follow.

## 2015-03-19 NOTE — Assessment & Plan Note (Signed)
Followed by Dr Meredeth Ide.  On chronic prednisone.  Saw Infectious Disease.  See his note for details.  On O2.  We discussed her lung issues at length today.

## 2015-03-19 NOTE — Assessment & Plan Note (Signed)
Seeing cardiology.  Rated controlled.  No increased heart rate or palpitations.

## 2015-03-21 ENCOUNTER — Encounter: Payer: Self-pay | Admitting: *Deleted

## 2015-03-29 ENCOUNTER — Other Ambulatory Visit: Payer: Self-pay | Admitting: *Deleted

## 2015-03-29 NOTE — Patient Outreach (Signed)
RNCM attended 2 MD appointments with pt at [facility] in [place].  First appointment was with Dr. Sampson Goon, infectious disease specialist. Dr. Sampson Goon discussed with pt the results of August 2016 sputum culture which was negative for psuedomonas and streptomyces found in sputum from May 2016  but possitive for Stenotrophomonas maltophilia. MD assessed pt's mouth and noted pt's thrush had resolved. MD initially wanted pt to be placed on Bactrim. Pt stated she didn't think she was allergic to this medication and this MD office did not have it listed as an allergy at that time. MD also ordered more Nystatin in case pt had reoccurring symptoms of thrush. MD did not feel the pt using a probiotic would be useful. MD discussed that starting an antibiotic would assist in giving pt better quality of life related to reduced secretions. Pt is to follow up in 3 weeks.  While pt was waiting for next appointment RNCM discussed with [facility]-Pharmacist a suspected pt allergy to sulfa medications. [facility]-Pharmacist confirmed pt has septra listed as an allergy in [facility] EMR with itching and swelling of face and hands as the reaction. Pharmacist also confirmed this allergy was not listed in [facility] EMR.   Second appointment was with Dr. Brett Canales, pulmonologist. [facility] made pulmonologist aware of the pt's allergy not being listed in MD's current EMR and the previous MD had inadvertently ordered a medication the pt had an allergy to. Meredeth Ide contacted [place] and made him aware of pt's allergy. Fitzgerald let Meredeth Ide know that he would do further investigation on an appropriate antibiotic for pt to use, and planned to possibly be in touch with pt's cardiologist to ask for a medication change to prevent an interaction with an antibiotic he could potentially use. Pulmonologist noted that pt's lungs were not as rhonchus as at previous appointment. RNCM talked with MD about what pt should use as her rescue inhaler if she was  away from home. MD felt pt should continue on current treatment not adding another inhaler and use combivent if a rescue inhaler was needed. Pt discussed with MD turning up her O2. MD told pt she could turn it up to 3lpm during activity but while resting it should be at 2lpm. MD offered pt the flu shot and pt adamantly refused. Pt also talked with MD about recent weight loss. MD explained to pt her current lung function could not support her ideal weight so her body naturally reduces weight to match lung function. RNCM talked with MD about pt being unable to use her flutter valve related to not having enough breath. MD discussed with pt other options to increased sputum clearance, but she was unable to tolerate the other options. MD discussed hospice with pt, patient again stated, "I am not ready for that." Pt felt she had questions answered.   RNCM walked with pt out to her car, reviewed all the plans both MDs had talked about. Reminded pt not to have Bactrim filled related to previous allergic reaction. Pt noted to be SOB ambulating from office to car, reminded her she could turn O2 up with activity, pt verbalized understanding. RNCM and pt discussed pt with call RNCM with Dr. Jarrett Ables new plan for her antibiotic.   Plan: RNCM will wait for pt to call with ID MD treatment plan. RNCM will see pt as previously planed in 3 weeks.   Costella Hatcher RN, BSN  [facility] Care Management [phone])

## 2015-04-12 ENCOUNTER — Other Ambulatory Visit: Payer: Self-pay | Admitting: *Deleted

## 2015-04-12 NOTE — Patient Outreach (Signed)
Received a call from [facility]) that  pt called [facility] office/ pt currently on the phone, c/o of chest pain-  her regular RN CM Janci Minor is out today, this RN CM covering.  Spoke with pt who questions if have pneumonia, chest so tight,not getting any better. Pt states it has been bad all week, coughing up secretions, sometimes yellow/sometimes clear.  Pt states she is to see her sputum MD Dr. Sampson Goon next week, gave her medicine to take two times a day.  RN CM discussed with pt to call Primary Care MD to which pt states would rather call Dr. Meredeth Ide.  Requested pt call RN CM  Back after calling MD office to which she said she would(has nurse's contact number).      Shayne Alken.   Pierzchala RN CCM [facility] Care Management  [phone]

## 2015-04-12 NOTE — Patient Outreach (Signed)
Received a call from pt (this RN CM covering for [employer]), reports Dr. Sampson Goon called her back, MD to call in a Zpak (Zithromycin) and up her Prednisone.  Pt states she is scheduled to f/u with Dr. Sampson Goon 10/4, he is the one who  prescribed her  Bactrim - will be through with it by 10/4.  Pt states her pharmacy sends out her medications.   Pt reports secretions are not as yellow.  RN CM discussed with pt if no improvement is seen in 1-2 days, to f/u with MD again or ED.  This RN CM will provide report to [facility] RN upon her return.        Shayne Alken.   Pierzchala RN CCM [facility] Care Management  [phone]

## 2015-04-12 NOTE — Patient Outreach (Signed)
Follow up call:  This RN CM (covering for Janci Minor RN, pt's regular nurse) called pt to see if able to contact Dr. Reita Cliche office to which pt states did call office,Dr. Meredeth Ide not in, given to Dr. Sampson Goon- not heard back yet.   Pt reports she did relay on call to MD office her symptoms.   As discussed, pt to call RN CM when hear back from MD office.     Shayne Alken.   Pierzchala RN CCM [facility] Care Management  [phone]

## 2015-04-17 ENCOUNTER — Other Ambulatory Visit: Payer: Self-pay | Admitting: *Deleted

## 2015-04-17 NOTE — Patient Outreach (Signed)
[facility]) Care Management   04/17/2015  Carla Harris 1929/05/30 [id number]  Carla Harris is an 81 y.o. female  Subjective: " I just keep losing weight and I don't know what to do?" "I am ok, I do not want to go to the doctor or hospital about my fall."   Objective: Blood pressure 110/60, pulse 56, resp. rate 16, height 1.524 m (5'), weight 95 lb 9.6 oz (43.364 kg), last menstrual period 07/01/1969, SpO2 96 %.   Review of Systems  Constitutional: Positive for weight loss.  HENT: Positive for congestion.   Respiratory: Positive for cough, sputum production, shortness of breath and wheezing.   Cardiovascular: Positive for leg swelling.  Musculoskeletal: Positive for falls.  Endo/Heme/Allergies: Bruises/bleeds easily.  Psychiatric/Behavioral: Positive for depression.    Physical Exam  Constitutional: She is oriented to person, place, and time.  HENT:  Mouth/Throat: Oral lesions present.    Cardiovascular: Normal rate and regular rhythm.   Pulses:      Dorsalis pedis pulses are 2+ on the right side, and 2+ on the left side.  Respiratory: She has wheezes. She has rales.  GI: Soft. Bowel sounds are normal.  Musculoskeletal:       Right ankle: She exhibits swelling.       Left ankle: She exhibits swelling.  Neurological: She is alert and oriented to person, place, and time.  Skin: Skin is warm and dry. Bruising noted.       Current Medications:   Current Outpatient Prescriptions  Medication Sig Dispense Refill  . acetylcysteine (MUCOMYST) 20 % nebulizer solution Take 2 mLs by nebulization 2 (two) times daily.    Marland Kitchen albuterol (PROVENTIL) (2.5 MG/3ML) 0.083% nebulizer solution Inhale into the lungs.    Marland Kitchen albuterol-ipratropium (COMBIVENT) 18-103 MCG/ACT inhaler Inhale 2 puffs into the lungs every 6 (six) hours as needed.    . cetirizine (ZYRTEC) 10 MG tablet Take 10 mg by mouth daily.    . Cholecalciferol (VITAMIN D3) 2000 UNITS capsule Take 2,000 Units by  mouth daily.    Marland Kitchen esomeprazole (NEXIUM) 40 MG capsule Take 40 mg by mouth daily before breakfast. Take 1 capsule twice a day    . Fluticasone-Salmeterol (ADVAIR) 250-50 MCG/DOSE AEPB Inhale 1 puff into the lungs every 12 (twelve) hours.    . furosemide (LASIX) 20 MG tablet Take 20 mg by mouth every other day.    . hydrOXYzine (ATARAX/VISTARIL) 25 MG tablet Take 25 mg by mouth 3 (three) times daily as needed.    . metFORMIN (GLUCOPHAGE) 500 MG tablet Take 1 tablet (500 mg total) by mouth 2 (two) times daily with a meal. Two times a day with meal. (Patient taking differently: Take 500 mg by mouth daily with breakfast. Two times a day with meal.) 180 tablet 3  . montelukast (SINGULAIR) 10 MG tablet Take 10 mg by mouth at bedtime.    . Multiple Vitamin (MULTIVITAMIN) tablet Take 1 tablet by mouth daily.    Letta Pate DELICA LANCETS 33G MISC Test sugars three times daily 100 each 11  . predniSONE (DELTASONE) 10 MG tablet Take 1 tablet (10 mg total) by mouth daily. Take 60 mg on day 1, then decrease by 10 mg daily until gone 21 tablet 0  . predniSONE (DELTASONE) 20 MG tablet Take 10 mg by mouth 2 (two) times daily with a meal.     . rivaroxaban (XARELTO) 10 MG TABS tablet Take 15 mg by mouth daily.     Marland Kitchen  sotalol (BETAPACE) 80 MG tablet Take 160 mg by mouth 2 (two) times daily.     Marland Kitchen sulfamethoxazole-trimethoprim (BACTRIM DS,SEPTRA DS) 800-160 MG tablet Take 1 tablet by mouth 2 (two) times daily.    . theophylline (THEODUR) 200 MG 12 hr tablet Take 200 mg by mouth 2 (two) times daily.    Marland Kitchen albuterol (PROVENTIL HFA;VENTOLIN HFA) 108 (90 BASE) MCG/ACT inhaler Inhale 1 puff into the lungs every 6 (six) hours as needed for wheezing or shortness of breath.     No current facility-administered medications for this visit.    Functional Status:   In your present state of health, do you have any difficulty performing the following activities: 03/16/2015 10/14/2014  Hearing? N N  Vision? N N  Difficulty  concentrating or making decisions? N N  Walking or climbing stairs? Y Y  Dressing or bathing? N N  Doing errands, shopping? Carla Harris  Preparing Food and eating ? N N  Using the Toilet? N N  In the past six months, have you accidently leaked urine? N N  Do you have problems with loss of bowel control? N N  Managing your Medications? N Y  Managing your Finances? N N  Housekeeping or managing your Housekeeping? Carla Harris    Fall/Depression Screening:    PHQ 2/9 Scores 03/16/2015 10/14/2014 10/14/2014 07/27/2013 07/10/2012  PHQ - 2 Score 0  PHQ- 9 Score 16 12 - 5 -   Fall Risk  04/17/2015 03/16/2015 10/14/2014 07/27/2013 07/10/2012  Falls in the past year? Yes Yes Yes Yes No  Number falls in past yr: 2 or more 1 1 - -  Injury with Fall? Yes No No - -  Risk Factor Category  High Fall Risk High Fall Risk High Fall Risk - -  Risk for fall due to : History of fall(s);Impaired balance/gait;Medication side effect History of fall(s);Impaired balance/gait;Medication side effect Impaired balance/gait;Medication side effect - -  Follow up Falls evaluation completed;Education provided;Falls prevention discussed Education provided;Falls prevention discussed Falls prevention discussed - -   Assessment: See physical assessment as above.  General: Pt continuing to look thin and frail. Weight down to 95 lbs. Pt continues to be unwilling to accept hospice care. RNCM discussed ways to add calories to pt's diet with pt and son. Son reports he tries to get her to eat but is unsuccessful most of the time.  HF: Pt's feet are mildly swollen. Pt reports SOB but stated it was related to COPD. Poor appetite. Denies being unable to lie flat to sleep. COPD: Pt continues on 21 day antibiotic course prescribed by ID MD without s/s of allergic reaction. Pt just completed a 5 day course of a Z-pack for increased sputum, cough and wheezing. Pt stated she felt some better but was still feeling tired. Pt continues to smoke.  DM: Pt had  only checked sugar one time this month. Bilateral feet with skin intact and +2 pulses bilaterally. At the end of RNCM visit pt took a step backwards, stumbled and fell. She hit her right arm and right upper leg on a nearby coffee table sustaining 2 long thin skin tears described in above physical assessment. RNCM cleansed and dressed areas. No obvious bone displacement noted, pt able to stand and bear weight without pain to leg or hip. Arm with out swelling. Pt refused EMS or doctor evaluation. RNCM contacted MD to make her aware of pt fall and ask for MD recommendations on dressing the areas.  MD ordered telfa to the area and wrap with kerlix to prevent tape being placed to the skin. RNCM also let MD know she would contact pt later in the day to check on her after fall. RNCM reeducated pt and son on fall prevention and son stated he was planning on being with his mom the rest of the day.     Plan: RNCM will contact pt later today to check on her after fall.  RNCM will update MD of pt status. RNCM will see pt in one month for routine home visit.  Costella Hatcher RN, BSN  [facility] Care Management [phone])

## 2015-04-18 ENCOUNTER — Other Ambulatory Visit: Payer: Self-pay | Admitting: *Deleted

## 2015-04-18 NOTE — Patient Outreach (Signed)
RNCM went to see pt related to pt falling earlier during [facility] visit. RNCM noted pt was ambulating well without limp or c/o joint pain. RNCM provided pt with more dressing supplies and reinforced with pt and son the importance of keeping areas clean and dry. RNCM educated pt and son s/s of infection and when to call the MD. Pt made aware that MD was aware of fall and was willing to see pt if she wanted to go. Pt continued to refuse to go to MD stating "I will call if I feel I need to go." Pt verbalized understanding of s/s of infection and reasons to call MD such as swelling, increased pain, or pain with weight bearing.   RNCM made MD aware of pt update via in-basket and response received. No further orders.  Plan: RNCM will call pt at the end of the week to check on her.  Costella Hatcher RN, BSN  [facility] Care Management [phone])

## 2015-05-12 ENCOUNTER — Other Ambulatory Visit: Payer: Self-pay | Admitting: *Deleted

## 2015-05-12 NOTE — Patient Outreach (Signed)
RNCM received a phone call from pt stating she was unable to find the dermatology office the day of her appt. Which was Wednesday. She stated her son was driving and neither she nor him could find the office. She decided not to reschedule appt. Pt stated she had seen her pulmonologist on yesterday (10/29) and he started her on another antibiotic. Pt stated she was going to pick it up today, she stated she was feeling "tight" this morning. She stated she was coughing up the same amount of mucus and it was yellow/white sometimes thick. RNCM read the instructions to pt about the new prescription found in EPIC MD note. Pt verbalized understanding.   Pt talked about feeling depressed but did not want to take any more medication such as an anti depressant. RNCM asked pt if she was interested in having a palliative care team and she refused. RNCM suggested some ways to to lift mood such as taking a walk, getting some sunshine, or talking with a friend. Pt stated she was not interested in a phone call from a counselor, such as life sync. Pt stated she did not know why she felt so blue but didn't want to discuss it any more. Pt has refused assistance with depression in the past. RNCM offered options to pt and she continued to refuse.    Plan: RNCM will see pt next week at regular scheduled appt. Nov 1.  RNCM will complete depression screening at visit.   Costella HatcherJanci Tyronn Golda RN, BSN  [facility] Care Management [phone])

## 2015-05-16 ENCOUNTER — Encounter: Payer: Self-pay | Admitting: *Deleted

## 2015-05-16 ENCOUNTER — Other Ambulatory Visit: Payer: Self-pay | Admitting: *Deleted

## 2015-05-16 NOTE — Patient Outreach (Signed)
[facility]) Care Management   05/16/2015  ALIZEA PELL Jul 27, 1928 [id number]  Manon C Dorgan is an 81 y.o. female  Subjective: "My lung doctor started me on Zithromax and I am feeling some better, he wants me to take it for 30 days." "I know I should be using this flutter valve but it makes me light headed and I can hardly blow into it."  Objective: Blood pressure 116/60, pulse 70, resp. rate 20, weight 99 lb (44.906 kg), last menstrual period 07/01/1969, SpO2 97 %.  Review of Systems  HENT: Positive for congestion.   Respiratory: Positive for cough, sputum production and wheezing.   Cardiovascular: Positive for leg swelling.  Musculoskeletal: Negative for falls.  Psychiatric/Behavioral: Positive for depression.    Physical Exam  Constitutional: She appears well-developed. She is cooperative.  Thin and frail in appearance  Cardiovascular: Normal rate and regular rhythm.   Pulses:      Dorsalis pedis pulses are 1+ on the right side, and 1+ on the left side.  Respiratory: She has rhonchi.  GI: Soft. Bowel sounds are normal.  Musculoskeletal:       Right ankle: She exhibits swelling.       Left ankle: She exhibits swelling.  Neurological: She is alert.  Skin: Skin is warm, dry and intact.  Two areas from previous fall healed one to right forearm and one to right thigh. Skin intact to bilateral feet. Feet cool to touch.  Psychiatric: Her speech is normal and behavior is normal. Judgment and thought content normal. Cognition and memory are normal. She exhibits a depressed mood.    Current Medications:   Current Outpatient Prescriptions  Medication Sig Dispense Refill  . acetylcysteine (MUCOMYST) 20 % nebulizer solution Take 2 mLs by nebulization 2 (two) times daily.    Marland Kitchen albuterol (PROVENTIL HFA;VENTOLIN HFA) 108 (90 BASE) MCG/ACT inhaler Inhale 1 puff into the lungs every 6 (six) hours as needed for wheezing or shortness of breath.    Marland Kitchen albuterol (PROVENTIL)  (2.5 MG/3ML) 0.083% nebulizer solution Inhale into the lungs.    Marland Kitchen albuterol-ipratropium (COMBIVENT) 18-103 MCG/ACT inhaler Inhale 2 puffs into the lungs every 6 (six) hours as needed.    Marland Kitchen azithromycin (ZITHROMAX) 250 MG tablet Take 500 mg by mouth 3 (three) times a week. Take  Mondays, Wednesdays, and Fridays for 30 days    . cetirizine (ZYRTEC) 10 MG tablet Take 10 mg by mouth daily.    . Cholecalciferol (VITAMIN D3) 2000 UNITS capsule Take 2,000 Units by mouth daily.    Marland Kitchen esomeprazole (NEXIUM) 40 MG capsule Take 40 mg by mouth daily before breakfast. Take 1 capsule twice a day    . Fluticasone-Salmeterol (ADVAIR) 250-50 MCG/DOSE AEPB Inhale 1 puff into the lungs every 12 (twelve) hours.    . furosemide (LASIX) 20 MG tablet Take 20 mg by mouth every other day.    . hydrOXYzine (ATARAX/VISTARIL) 25 MG tablet Take 25 mg by mouth 3 (three) times daily as needed.    . metFORMIN (GLUCOPHAGE) 500 MG tablet Take 1 tablet (500 mg total) by mouth 2 (two) times daily with a meal. Two times a day with meal. (Patient taking differently: Take 500 mg by mouth daily with breakfast. Two times a day with meal.) 180 tablet 3  . montelukast (SINGULAIR) 10 MG tablet Take 10 mg by mouth at bedtime.    . Multiple Vitamin (MULTIVITAMIN) tablet Take 1 tablet by mouth daily.    Letta Pate DELICA LANCETS 33G  MISC Test sugars three times daily 100 each 11  . predniSONE (DELTASONE) 20 MG tablet Take 10 mg by mouth 2 (two) times daily with a meal.     . rivaroxaban (XARELTO) 10 MG TABS tablet Take 15 mg by mouth daily.     . sotalol (BETAPACE) 80 MG tablet Take 160 mg by mouth 2 (two) times daily.     . theophylline (THEODUR) 200 MG 12 hr tablet Take 200 mg by mouth 2 (two) times daily.    . predniSONE (DELTASONE) 10 MG tablet Take 1 tablet (10 mg total) by mouth daily. Take 60 mg on day 1, then decrease by 10 mg daily until gone (Patient not taking: Reported on 05/16/2015) 21 tablet 0   No current  facility-administered medications for this visit.    Functional Status:   In your present state of health, do you have any difficulty performing the following activities: 03/16/2015 10/14/2014  Hearing? N N  Vision? N N  Difficulty concentrating or making decisions? N N  Walking or climbing stairs? Y Y  Dressing or bathing? N N  Doing errands, shopping? Malvin JohnsY Y  Preparing Food and eating ? N N  Using the Toilet? N N  In the past six months, have you accidently leaked urine? N N  Do you have problems with loss of bowel control? N N  Managing your Medications? N Y  Managing your Finances? N N  Housekeeping or managing your Housekeeping? Malvin JohnsY Y    Depression Screening:    PHQ 2/9 Scores 05/16/2015 03/16/2015 10/14/2014 10/14/2014 07/27/2013 07/10/2012  PHQ - 2 Score 5 5 4 4 2  0  PHQ- 9 Score 16 16 12  - 5 -    Assessment: See physical assessment as above.    COPD:Pt talked with RNCM about new script for antibiotics. RNCM educated pt on s/s of complications with antibiotics such as stomach upset, thrush, and diarrhea. Pt verbalized understanding. RNCM discussed with pt the importance of using the flutter valve and requested pt show RNCM her flutter valve. RNCM looked up her exact flutter valve to make sure it was set to the easiest setting. RNCM showed pt a video on proper technique using the flutter valve. Pt tried the flutter valve after reset and stated it was easier. Pt made a goal to use once a day to assist in moving secretions.   Depression: Pt scores high with PHQ screening. Discussed options for depression with pt and she refused. She stated she had a lot of worries on her mind and because all she felt like doing was sit around she had too much time to think about these things. RNCM offered counseling as an option and pt stated she didn't want to talk to a counselor. Denies suicidal ideation.   HF: Pt weight conts at 99. bilat feet puffy right at ankles related to tight socks, conts lasix as  directed. Plans to see cardiologist next week. Denies increased SOB, weight gain, or inability to lie flat.   Falls: No falls this month, healed areas from previous falls.   Plan: Appointment made with pt to f/u in one month. RNCM will make PCP aware of depression screening.  Costella HatcherJanci Aarian Cleaver RN, BSN  [facility] Care Management [phone])

## 2015-05-31 DIAGNOSIS — M4850XA Collapsed vertebra, not elsewhere classified, site unspecified, initial encounter for fracture: Secondary | ICD-10-CM

## 2015-05-31 HISTORY — DX: Collapsed vertebra, not elsewhere classified, site unspecified, initial encounter for fracture: M48.50XA

## 2015-06-12 ENCOUNTER — Emergency Department: Payer: Medicare Other

## 2015-06-12 ENCOUNTER — Other Ambulatory Visit: Payer: Self-pay | Admitting: *Deleted

## 2015-06-12 ENCOUNTER — Encounter: Payer: Self-pay | Admitting: Emergency Medicine

## 2015-06-12 ENCOUNTER — Emergency Department
Admission: EM | Admit: 2015-06-12 | Discharge: 2015-06-12 | Disposition: A | Discharge status: Home / Self Care | Payer: Medicare Other | Attending: Emergency Medicine | Admitting: Emergency Medicine

## 2015-06-12 ENCOUNTER — Other Ambulatory Visit
Admission: RE | Admit: 2015-06-12 | Discharge: 2015-06-12 | Disposition: A | Discharge status: Home / Self Care | Payer: Medicare Other | Source: Ambulatory Visit | Attending: Specialist | Admitting: Specialist

## 2015-06-12 ENCOUNTER — Other Ambulatory Visit: Payer: Self-pay

## 2015-06-12 DIAGNOSIS — M6281 Muscle weakness (generalized): Secondary | ICD-10-CM | POA: Insufficient documentation

## 2015-06-12 DIAGNOSIS — F17210 Nicotine dependence, cigarettes, uncomplicated: Secondary | ICD-10-CM | POA: Insufficient documentation

## 2015-06-12 DIAGNOSIS — Z79899 Other long term (current) drug therapy: Secondary | ICD-10-CM | POA: Insufficient documentation

## 2015-06-12 DIAGNOSIS — J441 Chronic obstructive pulmonary disease with (acute) exacerbation: Secondary | ICD-10-CM | POA: Diagnosis not present

## 2015-06-12 DIAGNOSIS — M25511 Pain in right shoulder: Secondary | ICD-10-CM | POA: Diagnosis not present

## 2015-06-12 DIAGNOSIS — Z792 Long term (current) use of antibiotics: Secondary | ICD-10-CM | POA: Insufficient documentation

## 2015-06-12 DIAGNOSIS — Z9981 Dependence on supplemental oxygen: Secondary | ICD-10-CM | POA: Insufficient documentation

## 2015-06-12 DIAGNOSIS — Z7984 Long term (current) use of oral hypoglycemic drugs: Secondary | ICD-10-CM | POA: Insufficient documentation

## 2015-06-12 DIAGNOSIS — Z7901 Long term (current) use of anticoagulants: Secondary | ICD-10-CM | POA: Insufficient documentation

## 2015-06-12 DIAGNOSIS — Z7951 Long term (current) use of inhaled steroids: Secondary | ICD-10-CM | POA: Diagnosis not present

## 2015-06-12 DIAGNOSIS — Z880 Allergy status to penicillin: Secondary | ICD-10-CM | POA: Insufficient documentation

## 2015-06-12 DIAGNOSIS — E119 Type 2 diabetes mellitus without complications: Secondary | ICD-10-CM | POA: Diagnosis not present

## 2015-06-12 DIAGNOSIS — R0602 Shortness of breath: Secondary | ICD-10-CM | POA: Diagnosis present

## 2015-06-12 LAB — HEPATIC FUNCTION PANEL
ALK PHOS: 57 U/L (ref 38–126)
ALT: 7 U/L — AB (ref 14–54)
AST: 14 U/L — AB (ref 15–41)
Albumin: 3.3 g/dL — ABNORMAL LOW (ref 3.5–5.0)
Bilirubin, Direct: <0.1 mg/dL — ABNORMAL LOW (ref 0.1–0.5)
TOTAL PROTEIN: 6.5 g/dL (ref 6.5–8.1)
Total Bilirubin: 0.4 mg/dL (ref 0.3–1.2)

## 2015-06-12 LAB — APTT: aPTT: 32 seconds (ref 24–36)

## 2015-06-12 LAB — CBC
HEMATOCRIT: 39.8 % (ref 35.0–47.0)
Hemoglobin: 12.7 g/dL (ref 12.0–16.0)
MCH: 28 pg (ref 26.0–34.0)
MCHC: 31.9 g/dL — ABNORMAL LOW (ref 32.0–36.0)
MCV: 87.9 fL (ref 80.0–100.0)
PLATELETS: 288 10*3/uL (ref 150–440)
RBC: 4.53 MIL/uL (ref 3.80–5.20)
RDW: 15.5 % — AB (ref 11.5–14.5)
WBC: 12.6 10*3/uL — AB (ref 3.6–11.0)

## 2015-06-12 LAB — BASIC METABOLIC PANEL
ANION GAP: 9 (ref 5–15)
BUN: 18 mg/dL (ref 6–20)
CO2: 26 mmol/L (ref 22–32)
Calcium: 9 mg/dL (ref 8.9–10.3)
Chloride: 101 mmol/L (ref 101–111)
Creatinine, Ser: 0.89 mg/dL (ref 0.44–1.00)
GFR calc Af Amer: >60 mL/min (ref >60)
GFR, EST NON AFRICAN AMERICAN: 58 mL/min — AB (ref >60)
GLUCOSE: 330 mg/dL — AB (ref 65–99)
POTASSIUM: 4.1 mmol/L (ref 3.5–5.1)
Sodium: 136 mmol/L (ref 135–145)

## 2015-06-12 LAB — FIBRIN DERIVATIVES D-DIMER (ARMC ONLY): Fibrin derivatives D-dimer (ARMC): 1695 — ABNORMAL HIGH (ref 0–499)

## 2015-06-12 LAB — PROTIME-INR
INR: 1.22
Prothrombin Time: 15.6 seconds — ABNORMAL HIGH (ref 11.4–15.0)

## 2015-06-12 MED ORDER — PREDNISONE 20 MG PO TABS: 20.0000 mg | ORAL_TABLET | Freq: Every day | ORAL | Status: AC

## 2015-06-12 MED ORDER — IOHEXOL 350 MG/ML SOLN
75.0000 mL | Freq: Once | INTRAVENOUS | Status: AC | PRN
  Administered 2015-06-12: 75 mL via INTRAVENOUS

## 2015-06-12 MED ORDER — IPRATROPIUM-ALBUTEROL 0.5-2.5 (3) MG/3ML IN SOLN
3.0000 mL | Freq: Once | RESPIRATORY_TRACT | Status: AC
  Administered 2015-06-12: 3 mL via RESPIRATORY_TRACT
  Filled 2015-06-12: qty 3

## 2015-06-12 MED ORDER — INSULIN ASPART 100 UNIT/ML ~~LOC~~ SOLN
10.0000 [IU] | Freq: Once | SUBCUTANEOUS | Status: AC
  Administered 2015-06-12: 10 [IU] via SUBCUTANEOUS
  Filled 2015-06-12: qty 10

## 2015-06-12 MED ORDER — FENTANYL CITRATE (PF) 100 MCG/2ML IJ SOLN
25.0000 ug | Freq: Once | INTRAMUSCULAR | Status: AC
  Administered 2015-06-12: 25 ug via INTRAVENOUS
  Filled 2015-06-12: qty 2

## 2015-06-12 MED ORDER — OXYCODONE-ACETAMINOPHEN 5-325 MG PO TABS
1.0000 | ORAL_TABLET | Freq: Once | ORAL | Status: AC
  Administered 2015-06-12: 1 via ORAL
  Filled 2015-06-12: qty 1

## 2015-06-12 MED ORDER — PREDNISONE 20 MG PO TABS: 60.0000 mg | ORAL_TABLET | Freq: Every day | ORAL | Status: DC

## 2015-06-12 NOTE — ED Provider Notes (Signed)
[facility] Emergency Department Provider Note  ____________________________________________  Time seen: Approximately 6:31 PM  I have reviewed the triage vital signs and the nursing notes.   HISTORY  Chief Complaint Shortness of Breath    HPI Carla Harris is a 81 y.o. female with a history of COPD on 2 L nasal cannula baseline, status post AAA repair remotely, DM, presenting with right scapular pain and cough. Patient reports that since Saturday morning, she has had a severe pain behind the right scapula. It is worse with deep breaths or positional changes. She has no associated palpitations, lightheadedness, syncope, lower extremity swelling, or calf pain. She does have a chronic productive cough and shortness of breath which she feels is mildly worse at this time. She was evaluated by her pulmonoloist Dr. Meredeth Ide, who referred her to the emergency department for evaluation for possible PE.   Past Medical History  Diagnosis Date  . Anemia   . Diabetes mellitus without complication (HCC)   . Bronchiectasis (HCC)   . Colon polyps   . Hypercholesteremia   . S/P AAA repair   . Right rotator cuff tear   . COPD (chronic obstructive pulmonary disease) (HCC)   . Low serum IgG1 and IgM levels   . Oxygen deficiency   . Cataract     resolved with surgery    Patient Active Problem List   Diagnosis Date Noted  . Weight loss 03/19/2015  . Scalp lesion 03/19/2015  . CAD (coronary artery disease) 11/30/2014  . Dizziness 07/01/2014  . Fatigue 07/01/2014  . Hypoxia 04/24/2014  . Adnexal mass 04/24/2014  . Tobacco abuse 02/07/2014  . Acute and chronic respiratory failure 01/13/2014  . GERD (gastroesophageal reflux disease) 01/13/2014  . Atrial fibrillation (HCC) 09/25/2013  . Edema 03/14/2013  . COPD (chronic obstructive pulmonary disease) (HCC) 07/01/2012  . Bronchiectasis (HCC) 07/01/2012  . Hypercholesterolemia 07/01/2012  . Anemia 07/01/2012  .  History of colon polyps 07/01/2012  . Diabetes mellitus (HCC) 07/01/2012  . Osteoporosis 07/01/2012    Past Surgical History  Procedure Laterality Date  . Appendectomy    . Abdominal hysterectomy    . Abdominal aortic aneurysm repair    . Rotator cuff repair      Current Outpatient Rx  Name  Route  Sig  Dispense  Refill  . acetylcysteine (MUCOMYST) 20 % nebulizer solution   Nebulization   Take 2 mLs by nebulization 2 (two) times daily.         Marland Kitchen albuterol (PROVENTIL HFA;VENTOLIN HFA) 108 (90 BASE) MCG/ACT inhaler   Inhalation   Inhale 1 puff into the lungs every 6 (six) hours as needed for wheezing or shortness of breath.         Marland Kitchen albuterol (PROVENTIL) (2.5 MG/3ML) 0.083% nebulizer solution   Inhalation   Inhale into the lungs.         Marland Kitchen albuterol-ipratropium (COMBIVENT) 18-103 MCG/ACT inhaler   Inhalation   Inhale 2 puffs into the lungs every 6 (six) hours as needed.         Marland Kitchen azithromycin (ZITHROMAX) 250 MG tablet   Oral   Take 500 mg by mouth 3 (three) times a week. Take  Mondays, Wednesdays, and Fridays for 30 days         . cetirizine (ZYRTEC) 10 MG tablet   Oral   Take 10 mg by mouth daily.         . Cholecalciferol (VITAMIN D3) 2000 UNITS capsule   Oral  Take 2,000 Units by mouth daily.         Marland Kitchen esomeprazole (NEXIUM) 40 MG capsule   Oral   Take 40 mg by mouth daily before breakfast. Take 1 capsule twice a day         . Fluticasone-Salmeterol (ADVAIR) 250-50 MCG/DOSE AEPB   Inhalation   Inhale 1 puff into the lungs every 12 (twelve) hours.         . furosemide (LASIX) 20 MG tablet   Oral   Take 20 mg by mouth every other day.         . hydrOXYzine (ATARAX/VISTARIL) 25 MG tablet   Oral   Take 25 mg by mouth 3 (three) times daily as needed.         . metFORMIN (GLUCOPHAGE) 500 MG tablet   Oral   Take 1 tablet (500 mg total) by mouth 2 (two) times daily with a meal. Two times a day with meal. Patient taking differently:  Take 500 mg by mouth daily with breakfast. Two times a day with meal.   180 tablet   3   . montelukast (SINGULAIR) 10 MG tablet   Oral   Take 10 mg by mouth at bedtime.         . Multiple Vitamin (MULTIVITAMIN) tablet   Oral   Take 1 tablet by mouth daily.         Letta Pate DELICA LANCETS 33G MISC      Test sugars three times daily   100 each   11   . predniSONE (DELTASONE) 20 MG tablet   Oral   Take 1 tablet (20 mg total) by mouth daily.   30 tablet   0   . predniSONE (DELTASONE) 20 MG tablet   Oral   Take 3 tablets (60 mg total) by mouth daily.   15 tablet   0   . rivaroxaban (XARELTO) 10 MG TABS tablet   Oral   Take 15 mg by mouth daily.          . sotalol (BETAPACE) 80 MG tablet   Oral   Take 160 mg by mouth 2 (two) times daily.          . theophylline (THEODUR) 200 MG 12 hr tablet   Oral   Take 200 mg by mouth 2 (two) times daily.           Allergies Advil; Ciprofloxacin; Clindamycin/lincomycin; Levaquin; Penicillins; Septra; Sulfa antibiotics; and Doxycycline  Family History  Problem Relation Age of Onset  . COPD Mother   . Liver cancer Sister   . Colon cancer Maternal Grandmother   . Diabetes Daughter     Social History Social History  Substance Use Topics  . Smoking status: Current Every Day Smoker    Types: Cigarettes  . Smokeless tobacco: Never Used     Comment: Has decreased to about 5 a day  . Alcohol Use: No    Review of Systems Constitutional: No fever/chills. No lightheadedness or syncope. Positive generalized weakness and malaise. Eyes: No visual changes. ENT: No sore throat. Cardiovascular: Denies chest pain, palpitations. Positive pain over the scapula. Respiratory: Positive shortness of breath.  Positive productive cough. Gastrointestinal: No abdominal pain.  No nausea, no vomiting.  No diarrhea.  No constipation. Genitourinary: Negative for dysuria. Musculoskeletal: Negative for back pain. Skin: Negative for  rash. Neurological: Negative for headaches, focal weakness or numbness.  10-point ROS otherwise negative.  ____________________________________________   PHYSICAL EXAM:  VITAL SIGNS: ED  Triage Vitals  Enc Vitals Group     BP --      Pulse --      Resp --      Temp --      Temp src --      SpO2 --      Weight --      Height --      Head Cir --      Peak Flow --      Pain Score 06/12/15 1815 10     Pain Loc --      Pain Edu? --      Excl. in GC? --     Constitutional: Chronically ill appearing patient lying comfortably in the stretcher. Alert and oriented. Well appearing and in no acute distress. Answer question appropriately. Eyes: Conjunctivae are normal.  EOMI. no discharge. Head: Atraumatic. Nose: No congestion/rhinnorhea. Mouth/Throat: Mucous membranes are moist.  Neck: No stridor.  Supple.  No JVD. Cardiovascular: Normal rate, regular rhythm. No murmurs, rubs or gallops.  Respiratory: Tachypneic with accessory muscle use but no retractions. Expiratory greater than inspiratory wheezes with a prolonged expiratory phase. No rales or rhonchi.  Gastrointestinal: Soft and nontender. No distention. No peritoneal signs. Musculoskeletal: No LE edema. No calf pain, palpable cords or Homans sign. Neurologic:  Normal speech and language. No gross focal neurologic deficits are appreciated.  Skin:  Skin is warm, dry and intact. No rash noted. Psychiatric: Mood and affect are normal. Speech and behavior are normal.  Normal judgement.  ____________________________________________   LABS (all labs ordered are listed, but only abnormal results are displayed)  Labs Reviewed  CBC - Abnormal; Notable for the following:    WBC 12.6 (*)    MCHC 31.9 (*)    RDW 15.5 (*)    All other components within normal limits  BASIC METABOLIC PANEL - Abnormal; Notable for the following:    Glucose, Bld 330 (*)    GFR calc non Af Amer 58 (*)    All other components within normal limits   PROTIME-INR - Abnormal; Notable for the following:    Prothrombin Time 15.6 (*)    All other components within normal limits  APTT  HEPATIC FUNCTION PANEL   ____________________________________________  EKG  ED ECG REPORT I, Rockne Menghini, the attending physician, personally viewed and interpreted this ECG.   Date: 06/12/2015  EKG Time: 1821  Rate: 80  Rhythm: normal sinus rhythm  Axis: Leftward  Intervals:none  ST&T Change: Nonspecific T-wave inversions in V1 and V2 and V3. No ST elevation.  ____________________________________________  RADIOLOGY  Ct Angio Chest Pe W/cm &/or Wo Cm  06/12/2015  CLINICAL DATA:  Posterior right chest pain in the region of the scapula. Chronic shortness breath. EXAM: CT ANGIOGRAPHY CHEST WITH CONTRAST TECHNIQUE: Multidetector CT imaging of the chest was performed using the standard protocol during bolus administration of intravenous contrast. Multiplanar CT image reconstructions and MIPs were obtained to evaluate the vascular anatomy. CONTRAST:  75mL OMNIPAQUE IOHEXOL 350 MG/ML SOLN COMPARISON:  Portable chest dated 01/06/2015 and chest CTA dated 01/12/2014. FINDINGS: Mediastinum/Lymph Nodes: No pulmonary emboli or thoracic aortic dissection identified. No masses or pathologically enlarged lymph nodes identified. Lungs/Pleura: The lungs are hyperexpanded with extensive bullous changes, most pronounced in the upper lobes. Linear atelectasis in both lower lung zones. No lung nodules. Upper abdomen: Possible partially included gallstone in the gallbladder on the last image. Musculoskeletal: Interval 30% compression deformities involving the T3, T6 and T9 vertebral bodies. No bony  retropulsion or acute fracture lines. Mild thoracic spine degenerative changes. Review of the MIP images confirms the above findings. IMPRESSION: 1. No pulmonary emboli. 2. Bibasilar linear atelectasis. 3. Possible partially included gallstone in the gallbladder.  Electronically Signed   By: Beckie SaltsSteven  Reid M.D.   On: 06/12/2015 19:24    ____________________________________________   PROCEDURES  Procedure(s) performed: None  Critical Care performed: No ____________________________________________   INITIAL IMPRESSION / ASSESSMENT AND PLAN / ED COURSE  Pertinent labs & imaging results that were available during my care of the patient were reviewed by me and considered in my medical decision making (see chart for details).  81 y.o. female with a history of COPD presenting for right scapular pain that is worse with deep breaths and shortness of breath with cough. On my exam, the patient has an abnormal pulmonary exam which may be consistent with fluid or infection, as well as COPD flare. We will evaluate the patient for possible PE. Plan for reevaluation after imaging.  ----------------------------------------- 7:39 PM on 06/12/2015 -----------------------------------------  The patient was reporting 0 out of 10 pain until I made her change position. Her CAT scan does not show any evidence of PE or other causes for pain.  There is a possible gallstone in the gallbladder but on reexamination I am unable to elicit any pain in the patient's abdomen, she has negative Murphy sign. She has not recently been having any pain in the right upper quadrant nor any nausea or vomiting. I have let her know about these findings and will add a hepatic function panel to her labs. If the LFTs look normal, we will not proceed with any further imaging and she can follow up with her primary care physician about this possible finding. I will plan to continue her treatment for COPD flare with steroids.  ____________________________________________  FINAL CLINICAL IMPRESSION(S) / ED DIAGNOSES  Final diagnoses:  COPD with acute exacerbation (HCC)  Right shoulder pain      NEW MEDICATIONS STARTED DURING THIS VISIT:  New Prescriptions   PREDNISONE (DELTASONE) 20 MG  TABLET    Take 1 tablet (20 mg total) by mouth daily.     Rockne MenghiniAnne-Caroline Nadiyah Zeis, MD 06/12/15 2154

## 2015-06-12 NOTE — Discharge Instructions (Signed)
Please return to the emergency department if he develops fainting, shortness of breath, chest pain, fever or any other symptoms concerning to you.  If you develop pain in your abdomen, nausea or vomiting, or fever, please return to the emergency department or follow up with your primary care physician immediately.

## 2015-06-12 NOTE — Patient Outreach (Signed)
1630 RNCM called pt to inquire about MD visit and how she was feeling. Pt stated she had not heard anything yet and was still feeling the same. Pt stated MD office obtained labs and did an x-ray but she had not heard the results. RNCM let pt know she would call MD office and see if she could obtain results.   1640: RNCM made a phone call to MD office and spoke with Morrie SheldonAshley. She stated she was going to send a note to nurse/MD to call pt with results.   When RNCM was able to get to computer to attempt to call pt back to see if MD office had been in touch RNCM saw in emr pt had been called back by MD office and sent to ED for f/u.  Plan: RNCM will send a note to hospital liaison to f/u with pt if admitted.   Costella HatcherJanci Elden Brucato RN, BSN  [facility] Care Management [phone])

## 2015-06-12 NOTE — ED Notes (Signed)
Pt presents after being called and told that she had a blood clot in her lungs by her PCP doctor. Pt c/o shortness of breath for "a long time". Pt states she was dx with pneumonia approx July of 2015 and has had SOB ever since. Pt is on chronic 2L of O2 at home. Pt presents with c/o pain to her back on her R shoulder blade.

## 2015-06-12 NOTE — Patient Outreach (Signed)
9am: Pt called RNCM and asked if there was any way RNCM could come by her home related to pt having having severe pain to her back. Pt stated she had been having this pain since Saturday morning. RNCM made pt aware she could come by and do pt VS but RNCM requested pt call her MD while RNCM was on her way to get an appointment. Pt verbalized agreement.  0930am: RNCM arrived at pt home. Pt bending over with pain. Guarding her right back and moaning with any movements. Pt VSS on 2lpm of 02 via [place]. Lungs with scattered wheezes and rhonchi. Abd with bowel sounds x 4 quads. No edema noted to bilateral lower extremeties. No obvious redness or deformity to area pt pointing to on back where pain located. Pt encouraged to do nebulizer treatment related to wheezes. Pt reported worsening SOB and increase in yellow thick sputum. Pt stated, "I went to bed on Friday night normal and when I woke up Saturday I was in this pain. It has gotten worse." Pt denied taking any pain medication, but stated she had taken her anti anxiety medication x2.  [facility], RNCM called pulmonologist per pt's request and was able to get pt an appointment at 2pm today. Pt and son verbalized understanding and stated they would go.   Plan: RNCM will check with pt this afternoon to see how she is feeling.  Costella HatcherJanci Nowell Sites RN, BSN  [facility] Care Management [phone])

## 2015-06-13 ENCOUNTER — Other Ambulatory Visit: Payer: Self-pay | Admitting: *Deleted

## 2015-06-13 NOTE — Patient Outreach (Signed)
RNCM contacted pt to inquire how she was feeling. Pt stated she had been taking pain medication and resting most of the day. RNCM inquired if pt had been able to pick up prescription for increased prednisone ordered by ED MD. Pt stated she was unaware of prescription. RNCM requested pt to look through her d/c papers at that time pt found prescription. Pt stated she would have her son pick it up tomorrow. Pt stated she had just taken some pain medication and was going back to bed.   Plan: RNCM has routine home visit scheduled with pt later this week.   Costella HatcherJanci Anni Hocevar RN, BSN  [facility] Care [facility] [phone])

## 2015-06-15 ENCOUNTER — Other Ambulatory Visit: Payer: Self-pay | Admitting: *Deleted

## 2015-06-15 NOTE — Patient Outreach (Signed)
[facility] Care Management   06/15/2015  Carla Harris 06/19/1929 [id number]  Carla Harris is an 81 y.o. female  Subjective: "My pain is some better as long as I take the oxy." "I just started the prednisone yesterday because I did not realize I was supposed to be on it."   Objective: Blood pressure 120/64, pulse 66, resp. rate 20, weight 100 lb (45.36 kg), last menstrual period 07/01/1969, SpO2 94 %.   Review of Systems  HENT: Positive for congestion.   Respiratory: Positive for sputum production.   Cardiovascular: Negative for leg swelling.  Musculoskeletal: Positive for back pain.    Physical Exam  Constitutional: She is oriented to person, place, and time.  thin  Cardiovascular: Normal rate and regular rhythm.   Pulses:      Radial pulses are 2+ on the right side, and 2+ on the left side.  Respiratory: She has wheezes. She has rales.  GI: Soft. Bowel sounds are normal. There is tenderness.  Musculoskeletal: She exhibits tenderness.  Neurological: She is alert and oriented to person, place, and time.  Skin: Skin is warm and dry. Bruising noted.       Current Medications:   Current Outpatient Prescriptions  Medication Sig Dispense Refill  . acetylcysteine (MUCOMYST) 20 % nebulizer solution Take 2 mLs by nebulization 2 (two) times daily.    Marland Kitchen. albuterol (PROVENTIL HFA;VENTOLIN HFA) 108 (90 BASE) MCG/ACT inhaler Inhale 1 puff into the lungs every 6 (six) hours as needed for wheezing or shortness of breath.    Marland Kitchen. albuterol (PROVENTIL) (2.5 MG/3ML) 0.083% nebulizer solution Inhale into the lungs.    Marland Kitchen. albuterol-ipratropium (COMBIVENT) 18-103 MCG/ACT inhaler Inhale 2 puffs into the lungs every 6 (six) hours as needed.    Marland Kitchen. azithromycin (ZITHROMAX) 250 MG tablet Take 500 mg by mouth 3 (three) times a week. Take 500mg  Mondays, Wednesdays, and Fridays for 30 days    . cetirizine (ZYRTEC) 10 MG tablet Take 10 mg by mouth daily.    . Cholecalciferol (VITAMIN D3)  2000 UNITS capsule Take 2,000 Units by mouth daily.    Marland Kitchen. esomeprazole (NEXIUM) 40 MG capsule Take 40 mg by mouth daily before breakfast. Take 1 capsule twice a day    . Fluticasone-Salmeterol (ADVAIR) 250-50 MCG/DOSE AEPB Inhale 1 puff into the lungs every 12 (twelve) hours.    . furosemide (LASIX) 20 MG tablet Take 20 mg by mouth every other day.    . hydrOXYzine (ATARAX/VISTARIL) 25 MG tablet Take 25 mg by mouth 3 (three) times daily as needed.    . metFORMIN (GLUCOPHAGE) 500 MG tablet Take 1 tablet (500 mg total) by mouth 2 (two) times daily with a meal. Two times a day with meal. (Patient taking differently: Take 500 mg by mouth daily with breakfast. Two times a day with meal.) 180 tablet 3  . montelukast (SINGULAIR) 10 MG tablet Take 10 mg by mouth at bedtime.    . Multiple Vitamin (MULTIVITAMIN) tablet Take 1 tablet by mouth daily.    Carla Harris. ONETOUCH DELICA LANCETS 33G MISC Test sugars three times daily 100 each 11  . predniSONE (DELTASONE) 20 MG tablet Take 1 tablet (20 mg total) by mouth daily. 30 tablet 0  . predniSONE (DELTASONE) 20 MG tablet Take 3 tablets (60 mg total) by mouth daily. 15 tablet 0  . rivaroxaban (XARELTO) 10 MG TABS tablet Take 15 mg by mouth daily.     . sotalol (BETAPACE) 80 MG tablet Take 160 mg  by mouth 2 (two) times daily.     . theophylline (THEODUR) 200 MG 12 hr tablet Take 200 mg by mouth 2 (two) times daily.     No current facility-administered medications for this visit.    Functional Status:   In your present state of health, do you have any difficulty performing the following activities: 03/16/2015 10/14/2014  Hearing? N N  Vision? N N  Difficulty concentrating or making decisions? N N  Walking or climbing stairs? Y Y  Dressing or bathing? N N  Doing errands, shopping? [name]  Preparing Food and eating ? N N  Using the Toilet? N N  In the past six months, have you accidently leaked urine? N N  Do you have problems with loss of bowel control? N N  Managing  your Medications? N Y  Managing your Finances? N N  Housekeeping or managing your Housekeeping? [name]    Fall/Depression Screening:    PHQ 2/9 Scores 05/16/2015 03/16/2015 10/14/2014 10/14/2014 07/27/2013 07/10/2012  PHQ - 2 Score 0  PHQ- 9 Score - 5 -    Assessment:  Pt alert and Ox3. RNCM reviewed medications and pt did now have prednisone in her home and was taking as ordered. Pt continues on antibiotics ordered by pulmonologist. Pt stating she has had to take 2 oxycontin per day to control pain, she is also using heat and rest for comfort. Noted to be guarding area to upper back when moving.  COPD: Pt's lungs with rales and wheezes scattered throughout. Denies increased mucus production stated "it's just the same". Pt actually seemed less SOB and anxious on pain medication. RNCM educated pt on the importance of continuing to move around, take deep breaths and drink adequate fluids to decrease pt risk of pneumonia. Pt veralized understanding.  HF: No LE edema noted. Pt denies nausea, or vomiting. Pt denies orthopnea or claudication. Denies chest pain.   Diabetes: Pt reports not checking sugar at all. RNCM encouraged pt to be careful about a low carb diet related to increased prednisone increases sugar. Pt voiced understanding. And was aware she was given insulin in the ED for high blood sugar.   Plan: RNCM will see pt in one month 07/18/2015  Nealy Hickmon RN, BSN  [facility] Care Management [phone])

## 2015-06-19 ENCOUNTER — Other Ambulatory Visit: Payer: Self-pay

## 2015-06-19 ENCOUNTER — Emergency Department
Admission: EM | Admit: 2015-06-19 | Discharge: 2015-06-19 | Disposition: A | Discharge status: Home / Self Care | Payer: Medicare Other | Attending: Emergency Medicine | Admitting: Emergency Medicine

## 2015-06-19 ENCOUNTER — Emergency Department: Payer: Medicare Other

## 2015-06-19 DIAGNOSIS — R0602 Shortness of breath: Secondary | ICD-10-CM | POA: Diagnosis present

## 2015-06-19 DIAGNOSIS — X58XXXD Exposure to other specified factors, subsequent encounter: Secondary | ICD-10-CM | POA: Diagnosis not present

## 2015-06-19 DIAGNOSIS — F17210 Nicotine dependence, cigarettes, uncomplicated: Secondary | ICD-10-CM | POA: Insufficient documentation

## 2015-06-19 DIAGNOSIS — Z880 Allergy status to penicillin: Secondary | ICD-10-CM | POA: Insufficient documentation

## 2015-06-19 DIAGNOSIS — S22000D Wedge compression fracture of unspecified thoracic vertebra, subsequent encounter for fracture with routine healing: Secondary | ICD-10-CM | POA: Diagnosis not present

## 2015-06-19 DIAGNOSIS — J441 Chronic obstructive pulmonary disease with (acute) exacerbation: Secondary | ICD-10-CM | POA: Insufficient documentation

## 2015-06-19 DIAGNOSIS — E119 Type 2 diabetes mellitus without complications: Secondary | ICD-10-CM | POA: Diagnosis not present

## 2015-06-19 LAB — COMPREHENSIVE METABOLIC PANEL
ALBUMIN: 3.4 g/dL — AB (ref 3.5–5.0)
ALK PHOS: 64 U/L (ref 38–126)
ALT: 7 U/L — AB (ref 14–54)
AST: 10 U/L — ABNORMAL LOW (ref 15–41)
Anion gap: 8 (ref 5–15)
BUN: 15 mg/dL (ref 6–20)
CALCIUM: 9.3 mg/dL (ref 8.9–10.3)
CO2: 26 mmol/L (ref 22–32)
CREATININE: 0.54 mg/dL (ref 0.44–1.00)
Chloride: 102 mmol/L (ref 101–111)
GFR calc non Af Amer: >60 mL/min (ref >60)
GLUCOSE: 180 mg/dL — AB (ref 65–99)
Potassium: 4.6 mmol/L (ref 3.5–5.1)
SODIUM: 136 mmol/L (ref 135–145)
Total Bilirubin: 0.3 mg/dL (ref 0.3–1.2)
Total Protein: 6.5 g/dL (ref 6.5–8.1)

## 2015-06-19 LAB — TROPONIN I: Troponin I: <0.03 ng/mL (ref <0.031)

## 2015-06-19 LAB — BRAIN NATRIURETIC PEPTIDE: B Natriuretic Peptide: 604 pg/mL — ABNORMAL HIGH (ref 0.0–100.0)

## 2015-06-19 MED ORDER — OXYCODONE-ACETAMINOPHEN 5-325 MG PO TABS: 2.0000 | ORAL_TABLET | Freq: Once | ORAL | Status: DC

## 2015-06-19 MED ORDER — MORPHINE SULFATE (PF) 2 MG/ML IV SOLN
2.0000 mg | Freq: Once | INTRAVENOUS | Status: AC
  Administered 2015-06-19: 2 mg via INTRAVENOUS
  Filled 2015-06-19: qty 1

## 2015-06-19 MED ORDER — IPRATROPIUM-ALBUTEROL 0.5-2.5 (3) MG/3ML IN SOLN: 3.0000 mL | Freq: Once | RESPIRATORY_TRACT | Status: AC

## 2015-06-19 MED ORDER — IPRATROPIUM-ALBUTEROL 0.5-2.5 (3) MG/3ML IN SOLN
9.0000 mL | Freq: Once | RESPIRATORY_TRACT | Status: AC
  Administered 2015-06-19: 9 mL via RESPIRATORY_TRACT

## 2015-06-19 MED ORDER — METHYLPREDNISOLONE SODIUM SUCC 125 MG IJ SOLR
125.0000 mg | Freq: Once | INTRAMUSCULAR | Status: AC
  Administered 2015-06-19: 125 mg via INTRAVENOUS
  Filled 2015-06-19: qty 2

## 2015-06-19 MED ORDER — OXYCODONE-ACETAMINOPHEN 7.5-325 MG PO TABS: 1.0000 | ORAL_TABLET | ORAL | Status: DC | PRN

## 2015-06-19 MED ORDER — IPRATROPIUM-ALBUTEROL 0.5-2.5 (3) MG/3ML IN SOLN
3.0000 mL | Freq: Once | RESPIRATORY_TRACT | Status: AC
  Administered 2015-06-19: 3 mL via RESPIRATORY_TRACT

## 2015-06-19 MED ORDER — HYDROCOD POLST-CPM POLST ER 10-8 MG/5ML PO SUER
5.0000 mL | Freq: Once | ORAL | Status: AC
  Administered 2015-06-19: 5 mL via ORAL
  Filled 2015-06-19: qty 5

## 2015-06-19 MED ORDER — IPRATROPIUM-ALBUTEROL 0.5-2.5 (3) MG/3ML IN SOLN
RESPIRATORY_TRACT | Status: AC
  Administered 2015-06-19: 9 mL via RESPIRATORY_TRACT
  Filled 2015-06-19: qty 9

## 2015-06-19 NOTE — ED Notes (Signed)
Patient recently in ER and treated for back pain. Today comes back in with increased shortness of breath and continued back pain.

## 2015-06-19 NOTE — Discharge Instructions (Signed)
Chronic Obstructive Pulmonary Disease Chronic obstructive pulmonary disease (COPD) is a common lung condition in which airflow from the lungs is limited. COPD is a general term that can be used to describe many different lung problems that limit airflow, including both chronic bronchitis and emphysema. If you have COPD, your lung function will probably never return to normal, but there are measures you can take to improve lung function and make yourself feel better. CAUSES   Smoking (common).  Exposure to secondhand smoke.  Genetic problems.  Chronic inflammatory lung diseases or recurrent infections. SYMPTOMS  Shortness of breath, especially with physical activity.  Deep, persistent (chronic) cough with a large amount of thick mucus.  Wheezing.  Rapid breaths (tachypnea).  Gray or bluish discoloration (cyanosis) of the skin, especially in your fingers, toes, or lips.  Fatigue.  Weight loss.  Frequent infections or episodes when breathing symptoms become much worse (exacerbations).  Chest tightness. DIAGNOSIS Your health care provider will take a medical history and perform a physical examination to diagnose COPD. Additional tests for COPD may include:  Lung (pulmonary) function tests.  Chest X-ray.  CT scan.  Blood tests. TREATMENT  Treatment for COPD may include:  Inhaler and nebulizer medicines. These help manage the symptoms of COPD and make your breathing more comfortable.  Supplemental oxygen. Supplemental oxygen is only helpful if you have a low oxygen level in your blood.  Exercise and physical activity. These are beneficial for nearly all people with COPD.  Lung surgery or transplant.  Nutrition therapy to gain weight, if you are underweight.  Pulmonary rehabilitation. This may involve working with a team of health care providers and specialists, such as respiratory, occupational, and physical therapists. HOME CARE INSTRUCTIONS  Take all medicines  (inhaled or pills) as directed by your health care provider.  Avoid over-the-counter medicines or cough syrups that dry up your airway (such as antihistamines) and slow down the elimination of secretions unless instructed otherwise by your health care provider.  If you are a smoker, the most important thing that you can do is stop smoking. Continuing to smoke will cause further lung damage and breathing trouble. Ask your health care provider for help with quitting smoking. He or she can direct you to community resources or hospitals that provide support.  Avoid exposure to irritants such as smoke, chemicals, and fumes that aggravate your breathing.  Use oxygen therapy and pulmonary rehabilitation if directed by your health care provider. If you require home oxygen therapy, ask your health care provider whether you should purchase a pulse oximeter to measure your oxygen level at home.  Avoid contact with individuals who have a contagious illness.  Avoid extreme temperature and humidity changes.  Eat healthy foods. Eating smaller, more frequent meals and resting before meals may help you maintain your strength.  Stay active, but balance activity with periods of rest. Exercise and physical activity will help you maintain your ability to do things you want to do.  Preventing infection and hospitalization is very important when you have COPD. Make sure to receive all the vaccines your health care provider recommends, especially the pneumococcal and influenza vaccines. Ask your health care provider whether you need a pneumonia vaccine.  Learn and use relaxation techniques to manage stress.  Learn and use controlled breathing techniques as directed by your health care provider. Controlled breathing techniques include:  Pursed lip breathing. Start by breathing in (inhaling) through your nose for 1 second. Then, purse your lips as if you were  going to whistle and breathe out (exhale) through the  pursed lips for 2 seconds.  Diaphragmatic breathing. Start by putting one hand on your abdomen just above your waist. Inhale slowly through your nose. The hand on your abdomen should move out. Then purse your lips and exhale slowly. You should be able to feel the hand on your abdomen moving in as you exhale.  Learn and use controlled coughing to clear mucus from your lungs. Controlled coughing is a series of short, progressive coughs. The steps of controlled coughing are: 1. Lean your head slightly forward. 2. Breathe in deeply using diaphragmatic breathing. 3. Try to hold your breath for 3 seconds. 4. Keep your mouth slightly open while coughing twice. 5. Spit any mucus out into a tissue. 6. Rest and repeat the steps once or twice as needed. SEEK MEDICAL CARE IF:  You are coughing up more mucus than usual.  There is a change in the color or thickness of your mucus.  Your breathing is more labored than usual.  Your breathing is faster than usual. SEEK IMMEDIATE MEDICAL CARE IF:  You have shortness of breath while you are resting.  You have shortness of breath that prevents you from:  Being able to talk.  Performing your usual physical activities.  You have chest pain lasting longer than 5 minutes.  Your skin color is more cyanotic than usual.  You measure low oxygen saturations for longer than 5 minutes with a pulse oximeter. MAKE SURE YOU:  Understand these instructions.  Will watch your condition.  Will get help right away if you are not doing well or get worse.   This information is not intended to replace advice given to you by your health care provider. Make sure you discuss any questions you have with your health care provider.   Document Released: 04/10/2005 Document Revised: 07/22/2014 Document Reviewed: 02/25/2013 Elsevier Interactive Patient Education 2016 [employer] Vertebral Fracture A vertebral fracture is a break in one of the bones that make up the  spine (vertebrae). The vertebrae are stacked on top of each other to form the spinal column. They support the body and protect the spinal cord. The vertebral column has an upper part (cervical spine), a middle part (thoracic spine), and a lower part (lumbar spine). Most vertebral fractures occur in the thoracic spine or lumbar spine. There are three main types of vertebral fractures:  Flexion fracture. This happens when vertebrae collapse. Vertebrae can collapse:  In the front (compression fracture). This type of fracture is common in people who have a condition that causes their bones to be weak and brittle (osteoporosis). The fracture can make a person lose height.  In the front and back (axial burst fracture).  Extension fracture. This happens when an external force pulls apart the vertebrae.  Rotation fracture. This happens when the spine bends extremely in one direction. This type can cause a piece of a vertebra to break off (transverse process fracture) or move out of its normal position (fracture dislocation). This type of fracture has a high risk for spinal cord injury. Vertebral fractures can range from mild to very severe. The most severe types are those that cause the broken bones to move out of place (unstable) and those that injure or press on the spinal cord. CAUSES This condition is usually caused by a forceful injury. This type of injury commonly results from:  Car accidents.  Falling or jumping from a great height.  Collisions in contact sports.  Violent acts, such as an assault or a gunshot wound. RISK FACTORS This injury is more likely to happen to people who:  Have osteoporosis.  Participate in contact sports.  Are in situations that could result in falls or other violent injuries. SYMPTOMS Symptoms of this injury depend on the location and the type of fracture. The most common symptom is back pain that gets worse with movement. You may also have trouble standing  or walking. If a fracture has damaged your spinal cord or is pressing on it, you may also have:  Numbness.  Tingling.  Weakness.  Loss of movement.  Loss of bowel or bladder control. DIAGNOSIS This injury may be diagnosed based on symptoms, medical history, and a physical exam. You may also have imaging tests to confirm the diagnosis. These may include:  Spine X-ray.  CT scan.  MRI. TREATMENT Treatment for this injury depends on the type of fracture. If your fracture is stable and does not affect your spinal cord, it may heal with nonsurgical treatment, such as: 7. Taking pain medicine. 8. Wearing a cast or a brace. 9. Doing physical therapy exercises. If your vertebral fracture is unstable or it affects your spinal cord, you may need surgical treatment, such as:  Laminectomy. This procedure involves removing the part of a vertebra that is pushing on the spinal cord (spinal decompression surgery). Bone fragments may also be removed.  Spinal fusion. This procedure is used to stabilize an unstable fracture. Vertebrae may be joined together with a piece of bone from another part of your body (graft) and held in place with rods, plates, or screws.  Vertebroplasty. In this procedure, bone cement is used to rebuild collapsed vertebrae. HOME CARE INSTRUCTIONS General Instructions  Take medicines only as directed by your health care provider.  Do not drive or operate heavy machinery while taking pain medicine.  If directed, apply ice to the injured area:  Put ice in a plastic bag.  Place a towel between your skin and the bag.  Leave the ice on for 30 minutes every two hours at first. Then apply the ice as needed.  Wear your neck brace or back brace as directed by your health care provider.  Do not drink alcohol. Alcohol can interfere with your treatment.  Keep all follow-up visits as directed by your health care provider. This is important. It can help to prevent permanent  injury, disability, and long-lasting (chronic) pain. Activity  Stay in bed (on bed rest) only as directed by your health care provider. Being on bed rest for too long can make your condition worse.  Return to your normal activities as directed by your health care provider. Ask what activities are safe for you.  Do exercises to improve motion and strength in your back (physical therapy), as recommended by your health care provider.   Exercise regularly as directed by your health care provider. SEEK MEDICAL CARE IF:  You have a fever.  You develop a cough that makes your pain worse.  Your pain medicine is not helping.  Your pain does not get better over time.  You cannot return to your normal activities as planned or expected. SEEK IMMEDIATE MEDICAL CARE IF:  Your pain is very bad and it suddenly gets worse.  You are unable to move any body part (paralysis) that is below the level of your injury.  You have numbness, tingling, or weakness in any body part that is below the level of your injury.  You  cannot control your bladder or bowels.   This information is not intended to replace advice given to you by your health care provider. Make sure you discuss any questions you have with your health care provider.   Document Released: 08/08/2004 Document Revised: 11/15/2014 Document Reviewed: 07/06/2014 Elsevier Interactive Patient Education [employer]2016 Elsevier Inc.

## 2015-06-19 NOTE — ED Provider Notes (Signed)
[facility] Emergency Department Provider Note     Time seen: ----------------------------------------- 11:02 AM on 06/19/2015 -----------------------------------------    I have reviewed the triage vital signs and the nursing notes.   HISTORY  Chief Complaint Chest Pain    HPI Carla Harris is a 81 y.o. female who presents ER for shortness of breath and continued back pain. Patient recently was seen in the ER and treated for back pain. According to reports she had an outpatient MRI, unclear of the results. She claims difficulty breathing having artery taken her breathing treatments today. She denies fevers chills or other complaints.   Past Medical History  Diagnosis Date  . Anemia   . Diabetes mellitus without complication (HCC)   . Bronchiectasis (HCC)   . Colon polyps   . Hypercholesteremia   . S/P AAA repair   . Right rotator cuff tear   . COPD (chronic obstructive pulmonary disease) (HCC)   . Low serum IgG1 and IgM levels   . Oxygen deficiency   . Cataract     resolved with surgery    Patient Active Problem List   Diagnosis Date Noted  . Weight loss 03/19/2015  . Scalp lesion 03/19/2015  . CAD (coronary artery disease) 11/30/2014  . Dizziness 07/01/2014  . Fatigue 07/01/2014  . Hypoxia 04/24/2014  . Adnexal mass 04/24/2014  . Tobacco abuse 02/07/2014  . Acute and chronic respiratory failure 01/13/2014  . GERD (gastroesophageal reflux disease) 01/13/2014  . Atrial fibrillation (HCC) 09/25/2013  . Edema 03/14/2013  . COPD (chronic obstructive pulmonary disease) (HCC) 07/01/2012  . Bronchiectasis (HCC) 07/01/2012  . Hypercholesterolemia 07/01/2012  . Anemia 07/01/2012  . History of colon polyps 07/01/2012  . Diabetes mellitus (HCC) 07/01/2012  . Osteoporosis 07/01/2012    Past Surgical History  Procedure Laterality Date  . Appendectomy    . Abdominal hysterectomy    . Abdominal aortic aneurysm repair    . Rotator cuff  repair      Allergies Advil; Ciprofloxacin; Clindamycin/lincomycin; Levaquin; Penicillins; Septra; Sulfa antibiotics; and Doxycycline  Social History Social History  Substance Use Topics  . Smoking status: Current Every Day Smoker    Types: Cigarettes  . Smokeless tobacco: Never Used     Comment: Has decreased to about 5 a day  . Alcohol Use: No    Review of Systems Constitutional: Negative for fever. Eyes: Negative for visual changes. ENT: Negative for sore throat. Cardiovascular: Negative for chest pain. Respiratory: Positive for shortness of breath Gastrointestinal: Negative for abdominal pain, vomiting and diarrhea. Genitourinary: Negative for dysuria. Musculoskeletal: Positive for back pain Skin: Negative for rash. Neurological: Negative for headaches, focal weakness or numbness.  10-point ROS otherwise negative.  ____________________________________________   PHYSICAL EXAM:  VITAL SIGNS: ED Triage Vitals  Enc Vitals Group     BP 06/19/15 1032 109/51 mmHg     Pulse Rate 06/19/15 1032 66     Resp 06/19/15 1032 22     Temp 06/19/15 1032 97.4 F (36.3 C)     Temp Source 06/19/15 1032 Oral     SpO2 06/19/15 1032 94 %     Weight 06/19/15 1032 100 lb (45.36 kg)     Height 06/19/15 1032 5' (1.524 m)     Head Cir --      Peak Flow --      Pain Score 06/19/15 1033 8     Pain Loc --      Pain Edu? --  Excl. in GC? --     Constitutional: Alert and oriented. Ill-appearing, mild distress Eyes: Conjunctivae are normal. PERRL. Normal extraocular movements. ENT   Head: Normocephalic and atraumatic.   Nose: No congestion/rhinnorhea.   Mouth/Throat: Mucous membranes are moist.   Neck: No stridor. Cardiovascular: Normal rate, regular rhythm. Normal and symmetric distal pulses are present in all extremities. No murmurs, rubs, or gallops. Respiratory: Tachypnea with severe rhonchorous breath sounds bilaterally. Gastrointestinal: Soft and nontender. No  distention. No abdominal bruits.  Musculoskeletal: Nontender with normal range of motion in all extremities. No joint effusions.  No lower extremity tenderness nor edema. Neurologic:  Normal speech and language. No gross focal neurologic deficits are appreciated. Speech is normal. No gait instability. Skin:  Skin is warm, dry and intact. No rash noted. Psychiatric: Mood and affect are normal. Speech and behavior are normal. Patient exhibits appropriate insight and judgment. ____________________________________________  EKG: Interpreted by me. Normal sinus rhythm with normal axis normal intervals. No evidence of hypertrophy or acute infarction. Rate 65 bpm  ____________________________________________  ED COURSE:  Pertinent labs & imaging results that were available during my care of the patient were reviewed by me and considered in my medical decision making (see chart for details). Patient with apparent COPD exacerbation, we'll give morphine for back pain and DuoNeb and steroids for her COPD. ____________________________________________    LABS (pertinent positives/negatives)  Labs Reviewed  COMPREHENSIVE METABOLIC PANEL - Abnormal; Notable for the following:    Glucose, Bld 180 (*)    Albumin 3.4 (*)    AST 10 (*)    ALT 7 (*)    All other components within normal limits  TROPONIN I  CBC WITH DIFFERENTIAL/PLATELET  BRAIN NATRIURETIC PEPTIDE    RADIOLOGY  Chest x-ray IMPRESSION: COPD.  Bibasilar atelectasis or infiltrates, right greater than left. Cannot exclude pneumonia in the right lung base. ____________________________________________  FINAL ASSESSMENT AND PLAN  COPD exacerbation, back pain secondary to thoracic compression fractures  Plan: Patient with labs and imaging as dictated above. Patient sounds remarkably better after stacked nebs and steroids. She will continue same as an outpatient. Her pain today is mainly in her back, this seems to be arthritis or  musculoskeletal in origin from COPD and coughing. CT week ago does not show any PE but showed thoracic compression fractures which is likely cause of her pain.   Emily FilbertWilliams, Willia Genrich E, MD   Emily FilbertJonathan E Ramani Riva, MD 06/19/15 [phone]

## 2015-06-20 ENCOUNTER — Other Ambulatory Visit: Payer: Self-pay | Admitting: *Deleted

## 2015-06-20 NOTE — Patient Outreach (Signed)
RNCM called pt to assess how pt was doing after seeing pt had been in the ED yesterday. Pt said her breathing was improved but her pain was the same. Pt stated she was told in the ED she had fractures in her back that were seen on the CT scan performed last week. Pt stated she was supposed to see a doctor but was not sure who. RNCM let pt know she was to f/u with her primary care MD and with orthopedic doctor Deeann SaintHoward Miller MD according to the d/c instructions from the ED. Pt was moaning loudly with every movement and stated she had not ever had to take this much pain medication. RNCM offered to make orthopedic appt for her and pt stated it would be helpful to her. RNCM called ortho office and made pt appt with Dr. Hyacinth MeekerMiller for Thursday at 2:45. This was his first available appointment. RNCM made pt aware of appointment and made sure she was clear on directions and gave her the office number in case she needed to make a change. Discussed with pt the importance of being careful while on pain medications not to fall. Requested pt allow her son to assist her with adls related to the s/e of pain medication. Pt verbalized she would be careful and was aware of the dangers.   Plan: RNCM will check on pt after her MD visit to find out the plan.   Costella HatcherJanci Likisha Alles RN, BSN  [facility] [phone])

## 2015-06-21 ENCOUNTER — Ambulatory Visit: Payer: Self-pay | Admitting: Internal Medicine

## 2015-06-27 ENCOUNTER — Other Ambulatory Visit: Payer: Self-pay | Admitting: *Deleted

## 2015-06-27 NOTE — Patient Outreach (Signed)
RNCM called pt to see how visit with orthopedic MD went. Pt stated he was unable to offer her any help except pain medications. Pt stated he offered her a back brace but stated it would cost greater than 400$ and pt stated she could not pay that. Pt stating her pain has not improved but is tolerable with the pain medications. Pt stated she was discouraged related to she usually has her family over for Christmas Eve but she will not be able to do this related to her pain. RNCM suggested pt talk with her primary care MD to inform her of the continued pain. Pt stated she wasn't sure what to do. Pt wondered why she was not a candidate for the "cement" that can be placed in her back to help with pain, but she said the doctor didn't tell her. RNCM asked of she thought she should get a second opinion and pt stated she didn't know what to do.   Plan: RNCM will make PCP aware of pt's continued back pain.  Costella HatcherJanci Darvis Croft RN, BSN  [facility] [phone])

## 2015-07-06 ENCOUNTER — Other Ambulatory Visit: Payer: Self-pay | Admitting: *Deleted

## 2015-07-06 NOTE — Patient Outreach (Signed)
RNCM called pt to see how her back was feeling. Pt stated she has not had much improvement. Pt stated she saw her pulmonologist today and he prescribed some more pain medication. Pt stating she is only taking them if she has to and is taking one on most days but two on bad days. Pt stated she was not going to be able to have Christmas at her home this year which was causing her to feel discouraged but she stated she just felt too tired. RNCM discussed with pt the option of seeing a pain specialist by the name of Dr. Yves Dillhasnis,  per Dr. Roby LoftsScott's suggestion. Pt was not sure what she wanted to do yet, and stated the orthopedic doctor had told her if she had not improved in a week to contact him for follow up. Pt stated she did not know whether she should f/u with him or not related to he told her at previous visit there was nothing he could do. Pt stating she was just tired and was unsure what to do about the pain. Pt was receiving another phone call and ask RNCM if she could talk to her later related to incoming call. Phone call was ended with no real decision made about how the pt wanted to proceed.   Plan: RNCM will contact pt next week to see if she has had any improvement.  Costella HatcherJanci Graycee Greeson RN, BSN  [facility] [phone])

## 2015-07-18 ENCOUNTER — Other Ambulatory Visit: Payer: Self-pay | Admitting: *Deleted

## 2015-07-18 ENCOUNTER — Encounter: Payer: Self-pay | Admitting: *Deleted

## 2015-07-18 NOTE — Patient Outreach (Signed)
[facility]) Care Management   07/18/2015  Carla Harris 05/10/1929 [id number]  Carla Harris is an 82 y.o. female  Subjective: "I haven't had my oxygen concentrator serviced in a long time." "I cancelled my appointment with Dr. Nicki Reaper because of my pain. I know I need to go see her." "I am feeling discouraged and hopeless, but I don't want any more pills."  Objective: Blood pressure 120/60, pulse 77, resp. rate 20, height 1.575 m (_0 ), weight 95 lb (43.092 kg), last menstrual period 07/01/1969, SpO2 94 %. Review of Systems  Constitutional: Positive for weight loss.  HENT: Positive for congestion.   Respiratory: Positive for cough, sputum production and shortness of breath.   Musculoskeletal: Positive for back pain.  Neurological: Positive for weakness.  Psychiatric/Behavioral: Positive for depression.    Physical Exam  Constitutional: She is oriented to person, place, and time. She appears well-developed. She appears cachectic.  Cardiovascular: Normal rate, regular rhythm and normal heart sounds.   Pulses:      Radial pulses are 2+ on the right side, and 2+ on the left side.       Popliteal pulses are 2+ on the right side, and 2+ on the left side.       Dorsalis pedis pulses are 2+ on the right side, and 2+ on the left side.  Respiratory: She has wheezes.  SOB noted with exertion  GI: Soft. Bowel sounds are normal.  Musculoskeletal: She exhibits tenderness.       Thoracic back: She exhibits pain.  Neurological: She is alert and oriented to person, place, and time.  Skin: Skin is warm, dry and intact.  Psychiatric: Her speech is normal and behavior is normal. Judgment and thought content normal. Cognition and memory are normal. She exhibits a depressed mood.    Current Medications:   Current Outpatient Prescriptions  Medication Sig Dispense Refill  . acetylcysteine (MUCOMYST) 20 % nebulizer solution Take 2 mLs by nebulization 2 (two) times daily.    Marland Kitchen  albuterol (PROVENTIL) (2.5 MG/3ML) 0.083% nebulizer solution Take 2.5 mg by nebulization 4 (four) times daily.     Marland Kitchen aspirin EC 81 MG tablet Take 81 mg by mouth daily.    . cetirizine (ZYRTEC) 10 MG tablet Take 10 mg by mouth daily.    . Cholecalciferol (VITAMIN D3) 2000 UNITS capsule Take 2,000 Units by mouth daily.    Marland Kitchen esomeprazole (NEXIUM) 40 MG capsule Take 40 mg by mouth daily before breakfast. Take 1 capsule twice a day    . Fluticasone-Salmeterol (ADVAIR) 250-50 MCG/DOSE AEPB Inhale 1 puff into the lungs every 12 (twelve) hours.    . furosemide (LASIX) 20 MG tablet Take 20 mg by mouth every other day.    . Ipratropium-Albuterol (COMBIVENT RESPIMAT) 20-100 MCG/ACT AERS respimat Inhale 1 puff into the lungs 4 (four) times daily.    . metFORMIN (GLUCOPHAGE) 500 MG tablet Take 1 tablet (500 mg total) by mouth 2 (two) times daily with a meal. Two times a day with meal. (Patient taking differently: Take 500 mg by mouth 2 (two) times daily with a meal. ) 180 tablet 3  . montelukast (SINGULAIR) 10 MG tablet Take 10 mg by mouth daily.     . Multiple Vitamin (MULTIVITAMIN) tablet Take 1 tablet by mouth daily.    Glory Rosebush DELICA LANCETS 19J MISC Test sugars three times daily 100 each 11  . oxyCODONE-acetaminophen (PERCOCET) 7.5-325 MG tablet Take 1 tablet by mouth every 4 (four)  hours as needed for severe pain. 20 tablet 0  . predniSONE (DELTASONE) 20 MG tablet Take 1 tablet (20 mg total) by mouth daily. 30 tablet 0  . rivaroxaban (XARELTO) 10 MG TABS tablet Take 10 mg by mouth daily.    . sotalol (BETAPACE) 80 MG tablet Take 160 mg by mouth 2 (two) times daily.     . theophylline (THEODUR) 200 MG 12 hr tablet Take 200 mg by mouth 2 (two) times daily.    Marland Kitchen azithromycin (ZITHROMAX) 250 MG tablet Take 500 mg by mouth 3 (three) times a week. Reported on 07/18/2015    . predniSONE (DELTASONE) 20 MG tablet Take 3 tablets (60 mg total) by mouth daily. (Patient not taking: Reported on 07/18/2015) 15 tablet 0    No current facility-administered medications for this visit.    Functional Status:   In your present state of health, do you have any difficulty performing the following activities: 07/18/2015 03/16/2015  Hearing? - N  Vision? - N  Difficulty concentrating or making decisions? - N  Walking or climbing stairs? - Y  Dressing or bathing? - N  Doing errands, shopping? - Y  Preparing Food and eating ? N N  Using the Toilet? N N  In the past six months, have you accidently leaked urine? N N  Do you have problems with loss of bowel control? N N  Managing your Medications? N N  Managing your Finances? N N  Housekeeping or managing your Housekeeping? Tempie Donning    Fall/Depression Screening:    PHQ 2/9 Scores 07/18/2015 05/16/2015 03/16/2015 10/14/2014 10/14/2014 07/27/2013 07/10/2012  PHQ - 2 Score _0 0  PHQ- 9 Score _1 - 5 -   Fall Risk  07/18/2015 05/16/2015 04/17/2015 03/16/2015 10/14/2014  Falls in the past year? _2   Number falls in past yr: 2 or more 2 or more 2 or more 1 1  Injury with Fall? Yes Yes Yes No No  Risk Factor Category  _3   Risk for fall due to : History of fall(s);Medication side effect Impaired balance/gait;Medication side effect History of fall(s);Impaired balance/gait;Medication side effect History of fall(s);Impaired balance/gait;Medication side effect Impaired balance/gait;Medication side effect  Follow up Education provided;Falls prevention discussed;Follow up appointment Education provided;Falls prevention discussed;Follow up appointment Falls evaluation completed;Education provided;Falls prevention discussed Education provided;Falls prevention discussed Falls prevention discussed    Assessment: RNCM arrived and pt was noted to be out of breath just answering the door. Pt noted to look frail and thin, and stated she had been feeling hopeless. Pt talked of having a lawyer coming to her  home tomorrow to get her will in order for her children. Pt was teary at times during the conversation. Talked with pt about hospice/palliative care and pt stated she was not ready. RNCM and pt had a frank conversation about her letting RNCM know when she is ready to have hospice. Pt verbalized understanding. Pt talking about her past right much during the visit.  COPD: Pt noted to be SOB with exertion. Continuing to use flutter valve. Lungs with course breath sounds and scattered wheezes throughout. Pt reports coughing up sputum but no different in color. RNCM assisted pt in changing the tubing to her concentrator, portable 02 and emergency 02. Pt continued to smoke. RNCM called [facility] and talked with Bridgett requesting pt 02  supplies and the steps necessary for pt to obtain a lighter more manageable portable 02 concentrator related to pt's weight loss and back pain.   Back Pain: Pt continues to c/o severe back pain with movement. Pt does say pain is slightly improved unless she moves a certain way then pain becomes excruciating again. She relays that the ordered pain medication is helpful, but she does not feeling so "doped up" all the time. Pt alternating ice and heat, also is wearing a wrap/brace around the injury site. RNCM called Dr. Bary Leriche office and spoke with [name], who made pt an appt to f/u on back pain. No visible bruising or swelling to area.   Diabetes: Pt has not been checking blood sugars. Bilateral feet with skin intact. +2 pedal pulses feet cool the touch bilaterally. Denies s/s of hypo/hyperglycemic episodes.   Heart failure: Pt taking lasix qod. Pt with loss of appetite, denies nausea, orthopnea or claudication. Pt with slight puffiness to bilateral ankles but no other edema noted. Continues on HF medications as ordered.     Plan:  RNCM will make MD aware of pt's need of a smaller lighter 02 concentrator.  RNCM will see pt in one month.   Abree Romick RN, BSN  [facility] Care Management [phone])  THN CM Care Plan Problem One        Most Recent Value   Care Plan Problem One  Pt has had back pain from several compression spinal fractures   Role Documenting the Problem One  Care Management [place] for Problem One  Active   THN Long Term Goal (31-90 days)  Pt wil not be admitted to the hospital with an exacerbation of COPD in the next 90 days.   THN Long Term Goal Start Date  01/31/15   THN Long Term Goal Met Date  03/16/15   Interventions for Problem One Long Term Goal  Reviewed COPD action plan with pt.   THN CM Short Term Goal #1 (0-30 days)  Pt will attend a primary care visit to discuss further her back pain in the next 30 days.   THN CM Short Term Goal #1 Start Date  07/18/15   [facility] CM Short Term Goal #1 Met Date  03/16/15   Interventions for Short Term Goal #1  RNCM discussed with pt her ongoing back pain and primary care MD's role in pt's care. RNCM assissted pt in making an appointment to see PCP.   THN CM Short Term Goal #2 (0-30 days)  Pt will obtain a lighter portable 02 concentrator to assist in pt's mobility in the next 30 days.   THN CM Short Term Goal #2 Start Date  07/18/15   [facility] CM Short Term Goal #2 Met Date  03/16/15   Interventions for Short Term Goal #2  RNCM called [place] pt's [place] and requested the steps needed for pt to obtain a lighter 02 tank.    THN CM Short Term Goal #3 (0-30 days)  Pt will attend endocrinology appointment with in the next 30 days to obtain a new glucometer and diabetic plan   THN CM Short Term Goal #3 Start Date  11/14/14   [facility] CM Short Term Goal #3 Met Date  12/14/14   Interventions for Short Tern Goal #3  previous appointment cancelled but rescheduled by [facility] at request of pt. Reinforced the importance of having sugars regulated and obtaining a new meter.   THN CM Short  Term Goal #4 (0-30 days)  Pt will exercise 3x per week for the next 30 days to  help increase strength   THN CM Short Term Goal #4 Start Date  11/14/14   [facility] CM Short Term Goal #4 Met Date  12/14/14   Interventions for Short Term Goal #4  Discussed the importance of exercise in treating mild depression. Education sheet provided on depression stressing the importance of exercise. Also discussed the importance of exercise in the endurance process.    THN CM Short Term Goal #5 (0-30 days)  Pt will follow instructions given by Endocrinologist in hopes to come off insulin in the next 30days   THN CM Short Term Goal #5 Start Date  12/14/14   [facility] CM Short Term Goal #5 Met Date  01/31/15   Interventions for Short Term Goal #5  RNCM attended Edndocrinology appt with pt. RNCM will make a detailed calendar listing when fingersticks need to be obtained. Pt will report to MD a pattern of sugars greater than 250 for 3 consecutive days.    THN CM Care Plan Problem Two        Most Recent Value   Care Plan Problem Two  Decreased energy and not feeling like doing any of the things pt previously enjoyed such as crocheting or reading mystery novels.   Role Documenting the Problem Two  Care Management Coordinator   Care Plan for Problem Two  Not Active   Interventions for Problem Two Long Term Goal   Discussed with pt s/s of depression. Educated pt on the difference between an antidepressant and her anti anxiety medication. Educated pt on the importance of exercise in dealing with depression.   THN Long Term Goal (31-90) days  In the next 90 days will crochet a baby blanket for a friend.   THN Long Term Goal Start Date  03/16/15   THN Long Term Goal Met Date  -- [Goal not met, pt stated changed her mind]   THN CM Short Term Goal #1 (0-30 days)  Pt will walk in her yard to the back fence and back on day it is less than 80 degrees for the next 30 days.    THN CM Short Term Goal #1 Start Date  03/16/15   [facility] CM Short Term Goal #1 Met Date   04/17/15   Interventions for Short Term Goal #2   RNCM  discussed with pt the benefits of exercise in HF, COPD, and depression.   THN CM Short Term Goal #2 (0-30 days)  Pt will have questions answered about her weight loss and weakness by her primary care doctor in the next 30 days   THN CM Short Term Goal #2 Start Date  03/16/15   [facility] CM Short Term Goal #2 Met Date  04/17/15   Interventions for Short Term Goal #2  RNCM discussed with pt the questions she had for her PCP at upcoming visit. RNCM wrote out questions for pt to take with her so she would not forget what she wanted to ask.    THN CM Short Term Goal #3 (0-30 days)  Pt will increase daily caloric intake to combat weight loss in the next 30 days.   THN CM Short Term Goal #3 Start Date  04/17/15   [facility] CM Short Term Goal #3 Met Date  05/16/15   Interventions for Short Term Goal #3  RNCM and pt discussed ways to boost calories such as adding cheese to her potatoes,  butter, peanut butter and possibly a nutritional supplement to increase calories.   THN CM Short Term Goal #4 (0-30 days)  Pt will have no signs and symptoms of infection to skin tears from fall in the next 30 days.   THN CM Short Term Goal #4 Start Date  04/17/15   [facility]N CM Short Term Goal #4 Met Date  05/16/15   Interventions of Short Term Goal #4  RNCM contacted MD to relay pt fall and request guidance on dressing skin tears. RNCM educated pt and son the s/s of  skin infection and when to call the MD. [facility] provided pt with clean dressing supplies and demonstrated how pt should cleanse and change dressing.    THN CM Care Plan Problem Three        Most Recent Value   Care Plan Problem Three  Pt using antibiotic for to treat long term lung infetion.   Role Documenting the Problem Three  Care Management Coordinator   Care Plan for Problem Three  Active   THN Long Term Goal (31-90) days  Pt will not have to stay inpatient for pneumonia in the next 90 days.   THN Long Term Goal Start Date  06/15/15   Interventions for Problem Three Long  Term Goal  RNCM educated pt on the risk of pneumonia when taking narcotics, and being more sedentary related to pain.   THN CM Short Term Goal #1 (0-30 days)  Pt will complete her course of antibiotics in the next 30 days without adverse side effects.   THN CM Short Term Goal #1 Start Date  05/16/15   [facility] CM Short Term Goal #1 Met Date  06/15/15 [pt is now on maintenence abt therapy]   Interventions for Short Term Goal #1  Educated on side effects of medicattion,    THN CM Short Term Goal #2 (0-30 days)  Pt will use flutter valve once per day for the next 30 days.    THN CM Short Term Goal #2 Start Date  05/16/15   [facility] CM Short Term Goal #2 Met Date  06/15/15   Interventions for Short Term Goal #2  RNCM watched video on proper use of flutter valve. Discussed the importance of coughing up secretions    THN CM Short Term Goal #3 (0-30 days)  Pt will complete ordered prednisone course in the next 30 days   THN  CM Short Term Goal #3 Start Date  06/15/15   [facility] CM Short Term Goal #3 Met Date  07/18/15   Interventions for Short Term Goal #3  RNCM assisted pt in understanding ED instructions and making her aware she was to increase prednisone. RNCM made home visit to review medications to ensure proper understanding and that medications were present in the home.    THN CM Short Term Goal #4 (0-30 days)  Pt will be free of s/s of pneumonia for the next 30 days.   THN CM Short Term Goal #4 Start Date  06/15/15   [facility] CM Short Term Goal #4 Met Date  07/18/15   Interventions for Short Term Goal #4  RNCM educated pt on s/s of pneumonia. RNCM educated pt on the importance of continuing to move, drink fluids and take medications as prescribed to reduce the risk of pneumonia.

## 2015-07-24 ENCOUNTER — Ambulatory Visit: Payer: Self-pay | Admitting: Internal Medicine

## 2015-07-25 ENCOUNTER — Telehealth: Payer: Self-pay | Admitting: Internal Medicine

## 2015-07-25 ENCOUNTER — Emergency Department
Admission: EM | Admit: 2015-07-25 | Discharge: 2015-07-25 | Disposition: A | Discharge status: Home / Self Care | Payer: Medicare Other | Attending: Emergency Medicine | Admitting: Emergency Medicine

## 2015-07-25 ENCOUNTER — Encounter: Payer: Self-pay | Admitting: Emergency Medicine

## 2015-07-25 ENCOUNTER — Other Ambulatory Visit: Payer: Self-pay

## 2015-07-25 ENCOUNTER — Emergency Department: Payer: Medicare Other

## 2015-07-25 DIAGNOSIS — J439 Emphysema, unspecified: Secondary | ICD-10-CM | POA: Diagnosis not present

## 2015-07-25 DIAGNOSIS — M549 Dorsalgia, unspecified: Secondary | ICD-10-CM | POA: Insufficient documentation

## 2015-07-25 DIAGNOSIS — Z7952 Long term (current) use of systemic steroids: Secondary | ICD-10-CM | POA: Insufficient documentation

## 2015-07-25 DIAGNOSIS — F17210 Nicotine dependence, cigarettes, uncomplicated: Secondary | ICD-10-CM | POA: Insufficient documentation

## 2015-07-25 DIAGNOSIS — G8929 Other chronic pain: Secondary | ICD-10-CM | POA: Insufficient documentation

## 2015-07-25 DIAGNOSIS — Z7982 Long term (current) use of aspirin: Secondary | ICD-10-CM | POA: Diagnosis not present

## 2015-07-25 DIAGNOSIS — Z880 Allergy status to penicillin: Secondary | ICD-10-CM | POA: Insufficient documentation

## 2015-07-25 DIAGNOSIS — Z79899 Other long term (current) drug therapy: Secondary | ICD-10-CM | POA: Diagnosis not present

## 2015-07-25 DIAGNOSIS — E119 Type 2 diabetes mellitus without complications: Secondary | ICD-10-CM | POA: Diagnosis not present

## 2015-07-25 MED ORDER — OXYCODONE-ACETAMINOPHEN 5-325 MG PO TABS
1.0000 | ORAL_TABLET | Freq: Once | ORAL | Status: AC
  Administered 2015-07-25: 1 via ORAL
  Filled 2015-07-25: qty 1

## 2015-07-25 MED ORDER — IPRATROPIUM-ALBUTEROL 0.5-2.5 (3) MG/3ML IN SOLN
3.0000 mL | Freq: Once | RESPIRATORY_TRACT | Status: AC
  Administered 2015-07-25: 3 mL via RESPIRATORY_TRACT
  Filled 2015-07-25: qty 3

## 2015-07-25 MED ORDER — FENTANYL 50 MCG/HR TD PT72: 50.0000 ug | MEDICATED_PATCH | TRANSDERMAL | Status: DC

## 2015-07-25 NOTE — ED Notes (Signed)
Pt arrived by EMS with c/o of SOB after using her nebulizer 2 times and her rescue inhaler. Pt called her PCP at [facility] and was told by the nurse to call 911. Pt has wheezing and audible congestion. Pt states she has a productive cough w/ hx of COPD. Pt uses O2 off/on at home as needed for SOB. Currently O2 stat at 96% on room air.

## 2015-07-25 NOTE — ED Provider Notes (Signed)
[facility] Emergency Department Provider Note  ____________________________________________  Time seen: Approximately 4:59 PM  I have reviewed the triage vital signs and the nursing notes.   HISTORY  Chief Complaint Shortness of Breath    HPI Carla Harris is a 82 y.o. female with end-stage COPD on 2 L nasal cannula at home,status post AAA repair, compression fracture of the spine, DM, presenting with back pain and shortness of breath.  Patient reports that she has been having a left-sided back pain described as "sharp" pains that last less than one second and self resolve for the past 2 months. She has been evaluated by her primary care physician, multiple emergency department visits, and an orthopedist and states "if they know what it is, they didn't tell me." She does have a history of compression fracture but is not sure where it is and the imaging is not in our system. Patient denies any new trauma, chest pain, lightheadedness or syncope. She reports a chronic cough with production of yellow phlegm which is completely unchanged compared to baseline. When the pain, "it takes my breath away," but she does not feel more short of breath than her baseline. She has been taking oxycodone for her pain with significant improvement but "I'm scared I will get addicted," so she waits until the pain is severe to take the next dose. No fever, chills, difficulty walking, numbness tingling or weakness.  My understanding is that the patient is in the emergency department today for help with how to manage her pain, but with no new or acute symptoms.   Past Medical History  Diagnosis Date  . Anemia   . Diabetes mellitus without complication (HCC)   . Bronchiectasis (HCC)   . Colon polyps   . Hypercholesteremia   . S/P AAA repair   . Right rotator cuff tear   . COPD (chronic obstructive pulmonary disease) (HCC)   . Low serum IgG1 and IgM levels   . Oxygen deficiency   .  Cataract     resolved with surgery  . Compression fracture of spine [facility] [place]) 05-31-15    Patient Active Problem List   Diagnosis Date Noted  . Weight loss 03/19/2015  . Scalp lesion 03/19/2015  . CAD (coronary artery disease) 11/30/2014  . Dizziness 07/01/2014  . Fatigue 07/01/2014  . Hypoxia 04/24/2014  . Adnexal mass 04/24/2014  . Tobacco abuse 02/07/2014  . Acute and chronic respiratory failure 01/13/2014  . GERD (gastroesophageal reflux disease) 01/13/2014  . Atrial fibrillation (HCC) 09/25/2013  . Edema 03/14/2013  . COPD (chronic obstructive pulmonary disease) (HCC) 07/01/2012  . Bronchiectasis (HCC) 07/01/2012  . Hypercholesterolemia 07/01/2012  . Anemia 07/01/2012  . History of colon polyps 07/01/2012  . Diabetes mellitus (HCC) 07/01/2012  . Osteoporosis 07/01/2012    Past Surgical History  Procedure Laterality Date  . Appendectomy    . Abdominal hysterectomy    . Abdominal aortic aneurysm repair    . Rotator cuff repair      Current Outpatient Rx  Name  Route  Sig  Dispense  Refill  . acetylcysteine (MUCOMYST) 20 % nebulizer solution   Nebulization   Take 2 mLs by nebulization 2 (two) times daily.         Marland Kitchen aspirin EC 81 MG tablet   Oral   Take 81 mg by mouth daily.         Marland Kitchen azithromycin (ZITHROMAX) 250 MG tablet   Oral   Take 500 mg by mouth 3 (  three) times a week. Reported on 07/18/2015         . cetirizine (ZYRTEC) 10 MG tablet   Oral   Take 10 mg by mouth daily.         . Cholecalciferol (VITAMIN D3) 2000 UNITS capsule   Oral   Take 2,000 Units by mouth daily.         Marland Kitchen esomeprazole (NEXIUM) 40 MG capsule   Oral   Take 40 mg by mouth daily before breakfast. Take 1 capsule twice a day         . fentaNYL (DURAGESIC - DOSED MCG/HR) 50 MCG/HR   Transdermal   Place 1 patch (50 mcg total) onto the skin every 3 (three) days.   2 patch   0   . Fluticasone-Salmeterol (ADVAIR) 250-50 MCG/DOSE AEPB   Inhalation   Inhale 1 puff into the  lungs every 12 (twelve) hours.         . furosemide (LASIX) 20 MG tablet   Oral   Take 20 mg by mouth every other day.         . Ipratropium-Albuterol (COMBIVENT RESPIMAT) 20-100 MCG/ACT AERS respimat   Inhalation   Inhale 1 puff into the lungs 4 (four) times daily.         . metFORMIN (GLUCOPHAGE) 500 MG tablet   Oral   Take 1 tablet (500 mg total) by mouth 2 (two) times daily with a meal. Two times a day with meal. Patient taking differently: Take 500 mg by mouth 2 (two) times daily with a meal.    180 tablet   3   . montelukast (SINGULAIR) 10 MG tablet   Oral   Take 10 mg by mouth daily.          . Multiple Vitamin (MULTIVITAMIN) tablet   Oral   Take 1 tablet by mouth daily.         Letta Pate DELICA LANCETS 33G MISC      Test sugars three times daily   100 each   11   . oxyCODONE-acetaminophen (PERCOCET) 7.5-325 MG tablet   Oral   Take 1 tablet by mouth every 4 (four) hours as needed for severe pain.   20 tablet   0   . predniSONE (DELTASONE) 20 MG tablet   Oral   Take 1 tablet (20 mg total) by mouth daily.   30 tablet   0   . predniSONE (DELTASONE) 20 MG tablet   Oral   Take 3 tablets (60 mg total) by mouth daily. Patient not taking: Reported on 07/18/2015   15 tablet   0   . rivaroxaban (XARELTO) 10 MG TABS tablet   Oral   Take 10 mg by mouth daily.         . sotalol (BETAPACE) 80 MG tablet   Oral   Take 160 mg by mouth 2 (two) times daily.          . theophylline (THEODUR) 200 MG 12 hr tablet   Oral   Take 200 mg by mouth 2 (two) times daily.           Allergies Advil; Ciprofloxacin; Clindamycin/lincomycin; Levaquin; Penicillins; Septra; Sulfa antibiotics; and Doxycycline  Family History  Problem Relation Age of Onset  . COPD Mother   . Liver cancer Sister   . Colon cancer Maternal Grandmother   . Diabetes Daughter     Social History Social History  Substance Use Topics  . Smoking status: Current Every Day  Smoker     Types: Cigarettes  . Smokeless tobacco: Never Used     Comment: Has decreased to about 5 a day  . Alcohol Use: No    Review of Systems Constitutional: No fever/chills. No lightheadedness or syncope. Eyes: No visual changes. ENT: No sore throat. Cardiovascular: Denies chest pain, palpitations. Respiratory: Positive chronic shortness of breath.  Has a chronic cough. Gastrointestinal: No abdominal pain.  No nausea, no vomiting.  No diarrhea.  No constipation. Genitourinary: Negative for dysuria. Musculoskeletal: Positive chronic back pain. Skin: Negative for rash. Neurological: Negative for headaches, focal weakness or numbness.  10-point ROS otherwise negative.  ____________________________________________   PHYSICAL EXAM:  VITAL SIGNS: ED Triage Vitals  Enc Vitals Group     BP 07/25/15 1633 130/71 mmHg     Pulse Rate 07/25/15 1633 82     Resp 07/25/15 1633 18     Temp 07/25/15 1633 97.5 F (36.4 C)     Temp Source 07/25/15 1633 Oral     SpO2 07/25/15 1627 97 %     Weight 07/25/15 1633 95 lb (43.092 kg)     Height 07/25/15 1633 5' (1.524 m)     Head Cir --      Peak Flow --      Pain Score 07/25/15 1638 10     Pain Loc --      Pain Edu? --      Excl. in GC? --     Constitutional: She is alert and oriented and answering questions appropriately. She is chronically ill-appearing.  Eyes: Conjunctivae are normal.  EOMI. Head: Atraumatic. Nose: No congestion/rhinnorhea. Mouth/Throat: Mucous membranes are moist.  Neck: No stridor.  Supple.  No midline C-spine tenderness, no step-offs or deformities. Cardiovascular: Normal rate, regular rhythm. No murmurs, rubs or gallops.  Respiratory: Patient is tachypnea with accessory muscle use and retractions and did not wear her home oxygen; her O2 off oxygen is 91%. She goes back up to 99% when I place her back on 2 L nasal cannula. She has diffuse back surgery greater than inspiratory wheezing and rales in the bases bilaterally. She  is able to speak in 4-5 word sentences. There is no reproducible palpable pain on the left side when I push on the area she states that is hurting her; no crepitus. Gastrointestinal: Soft and nontender. No distention. No peritoneal signs. Musculoskeletal: No LE edema.  Neurologic:  Normal speech and language. No gross focal neurologic deficits are appreciated.  Skin:  Skin is warm, dry and intact. No rash noted. Psychiatric: Mood and affect are normal. Speech and behavior are normal.  Normal judgement.  ____________________________________________   LABS (all labs ordered are listed, but only abnormal results are displayed)  Labs Reviewed - No data to display ____________________________________________  EKG  ED ECG REPORT I, Rockne MenghiniNorman, Anne-Caroline, the attending physician, personally viewed and interpreted this ECG.   Date: 07/25/2015  EKG Time: 1748  Rate: 82  Rhythm: normal sinus rhythm  Axis: Normal  Intervals:none  ST&T Change: Nonspecific T-wave inversions in V1. No ST elevation. No ischemic changes.  ____________________________________________  RADIOLOGY  Dg Chest 2 View  07/25/2015  CLINICAL DATA:  Shortness of breath. EXAM: CHEST  2 VIEW COMPARISON:  04/07/2014 and 06/19/2015 FINDINGS: Normal heart size. There is aortic atherosclerosis noted. There is an opacity within the right base corresponding to chronic right middle lobe atelectasis. Chronic interstitial coarsening is identified bilaterally. Diffuse bronchial wall thickening and hyperinflation noted. Multiple compression deformities are again noted.  There has been progression of T6 compression fracture. IMPRESSION: 1. Chronic right middle lobe atelectasis, similar to 06/12/15. 2. Chronic compression deformities involving the thoracic spine. Electronically Signed   By: Signa Kell M.D.   On: 07/25/2015 17:37    ____________________________________________   PROCEDURES  Procedure(s) performed: None  Critical  Care performed: No ____________________________________________   INITIAL IMPRESSION / ASSESSMENT AND PLAN / ED COURSE  Pertinent labs & imaging results that were available during my care of the patient were reviewed by me and considered in my medical decision making (see chart for details).  82 y.o. with end-stage COPD and chronic back pain presenting with back pain. After a long discussion with the patient and her daughter, who are both poor historians, Dareen Piano he is at the patient has no acute symptoms today. She is here because her pain is not well controlled but it is not different in character or severity. She has poor pulmonary status at baseline and this is likely unchanged according to the patient and her daughter. Given the abnormalities on my exam and the fact that she has pain, I'll get a chest x-ray to rule out pneumothorax, rib fracture from chronic cough, or any other acute cardiopulmonary abnormalities. We will also get a screening EKG. I believe the patient would benefit from a constant opioid rather then intermittent tablets that she does not take until she has severe pain so I have recommended a fentanyl patch for her. She will need to follow-up with her primary care physician for this but I will give her prescription for the first 2 patches so that she can initiate a trial and give her primary care physician feedback. If her workup here is stable for her, anticipate discharge home.  ----------------------------------------- 6:16 PM on 07/25/2015 -----------------------------------------  Patient's chest x-ray does not show any acute changes. Her EKG does not show any ischemic changes. She is feeling better after oxycodone and I will discharge her home as stated above. Plan discharge  ____________________________________________  FINAL CLINICAL IMPRESSION(S) / ED DIAGNOSES  Final diagnoses:  Chronic back pain  Pulmonary emphysema, unspecified emphysema type (HCC)       NEW MEDICATIONS STARTED DURING THIS VISIT:  New Prescriptions   FENTANYL (DURAGESIC - DOSED MCG/HR) 50 MCG/HR    Place 1 patch (50 mcg total) onto the skin every 3 (three) days.     Rockne Menghini, MD 07/25/15 [phone]

## 2015-07-25 NOTE — Telephone Encounter (Signed)
Patient Name: Carla Harris  DOB: 12/11/1928    Initial Comment Caller States she is having hard to breathing, could here she was struggling for a breath call got disconnected    Nurse Assessment  Nurse: Renaldo FiddlerAdkins, RN, Raynelle FanningJulie Date/Time (Eastern Time): 07/25/2015 3:34:16 PM  Confirm and document reason for call. If symptomatic, describe symptoms. ---Caller states she is having trouble breathing . I can hear sob and wheezing, her son is present. Her O2 is in place.  Has the patient traveled out of the country within the last 30 days? ---Not Applicable  Does the patient have any new or worsening symptoms? ---Yes  Will a triage be completed? ---Yes  Related visit to physician within the last 2 weeks? ---Yes  Does the PT have any chronic conditions? (i.e. diabetes, asthma, etc.) ---Yes  List chronic conditions. ---COPD,  Is this a behavioral health or substance abuse call? ---No     Guidelines    Guideline Title Affirmed Question Affirmed Notes  Cough - Chronic Sounds like a life-threatening emergency to the triager    Final Disposition User   Call EMS 911 Now Renaldo FiddlerAdkins, RN, Raynelle FanningJulie    Disagree/Comply: Comply

## 2015-07-25 NOTE — ED Notes (Signed)
Pt verbalized understanding of discharge instructions. NAD at this time. 

## 2015-07-25 NOTE — Discharge Instructions (Signed)
Please place the fentanyl patch on your back and take oxycodone only for breakthrough pain. Please make a follow-up appointment with your primary care doctor to give feedback about how this patches working for your pain.  Please return to the emergency department if he develops severe pain, numbness tingling or weakness, shortness of breath, chest pain, fainting, fever or any other symptoms concerning to you.

## 2015-07-26 ENCOUNTER — Telehealth: Payer: Self-pay | Admitting: Internal Medicine

## 2015-07-26 ENCOUNTER — Telehealth: Payer: Self-pay | Admitting: *Deleted

## 2015-07-26 NOTE — Telephone Encounter (Signed)
Patient's daughter was informed of the response, however she requested her mother be seen only by Dr. Lorin PicketScott Thursday or Friday . Please call patients back at [phone]

## 2015-07-26 NOTE — Telephone Encounter (Signed)
Please advise on appt date and time, or see another provider?

## 2015-07-26 NOTE — Telephone Encounter (Signed)
I can see her Friday at 4:00 (07/28/15).  May have to wait if I am running behind.

## 2015-07-26 NOTE — Telephone Encounter (Signed)
Scheduled appt.  Spoke with daughter.

## 2015-07-26 NOTE — Telephone Encounter (Signed)
Pt called to make an appointment with Dr. Lorin PicketScott. She was in the ER yesterday and needs a 3 day follow-up. Dr. Lorin PicketScott had an 8 am appt on the 17th but pt did not want that. I also asked if she would be willing to see someone else. Pt said she had to see Dr. Lorin PicketScott. Please call pt at home number.

## 2015-07-28 ENCOUNTER — Ambulatory Visit (INDEPENDENT_AMBULATORY_CARE_PROVIDER_SITE_OTHER): Payer: Medicare Other | Admitting: Internal Medicine

## 2015-07-28 ENCOUNTER — Encounter: Payer: Self-pay | Admitting: Internal Medicine

## 2015-07-28 VITALS — BP 120/78 | HR 78 | Temp 97.7°F | Resp 18 | Ht 62.0 in | Wt 97.5 lb

## 2015-07-28 DIAGNOSIS — N949 Unspecified condition associated with female genital organs and menstrual cycle: Secondary | ICD-10-CM | POA: Diagnosis not present

## 2015-07-28 DIAGNOSIS — Z720 Tobacco use: Secondary | ICD-10-CM | POA: Diagnosis not present

## 2015-07-28 DIAGNOSIS — R609 Edema, unspecified: Secondary | ICD-10-CM | POA: Diagnosis not present

## 2015-07-28 DIAGNOSIS — M546 Pain in thoracic spine: Secondary | ICD-10-CM | POA: Diagnosis not present

## 2015-07-28 DIAGNOSIS — J449 Chronic obstructive pulmonary disease, unspecified: Secondary | ICD-10-CM

## 2015-07-28 DIAGNOSIS — E119 Type 2 diabetes mellitus without complications: Secondary | ICD-10-CM

## 2015-07-28 DIAGNOSIS — N9489 Other specified conditions associated with female genital organs and menstrual cycle: Secondary | ICD-10-CM

## 2015-07-28 DIAGNOSIS — R634 Abnormal weight loss: Secondary | ICD-10-CM

## 2015-07-28 MED ORDER — FENTANYL 50 MCG/HR TD PT72: 50.0000 ug | MEDICATED_PATCH | TRANSDERMAL | Status: AC

## 2015-07-28 MED ORDER — OXYCODONE-ACETAMINOPHEN 5-325 MG PO TABS: 1.0000 | ORAL_TABLET | Freq: Four times a day (QID) | ORAL | Status: AC | PRN

## 2015-07-28 NOTE — Progress Notes (Signed)
Pre-visit discussion using our clinic review tool. No additional management support is needed unless otherwise documented below in the visit note.  

## 2015-07-28 NOTE — Progress Notes (Signed)
Patient ID: Carla Harris, female   DOB: 09/22/1928, 82 y.o.   MRN: [id number]   Subjective:    Patient ID: Carla Harris, female    DOB: 27-Sep-1928, 82 y.o.   MRN: [id number]  HPI  Patient with past history of diabetes, anemia, COPD and hypercholesterolemia.  She comes in today as a work in for ER follow up.  Was seen with increased back pain.  See ER note for details.  Has compression fracture - progression.  Pain localized to mid back.  Catches at times. On fentanyl patch.  Oxycodone prn.  Is helping some, but she is not taking every 6 hours.  Waits until has bad pain.  Still with sob.  States if she moves - sob.  Some chronic cough and congestion.  Eating.  No nausea or vomiting.  No abdominal pain or cramping.  Discussed decreased bowel movement with pain medication.     Past Medical History  Diagnosis Date  . Anemia   . Diabetes mellitus without complication (HCC)   . Bronchiectasis (HCC)   . Colon polyps   . Hypercholesteremia   . S/P AAA repair   . Right rotator cuff tear   . COPD (chronic obstructive pulmonary disease) (HCC)   . Low serum IgG1 and IgM levels   . Oxygen deficiency   . Cataract     resolved with surgery  . Compression fracture of spine (HCC) 05-31-15   Past Surgical History  Procedure Laterality Date  . Appendectomy    . Abdominal hysterectomy    . Abdominal aortic aneurysm repair    . Rotator cuff repair     Family History  Problem Relation Age of Onset  . COPD Mother   . Liver cancer Sister   . Colon cancer Maternal Grandmother   . Diabetes Daughter    Social History   Social History  . Marital Status: Widowed    Spouse Name: N/A  . Number of Children: N/A  . Years of Education: N/A   Social History Main Topics  . Smoking status: Current Every Day Smoker    Types: Cigarettes  . Smokeless tobacco: Never Used     Comment: Has decreased to about 5 a day  . Alcohol Use: No  . Drug Use: No  . Sexual Activity: Not Asked   Other Topics  Concern  . None   Social History Narrative    Outpatient Encounter Prescriptions as of 07/28/2015  Medication Sig  . acetylcysteine (MUCOMYST) 20 % nebulizer solution Take 2 mLs by nebulization 2 (two) times daily.  Marland Kitchen aspirin EC 81 MG tablet Take 81 mg by mouth daily.  Marland Kitchen azithromycin (ZITHROMAX) 250 MG tablet Take 500 mg by mouth 3 (three) times a week. Reported on 07/18/2015  . cetirizine (ZYRTEC) 10 MG tablet Take 10 mg by mouth daily.  . Cholecalciferol (VITAMIN D3) 2000 UNITS capsule Take 2,000 Units by mouth daily.  Marland Kitchen esomeprazole (NEXIUM) 40 MG capsule Take 40 mg by mouth daily before breakfast. Take 1 capsule twice a day  . fentaNYL (DURAGESIC - DOSED MCG/HR) 50 MCG/HR Place 1 patch (50 mcg total) onto the skin every 3 (three) days.  . Fluticasone-Salmeterol (ADVAIR) 250-50 MCG/DOSE AEPB Inhale 1 puff into the lungs every 12 (twelve) hours.  . furosemide (LASIX) 20 MG tablet Take 20 mg by mouth every other day.  Marland Kitchen HYDROcodone-acetaminophen (NORCO) 7.5-325 MG tablet   . Ipratropium-Albuterol (COMBIVENT RESPIMAT) 20-100 MCG/ACT AERS respimat Inhale 1 puff into the lungs 4 (  four) times daily.  . metFORMIN (GLUCOPHAGE) 500 MG tablet Take 1 tablet (500 mg total) by mouth 2 (two) times daily with a meal. Two times a day with meal. (Patient taking differently: Take 500 mg by mouth 2 (two) times daily with a meal. )  . montelukast (SINGULAIR) 10 MG tablet Take 10 mg by mouth daily.   . Multiple Vitamin (MULTIVITAMIN) tablet Take 1 tablet by mouth daily.  Letta Pate DELICA LANCETS 33G MISC Test sugars three times daily  . predniSONE (DELTASONE) 20 MG tablet Take 1 tablet (20 mg total) by mouth daily.  . rivaroxaban (XARELTO) 10 MG TABS tablet Take 10 mg by mouth daily.  . sotalol (BETAPACE) 80 MG tablet Take 160 mg by mouth 2 (two) times daily.   . theophylline (THEODUR) 200 MG 12 hr tablet Take 200 mg by mouth 2 (two) times daily.  . [DISCONTINUED] fentaNYL (DURAGESIC - DOSED MCG/HR) 50 MCG/HR  Place 1 patch (50 mcg total) onto the skin every 3 (three) days.  . [DISCONTINUED] oxyCODONE-acetaminophen (PERCOCET) 7.5-325 MG tablet Take 1 tablet by mouth every 4 (four) hours as needed for severe pain.  . [DISCONTINUED] predniSONE (DELTASONE) 20 MG tablet Take 3 tablets (60 mg total) by mouth daily.  Marland Kitchen oxyCODONE-acetaminophen (ROXICET) 5-325 MG tablet Take 1 tablet by mouth every 6 (six) hours as needed for severe pain.   No facility-administered encounter medications on file as of 07/28/2015.    Review of Systems  Constitutional: Negative for appetite change and unexpected weight change.  HENT: Positive for congestion. Negative for sinus pressure.   Eyes: Negative for discharge and redness.  Respiratory: Positive for cough, shortness of breath and wheezing. Negative for chest tightness.   Cardiovascular: Negative for chest pain and palpitations.  Gastrointestinal: Negative for nausea, vomiting, abdominal pain and diarrhea.  Genitourinary: Negative for dysuria and difficulty urinating.  Musculoskeletal: Positive for back pain. Negative for joint swelling.  Skin: Negative for color change and rash.  Neurological: Negative for dizziness, light-headedness and headaches.  Psychiatric/Behavioral: Negative for dysphoric mood and agitation.       Objective:    Physical Exam  HENT:  Nose: Nose normal.  Mouth/Throat: Oropharynx is clear and moist.  Eyes: Conjunctivae are normal. Right eye exhibits no discharge. Left eye exhibits no discharge.  Neck: Neck supple.  Cardiovascular: Normal rate and regular rhythm.   Pulmonary/Chest:  Increased cough with expiration.  Some scattered wheezes.    Abdominal: Soft. Bowel sounds are normal. There is no tenderness.  Musculoskeletal: She exhibits no tenderness.  Pedal edema and ankle edema.  Back pain localized to mid right thoracic region.   Lymphadenopathy:    She has no cervical adenopathy.  Skin: Skin is warm and dry. No erythema.    Psychiatric: She has a normal mood and affect. Her behavior is normal.    BP 120/78 mmHg  Pulse 78  Temp(Src) 97.7 F (36.5 C) (Oral)  Resp 18  Ht 5\' 2"  (1.575 m)  Wt 97 lb 8 oz (44.226 kg)  BMI 17.83 kg/m2  SpO2 94%  LMP 07/01/1969 Wt Readings from Last 3 Encounters:  07/28/15 97 lb 8 oz (44.226 kg)  07/25/15 95 lb (43.092 kg)  07/18/15 95 lb (43.092 kg)     Lab Results  Component Value Date   WBC 12.6* 06/12/2015   HGB 12.7 06/12/2015   HCT 39.8 06/12/2015   PLT 288 06/12/2015   GLUCOSE 180* 06/19/2015   CHOL 225* 03/17/2015   TRIG 206.0* 03/17/2015  HDL 44.40 03/17/2015   LDLDIRECT 150.0 03/17/2015   LDLCALC 169* 07/01/2014   ALT 7* 06/19/2015   AST 10* 06/19/2015   NA 136 06/19/2015   K 4.6 06/19/2015   CL 102 06/19/2015   CREATININE 0.54 06/19/2015   BUN 15 06/19/2015   CO2 26 06/19/2015   TSH 0.82 03/17/2015   INR 1.22 06/12/2015   HGBA1C 7.6* 03/17/2015   MICROALBUR 0.3 12/23/2012    Dg Chest 2 View  07/25/2015  CLINICAL DATA:  Shortness of breath. EXAM: CHEST  2 VIEW COMPARISON:  04/07/2014 and 06/19/2015 FINDINGS: Normal heart size. There is aortic atherosclerosis noted. There is an opacity within the right base corresponding to chronic right middle lobe atelectasis. Chronic interstitial coarsening is identified bilaterally. Diffuse bronchial wall thickening and hyperinflation noted. Multiple compression deformities are again noted. There has been progression of T6 compression fracture. IMPRESSION: 1. Chronic right middle lobe atelectasis, similar to 06/12/15. 2. Chronic compression deformities involving the thoracic spine. Electronically Signed   By: Signa Kellaylor  Stroud M.D.   On: 07/25/2015 17:37       Assessment & Plan:   Problem List Items Addressed This Visit    Adnexal mass    Had an adnexal mass noted on CT.  Has declined further w/up.        Back pain - Primary    Persistent increased back pain.  See ER note.  Compression fracture.  Will  continue fentanyl patch.  Refilled oxycodone 5mg .  Refer to Dr Yves Dillhasnis to help with pain control.  Discussed taking stool softener or miralax to keep bowels moving.        Relevant Medications   HYDROcodone-acetaminophen (NORCO) 7.5-325 MG tablet   oxyCODONE-acetaminophen (ROXICET) 5-325 MG tablet   fentaNYL (DURAGESIC - DOSED MCG/HR) 50 MCG/HR   Other Relevant Orders   Ambulatory referral to Orthopedic Surgery   COPD (chronic obstructive pulmonary disease) (HCC)    Has severe COPD.  Followed by Dr Meredeth IdeFleming.  On chronic steroids.  No active infection.  On oxygen.  Declines hospitalization.  Follow.        Diabetes mellitus (HCC)    Brought in no sugar readings.  Follow.        Edema    Pedal and ankle edema.  Elevate legs.  Follow.  Compression hose.        Tobacco abuse    Have discussed the need for quitting.  She continues to smoke.        Weight loss    Weight stable from the last couple of checks.  Encourage increased po intake.            Dale DurhamSCOTT, Jasmain Ahlberg, MD

## 2015-07-30 ENCOUNTER — Encounter: Payer: Self-pay | Admitting: Internal Medicine

## 2015-07-30 DIAGNOSIS — M549 Dorsalgia, unspecified: Secondary | ICD-10-CM | POA: Insufficient documentation

## 2015-07-30 NOTE — Assessment & Plan Note (Signed)
Had an adnexal mass noted on CT.  Has declined further w/up.

## 2015-07-30 NOTE — Assessment & Plan Note (Signed)
Pedal and ankle edema.  Elevate legs.  Follow.  Compression hose.

## 2015-07-30 NOTE — Assessment & Plan Note (Signed)
Has severe COPD.  Followed by Dr Meredeth IdeFleming.  On chronic steroids.  No active infection.  On oxygen.  Declines hospitalization.  Follow.

## 2015-07-30 NOTE — Assessment & Plan Note (Signed)
Weight stable from the last couple of checks.  Encourage increased po intake.

## 2015-07-30 NOTE — Assessment & Plan Note (Signed)
Brought in no sugar readings.  Follow.

## 2015-07-30 NOTE — Assessment & Plan Note (Signed)
Have discussed the need for quitting.  She continues to smoke.

## 2015-07-30 NOTE — Assessment & Plan Note (Addendum)
Persistent increased back pain.  See ER note.  Compression fracture.  Will continue fentanyl patch.  Refilled oxycodone 5mg .  Refer to Dr Yves Dillhasnis to help with pain control.  Discussed taking stool softener or miralax to keep bowels moving.

## 2015-07-31 NOTE — Telephone Encounter (Signed)
Patient was seen in ED for SOB.

## 2015-08-01 ENCOUNTER — Telehealth: Payer: Self-pay | Admitting: Internal Medicine

## 2015-08-01 ENCOUNTER — Other Ambulatory Visit: Payer: Self-pay

## 2015-08-01 NOTE — Telephone Encounter (Signed)
Pt called needing a prescription for metFORMIN (GLUCOPHAGE) 500 MG tablet, furosemide (LASIX) 20 MG tablet. Pharmacy is [facility] [phone]. Call pt @ [phone]. Thank You!

## 2015-08-17 ENCOUNTER — Telehealth: Payer: Self-pay | Admitting: *Deleted

## 2015-08-17 ENCOUNTER — Other Ambulatory Visit: Payer: Self-pay | Admitting: Internal Medicine

## 2015-08-17 ENCOUNTER — Ambulatory Visit: Payer: Self-pay | Admitting: *Deleted

## 2015-08-17 DIAGNOSIS — J449 Chronic obstructive pulmonary disease, unspecified: Secondary | ICD-10-CM

## 2015-08-17 NOTE — Progress Notes (Signed)
Order placed for Hospice referral.  Per staff message sent to me from [facility]

## 2015-08-17 NOTE — Telephone Encounter (Signed)
-----   Message from Dale Otis, MD sent at 08/17/2015  1:37 PM EST ----- I have placed the order for hospice.  Is there any way to put this message in her actual chart.  Also, I need to let her know that I have placed the order for the referral.  I can send this message to her if ou can put the message in the chart.  Thanks    Dr Lorin Picket ----- Message -----    From: Theadora Rama Minor, RN    Sent: 08/17/2015  10:17 AM      To: Dale [place], MD  Dr. Lorin Picket,   Carla Harris has agreed to a Hospice referral. She is still in severe pain and is congested and SOB. Her son is getting a little worn out as her caregiver so she states she is doing it more fore him than her.    If there is anything I can do to help with this let me know.  Thank you,   Janci Minor RN, BSN  [facility] [phone])

## 2015-08-21 NOTE — Telephone Encounter (Signed)
Update, on 08/20/2015 Erie Noe with [facility] [phone] called to let the office know that patient was admitted to hospice services.  FYI call only.  Call went to [facility].

## 2015-08-28 ENCOUNTER — Other Ambulatory Visit: Payer: Self-pay | Admitting: *Deleted

## 2015-08-28 ENCOUNTER — Telehealth: Payer: Self-pay | Admitting: *Deleted

## 2015-08-28 NOTE — Telephone Encounter (Signed)
Ok to check urinalysis and culture.  Pt with hospice.

## 2015-08-28 NOTE — Telephone Encounter (Signed)
[facility] has requested a order for a UTi. She stated that patient had 2 falls through out the night. She would like to rule out the Uti. Please advise Erie Noe contact   [phone]

## 2015-08-28 NOTE — Patient Outreach (Signed)
RNCM noted in pt chart pt accepted Hospice care with [facility] per our discussion at last visit.  Plan: RNCM will close pt's case to care management at this time.    Costella Hatcher RN, BSN  [facility] Management [phone])

## 2015-08-28 NOTE — Telephone Encounter (Signed)
Spoke with Erie Noe from [employer].  Patient has increased confusion, weak and tired, has not eaten since Friday.  They placed a catheter this AM and got out immediately.  Patient had not urinated in 24hours, they are going to leave catheter in at this time.  [facility] is going to contact the office regarding a FL2 form for respite care for the family as they are very overwhelmed with her care this past weekend.  Just wanted you to let know her status.

## 2015-08-28 NOTE — Telephone Encounter (Signed)
Called Carla Harris to clarify that family was ok with current situation - with pt remaining at home and not being transferred to ER.  States has talked with family and they are comfortable with situation currently.  Will check urine.  Will sign FL2 when ready.  Informed Carla Harris to notify me if anything more was needed.

## 2015-08-28 NOTE — Telephone Encounter (Signed)
Please advise for order. thanks

## 2015-08-30 ENCOUNTER — Encounter: Payer: Self-pay | Admitting: Internal Medicine

## 2015-08-30 ENCOUNTER — Telehealth: Payer: Self-pay | Admitting: Internal Medicine

## 2015-08-30 NOTE — Telephone Encounter (Signed)
Vanessa [phone] called from [facility] regarding pt passing away at 11:38pm. Thank you!

## 2015-08-31 NOTE — Telephone Encounter (Signed)
[facility] dropped off Pt's Death Certificate to be signed. Certificate in Dr. Roby Lofts box.

## 2015-08-31 NOTE — Telephone Encounter (Addendum)
Noted. Awaiting physicians signature.

## 2015-09-01 NOTE — Telephone Encounter (Signed)
Form completed & [facility] notified. Certifiiate placed up front for pick up & copy sent to scan.

## 2015-09-13 DEATH — deceased

## 2016-12-19 IMAGING — CT CT ANGIO CHEST
1 of 2 series · 18 of 30 positions shown · IV contrast (omnipaque)
Comparison: Portable chest dated 01/06/2015 and chest CTA dated
01/12/2014.

CLINICAL DATA: Posterior right chest pain in the region of the
scapula. Chronic shortness breath.

EXAM:
CT ANGIOGRAPHY CHEST WITH CONTRAST
TECHNIQUE: Multidetector CT imaging of the chest was performed using the
standard protocol during bolus administration of intravenous
contrast. Multiplanar CT image reconstructions and MIPs were
obtained to evaluate the vascular anatomy.
CONTRAST:  75mL OMNIPAQUE IOHEXOL 350 MG/ML SOLN

[Series 7: pe 1.0 thins · axial · 0.68mm/px · z∈[-791,-515]mm · 18 of 312 slices shown]
[im 18/312  lung]
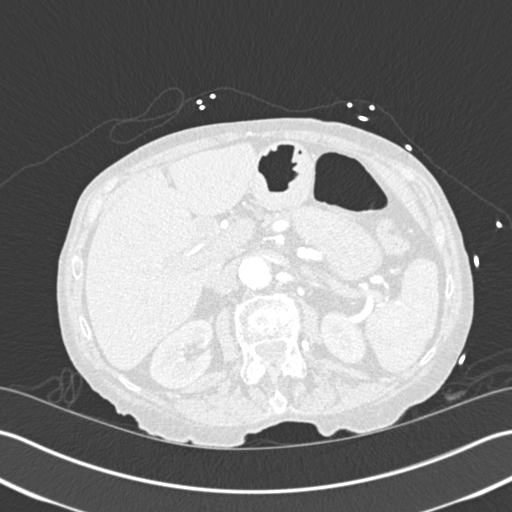
[im 35/312  mediastinal]
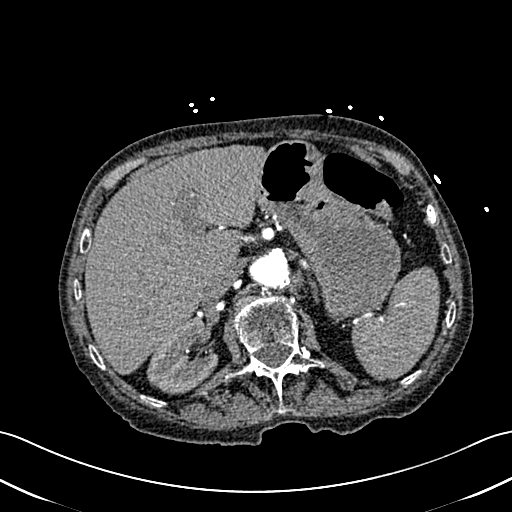
[im 52/312  lung]
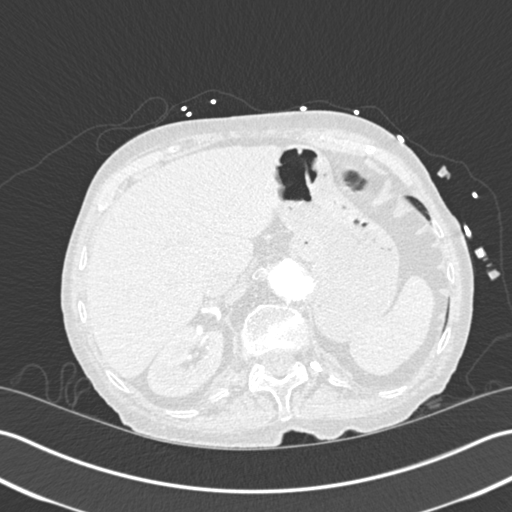
[im 70/312  mediastinal]
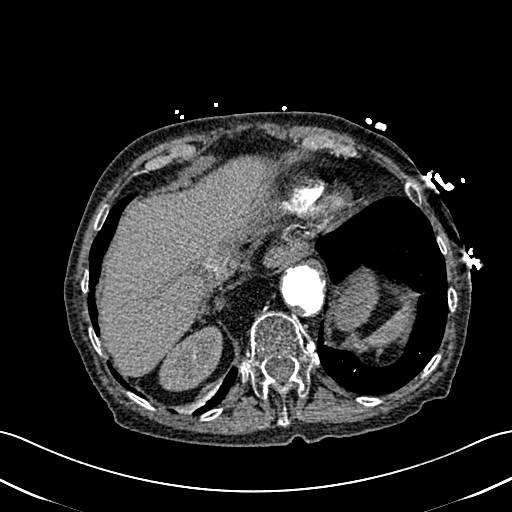
[im 87/312  lung]
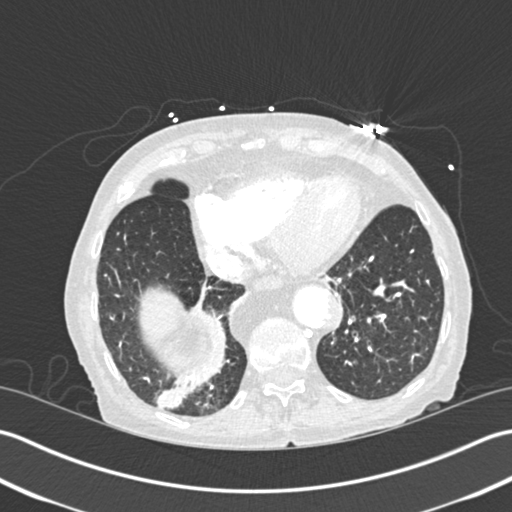
[im 104/312  mediastinal]
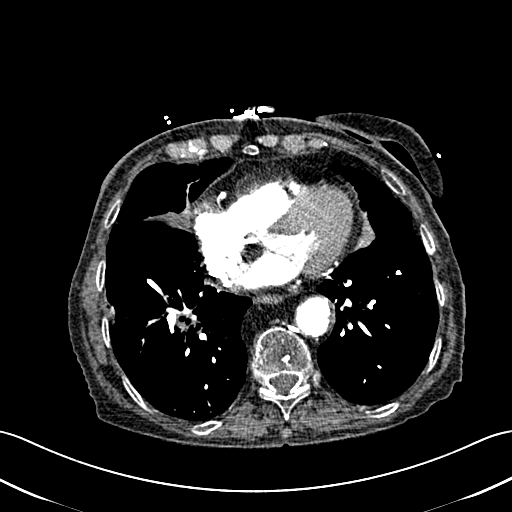
[im 121/312  lung]
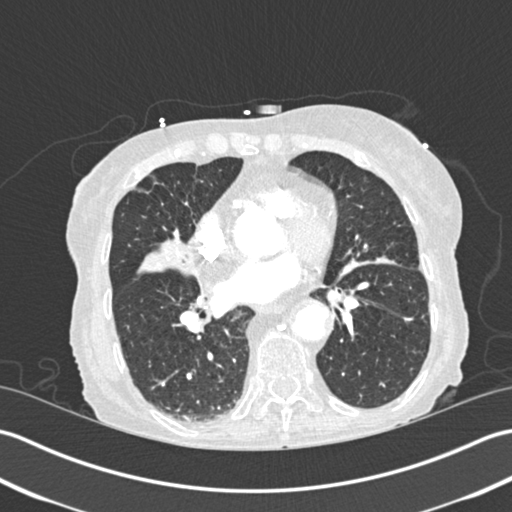
[im 139/312  mediastinal]
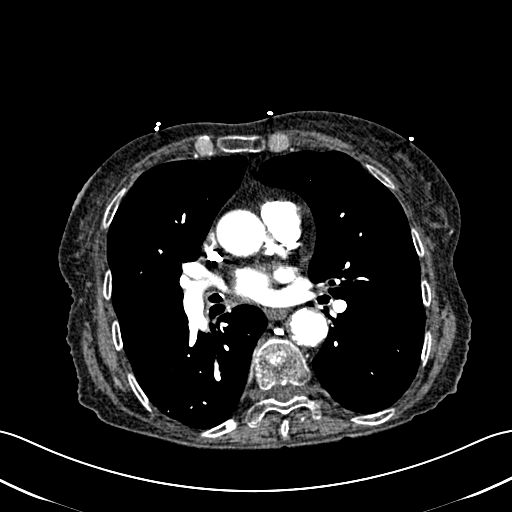
[im 147/312  lung]
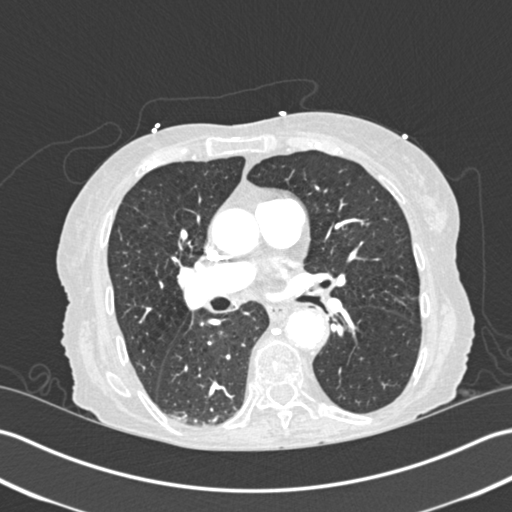
[im 156/312  mediastinal]
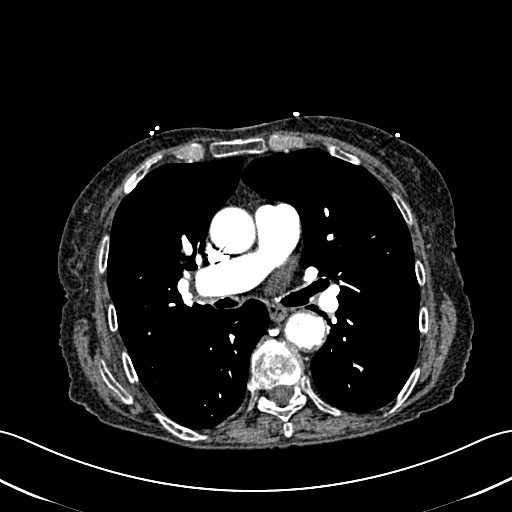
[im 173/312  lung]
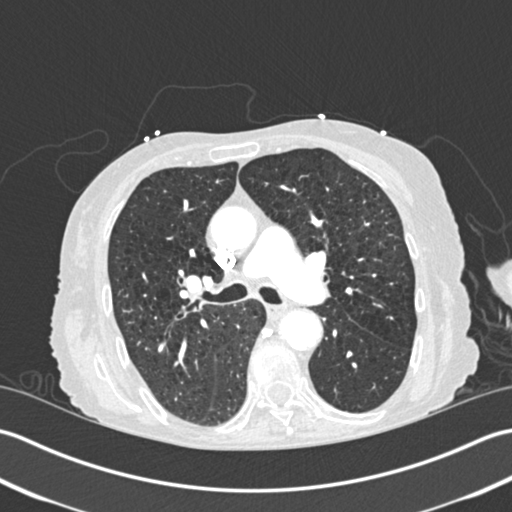
[im 191/312  mediastinal]
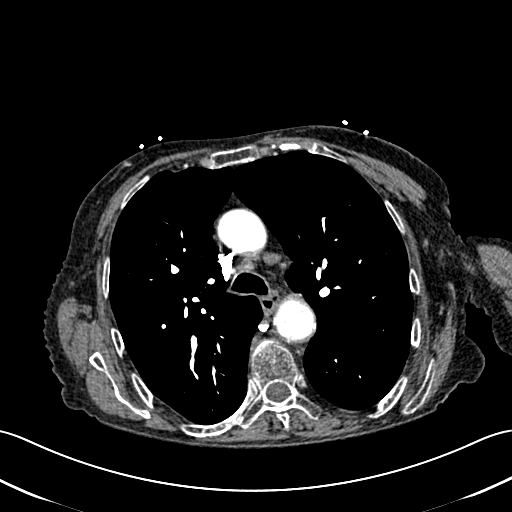
[im 208/312  lung]
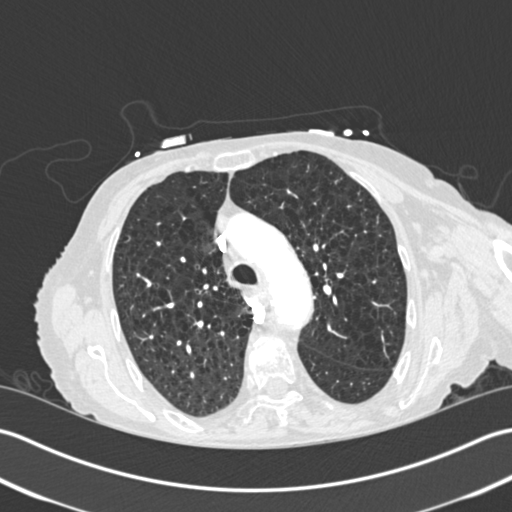
[im 225/312  mediastinal]
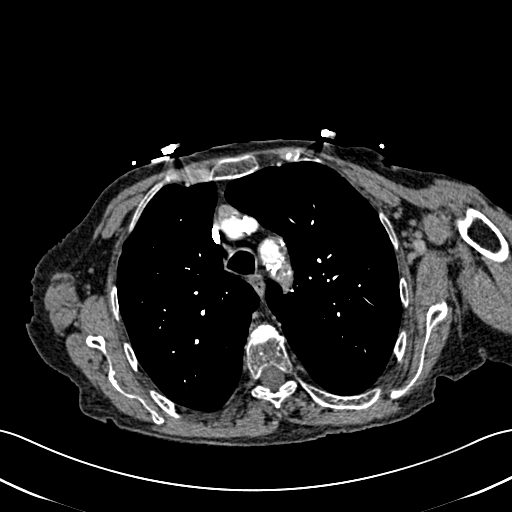
[im 242/312  lung]
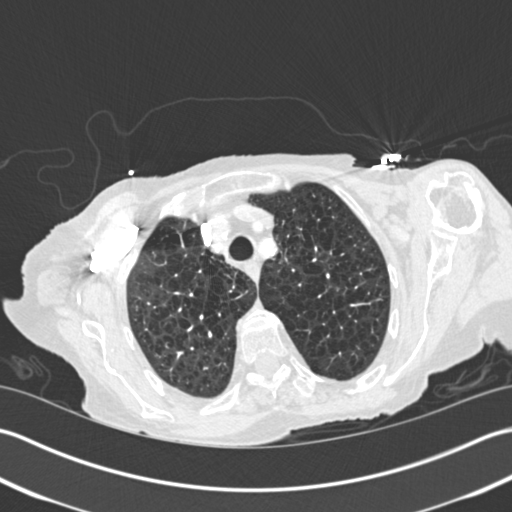
[im 260/312  mediastinal]
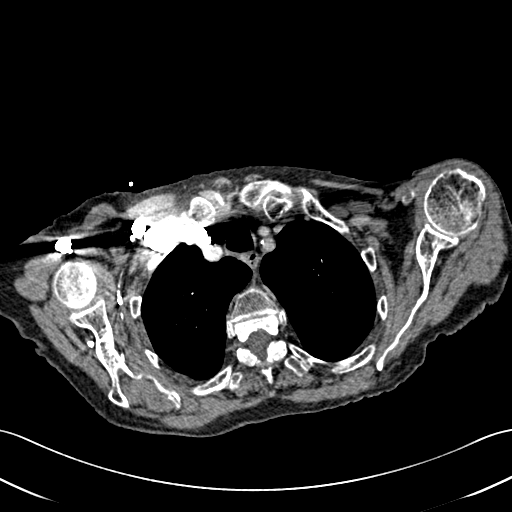
[im 277/312  lung]
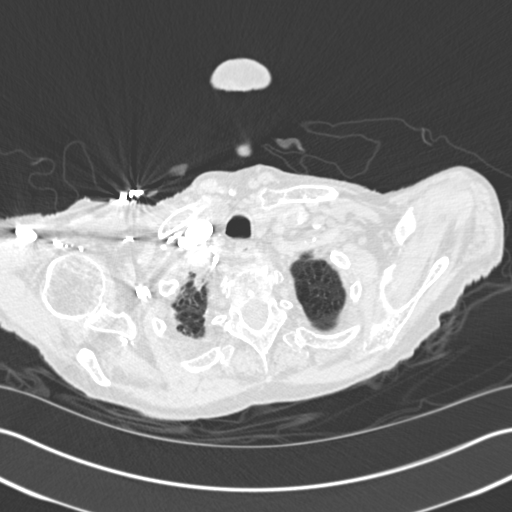
[im 294/312  mediastinal]
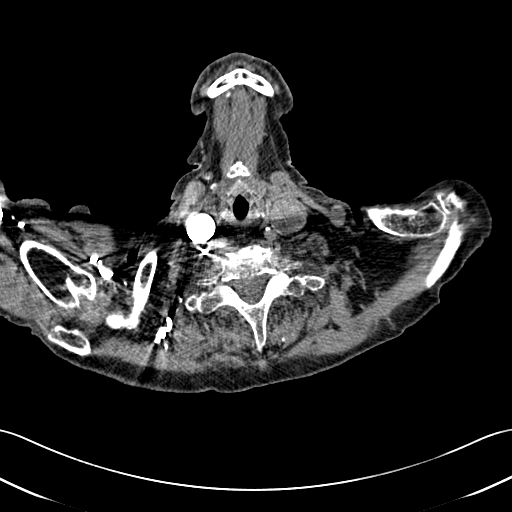

[18 of 30 positions shown; findings below may reference images not displayed]

FINDINGS: Mediastinum/Lymph Nodes: No pulmonary emboli or thoracic aortic
dissection identified. No masses or pathologically enlarged lymph
nodes identified.

Lungs/Pleura: The lungs are hyperexpanded with extensive bullous
changes, most pronounced in the upper lobes. Linear atelectasis in
both lower lung zones. No lung nodules.

Upper abdomen: Possible partially included gallstone in the
gallbladder on the last image.

Musculoskeletal: Interval 30% compression deformities involving the
T3, T6 and T9 vertebral bodies. No bony retropulsion or acute
fracture lines. Mild thoracic spine degenerative changes.

Review of the MIP images confirms the above findings.
IMPRESSION: 1. No pulmonary emboli.
2. Bibasilar linear atelectasis.
3. Possible partially included gallstone in the gallbladder.
